# Patient Record
Sex: Male | Born: 1945 | Race: White | Hispanic: No | Marital: Married | Smoking: Never smoker
Health system: Southern US, Community
[De-identification: ages and names within clinical notes are randomized; demographics above are authoritative.]

## PROBLEM LIST (undated history)

## (undated) DIAGNOSIS — F419 Anxiety disorder, unspecified: Secondary | ICD-10-CM

## (undated) DIAGNOSIS — R200 Anesthesia of skin: Secondary | ICD-10-CM

## (undated) DIAGNOSIS — N529 Male erectile dysfunction, unspecified: Secondary | ICD-10-CM

## (undated) DIAGNOSIS — I10 Essential (primary) hypertension: Secondary | ICD-10-CM

## (undated) DIAGNOSIS — K449 Diaphragmatic hernia without obstruction or gangrene: Secondary | ICD-10-CM

## (undated) DIAGNOSIS — I2510 Atherosclerotic heart disease of native coronary artery without angina pectoris: Secondary | ICD-10-CM

## (undated) DIAGNOSIS — E785 Hyperlipidemia, unspecified: Secondary | ICD-10-CM

## (undated) DIAGNOSIS — M1990 Unspecified osteoarthritis, unspecified site: Secondary | ICD-10-CM

## (undated) HISTORY — DX: Hyperlipidemia, unspecified: E78.5

## (undated) HISTORY — DX: Unspecified osteoarthritis, unspecified site: M19.90

## (undated) HISTORY — DX: Atherosclerotic heart disease of native coronary artery without angina pectoris: I25.10

## (undated) HISTORY — DX: Anesthesia of skin: R20.0

## (undated) HISTORY — DX: Essential (primary) hypertension: I10

## (undated) HISTORY — PX: CARDIAC CATHETERIZATION: SHX172

## (undated) HISTORY — PX: NOSE SURGERY: SHX723

## (undated) HISTORY — DX: Diaphragmatic hernia without obstruction or gangrene: K44.9

## (undated) HISTORY — DX: Male erectile dysfunction, unspecified: N52.9

## (undated) HISTORY — DX: Anxiety disorder, unspecified: F41.9

---

## 1999-01-19 ENCOUNTER — Ambulatory Visit (HOSPITAL_COMMUNITY): Admission: RE | Admit: 1999-01-19 | Discharge: 1999-01-20 | Payer: Self-pay | Admitting: Cardiovascular Disease

## 1999-02-11 ENCOUNTER — Inpatient Hospital Stay (HOSPITAL_COMMUNITY): Admission: EM | Admit: 1999-02-11 | Discharge: 1999-02-12 | Payer: Self-pay | Admitting: Cardiology

## 2003-12-19 ENCOUNTER — Ambulatory Visit (HOSPITAL_COMMUNITY): Admission: RE | Admit: 2003-12-19 | Discharge: 2003-12-19 | Payer: Self-pay | Admitting: Cardiology

## 2004-01-19 ENCOUNTER — Ambulatory Visit (HOSPITAL_COMMUNITY): Admission: RE | Admit: 2004-01-19 | Discharge: 2004-01-19 | Payer: Self-pay | Admitting: Cardiology

## 2004-06-07 ENCOUNTER — Ambulatory Visit: Payer: Self-pay | Admitting: Internal Medicine

## 2005-01-12 ENCOUNTER — Ambulatory Visit: Payer: Self-pay | Admitting: Cardiology

## 2006-01-13 ENCOUNTER — Ambulatory Visit: Payer: Self-pay | Admitting: Cardiology

## 2006-11-30 ENCOUNTER — Ambulatory Visit: Payer: Self-pay | Admitting: Cardiology

## 2006-11-30 LAB — CONVERTED CEMR LAB
ALT: 21 units/L (ref 0–53)
AST: 23 units/L (ref 0–37)
Albumin: 3.9 g/dL (ref 3.5–5.2)
Alkaline Phosphatase: 70 units/L (ref 39–117)
Bilirubin, Direct: 0.2 mg/dL (ref 0.0–0.3)
Cholesterol: 146 mg/dL (ref 0–200)
HDL: 42.8 mg/dL (ref >39.0)
LDL Cholesterol: 75 mg/dL (ref 0–99)
Total Bilirubin: 1.5 mg/dL — ABNORMAL HIGH (ref 0.3–1.2)
Total CHOL/HDL Ratio: 3.4
Total Protein: 6.5 g/dL (ref 6.0–8.3)
Triglycerides: 142 mg/dL (ref 0–149)
VLDL: 28 mg/dL (ref 0–40)

## 2007-01-15 ENCOUNTER — Encounter: Admission: RE | Admit: 2007-01-15 | Discharge: 2007-01-15 | Payer: Self-pay | Admitting: Internal Medicine

## 2007-01-15 ENCOUNTER — Ambulatory Visit: Payer: Self-pay | Admitting: Cardiology

## 2008-01-25 ENCOUNTER — Ambulatory Visit: Payer: Self-pay | Admitting: Cardiology

## 2008-02-01 ENCOUNTER — Ambulatory Visit: Payer: Self-pay

## 2009-01-28 DIAGNOSIS — E785 Hyperlipidemia, unspecified: Secondary | ICD-10-CM | POA: Insufficient documentation

## 2009-01-28 DIAGNOSIS — I10 Essential (primary) hypertension: Secondary | ICD-10-CM | POA: Insufficient documentation

## 2009-01-28 DIAGNOSIS — I2510 Atherosclerotic heart disease of native coronary artery without angina pectoris: Secondary | ICD-10-CM | POA: Insufficient documentation

## 2009-01-28 DIAGNOSIS — K449 Diaphragmatic hernia without obstruction or gangrene: Secondary | ICD-10-CM | POA: Insufficient documentation

## 2009-01-30 ENCOUNTER — Ambulatory Visit: Payer: Self-pay | Admitting: Cardiology

## 2009-01-30 DIAGNOSIS — R079 Chest pain, unspecified: Secondary | ICD-10-CM | POA: Insufficient documentation

## 2009-02-20 ENCOUNTER — Ambulatory Visit: Payer: Self-pay

## 2009-02-20 ENCOUNTER — Encounter: Payer: Self-pay | Admitting: Cardiology

## 2009-02-20 ENCOUNTER — Ambulatory Visit (HOSPITAL_COMMUNITY): Admission: RE | Admit: 2009-02-20 | Discharge: 2009-02-20 | Payer: Self-pay | Admitting: Cardiology

## 2009-02-20 ENCOUNTER — Ambulatory Visit: Payer: Self-pay | Admitting: Cardiology

## 2009-03-13 ENCOUNTER — Encounter (INDEPENDENT_AMBULATORY_CARE_PROVIDER_SITE_OTHER): Payer: Self-pay | Admitting: *Deleted

## 2009-03-13 ENCOUNTER — Ambulatory Visit: Payer: Self-pay | Admitting: Cardiology

## 2009-03-13 LAB — CONVERTED CEMR LAB
ALT: 22 units/L (ref 0–53)
AST: 20 units/L (ref 0–37)
Albumin: 3.9 g/dL (ref 3.5–5.2)
Alkaline Phosphatase: 78 units/L (ref 39–117)
Bilirubin, Direct: 0.3 mg/dL (ref 0.0–0.3)
Cholesterol: 120 mg/dL (ref 0–200)
HDL: 42.5 mg/dL (ref >39.00)
LDL Cholesterol: 50 mg/dL (ref 0–99)
Total Bilirubin: 1.7 mg/dL — ABNORMAL HIGH (ref 0.3–1.2)
Total CHOL/HDL Ratio: 3
Total Protein: 6.6 g/dL (ref 6.0–8.3)
Triglycerides: 140 mg/dL (ref 0.0–149.0)
VLDL: 28 mg/dL (ref 0.0–40.0)

## 2009-04-29 ENCOUNTER — Ambulatory Visit: Payer: Self-pay | Admitting: Cardiology

## 2009-07-09 ENCOUNTER — Encounter: Payer: Self-pay | Admitting: Cardiology

## 2010-04-27 ENCOUNTER — Encounter: Payer: Self-pay | Admitting: Cardiology

## 2010-04-27 ENCOUNTER — Ambulatory Visit: Payer: Self-pay | Admitting: Cardiology

## 2010-06-10 NOTE — Assessment & Plan Note (Signed)
Summary: F1Y/DM   Visit Type:  Follow-up   History of Present Illness: Jeremy Simon is a very pleasant gentleman who has a history of coronary artery disease.  He has had a previous PCI of his LAD in 2000.  His most recent catheterization performed in August 2005 showed nonobstructive disease. Last Myoview was performed on February 01, 2008. This revealed a positive electrocardiographic response and mild ischemia in the inferolateral wall. I did review this and felt it was low risk. Stress echocardiogram was performed on February 20, 2009. There was no chest pain. There were electrocardiographic changes but the echo images revealed no ischemia. I last saw him in December of 2010. Since then, the patient denies any dyspnea on exertion, orthopnea, PND, pedal edema, palpitations, syncope or chest pain.   Current Medications (verified): 1)  Aspirin Ec 325 Mg Tbec (Aspirin) .... Take One Tablet By Mouth Daily 2)  Ramipril 10 Mg Caps (Ramipril) .... Take One Capsule By Mouth Daily 3)  Lipitor 80 Mg Tabs (Atorvastatin Calcium) .... Take One Tablet By Mouth Daily. 4)  Metoprolol Succinate 50 Mg Xr24h-Tab (Metoprolol Succinate) .... 1/2 Tab By Mouth Once Daily 5)  Niaspan 1000 Mg Cr-Tabs (Niacin (Antihyperlipidemic)) .... Tab By Mouth Once Daily 6)  Avodart 0.5 Mg Caps (Dutasteride) .Marland Kitchen.. 1 Tab By Mouth Once Daily  Allergies (verified): No Known Drug Allergies  Past History:  Past Medical History: HIATAL HERNIA (ICD-553.3) HYPERLIPIDEMIA (ICD-272.4) HYPERTENSION (ICD-401.9) CAD (ICD-414.00)  Social History: Reviewed history from 01/30/2009 and no changes required. Tobacco Use - No.   Review of Systems       no fevers or chills, productive cough, hemoptysis, dysphasia, odynophagia, melena, hematochezia, dysuria, hematuria, rash, seizure activity, orthopnea, PND, pedal edema, claudication. Remaining systems are negative.   Vital Signs:  Patient profile:   65 year old male Height:      73  inches Weight:      187 pounds BMI:     24.76 Pulse rate:   64 / minute Pulse rhythm:   regular BP sitting:   110 / 68  (right arm)  Vitals Entered By: Jacquelin Hawking, CMA (April 27, 2010 12:22 PM)  Physical Exam  General:  Well-developed well-nourished in no acute distress.  Skin is warm and dry.  HEENT is normal.  Neck is supple. No thyromegaly.  Chest is clear to auscultation with normal expansion.  Cardiovascular exam is regular rate and rhythm.  Abdominal exam nontender or distended. No masses palpated. Extremities show no edema. neuro grossly intact    EKG  Procedure date:  04/27/2010  Findings:      Sinus rhythm with no ST changes.  Impression & Recommendations:  Problem # 1:  HYPERLIPIDEMIA (ICD-272.4)  Continue present medications. Lipids and liver monitored by primary care. His updated medication list for this problem includes:    Lipitor 80 Mg Tabs (Atorvastatin calcium) .Marland Kitchen... Take one tablet by mouth daily.    Niaspan 1000 Mg Cr-tabs (Niacin (antihyperlipidemic)) .Marland Kitchen... Tab by mouth once daily  His updated medication list for this problem includes:    Lipitor 80 Mg Tabs (Atorvastatin calcium) .Marland Kitchen... Take one tablet by mouth daily.    Niaspan 1000 Mg Cr-tabs (Niacin (antihyperlipidemic)) .Marland Kitchen... Tab by mouth once daily  Problem # 2:  HYPERTENSION (ICD-401.9) Blood pressure controlled on present medications. Will continue. Potassium and renal function monitored by primary care. His updated medication list for this problem includes:    Aspirin Ec 325 Mg Tbec (Aspirin) .Marland Kitchen... Take one tablet by  mouth daily    Ramipril 10 Mg Caps (Ramipril) .Marland Kitchen... Take one capsule by mouth daily    Metoprolol Succinate 50 Mg Xr24h-tab (Metoprolol succinate) .Marland Kitchen... 1/2 tab by mouth once daily  His updated medication list for this problem includes:    Aspirin Ec 325 Mg Tbec (Aspirin) .Marland Kitchen... Take one tablet by mouth daily    Ramipril 10 Mg Caps (Ramipril) .Marland Kitchen... Take one capsule by  mouth daily    Metoprolol Succinate 50 Mg Xr24h-tab (Metoprolol succinate) .Marland Kitchen... 1/2 tab by mouth once daily  Problem # 3:  CAD (ICD-414.00) Continue aspirin, ACE inhibitor, beta blocker and statin. Last functional study showed no ischemia. Continue risk factor modification. His updated medication list for this problem includes:    Aspirin Ec 325 Mg Tbec (Aspirin) .Marland Kitchen... Take one tablet by mouth daily    Ramipril 10 Mg Caps (Ramipril) .Marland Kitchen... Take one capsule by mouth daily    Metoprolol Succinate 50 Mg Xr24h-tab (Metoprolol succinate) .Marland Kitchen... 1/2 tab by mouth once daily  Orders: EKG w/ Interpretation (93000)  Patient Instructions: 1)  Your physician recommends that you schedule a follow-up appointment in: YEAR WITH DR CRENSHAW 2)  Your physician recommends that you continue on your current medications as directed. Please refer to the Current Medication list given to you today. Prescriptions: NIASPAN 1000 MG CR-TABS (NIACIN (ANTIHYPERLIPIDEMIC)) tab by mouth once daily  #90 x 4   Entered by:   Scherrie Bateman, LPN   Authorized by:   Ferman Hamming, MD, [facility]   Signed by:   Scherrie Bateman, LPN on 16/02/9603   Method used:   Print then Give to Patient   RxID:   [id number] METOPROLOL SUCCINATE 50 MG XR24H-TAB (METOPROLOL SUCCINATE) 1/2 tab by mouth once daily  #90 x 4   Entered by:   Scherrie Bateman, LPN   Authorized by:   Ferman Hamming, MD, [facility]   Signed by:   Scherrie Bateman, LPN on 21/30/8657   Method used:   Print then Give to Patient   RxID:   [id number] LIPITOR 80 MG TABS (ATORVASTATIN CALCIUM) Take one tablet by mouth daily.  #90 x 12   Entered by:   Scherrie Bateman, LPN   Authorized by:   Ferman Hamming, MD, [facility]   Signed by:   Scherrie Bateman, LPN on 05/11/7251   Method used:   Print then Give to Patient   RxID:   [id number] RAMIPRIL 10 MG CAPS (RAMIPRIL) Take one capsule by mouth daily  #90 x 4   Entered by:   Scherrie Bateman, LPN    Authorized by:   Ferman Hamming, MD, [facility]   Signed by:   Scherrie Bateman, LPN on 75/64/3329   Method used:   Print then Give to Patient   RxID:   [id number]

## 2010-08-12 ENCOUNTER — Encounter: Payer: Self-pay | Admitting: Cardiology

## 2010-09-06 ENCOUNTER — Encounter: Payer: Self-pay | Admitting: Cardiology

## 2010-09-21 NOTE — Assessment & Plan Note (Signed)
[facility]                            CARDIOLOGY OFFICE NOTE   KOLDEN, DUPEE                     MRN:          [id number]  DATE:01/25/2008                            DOB:          11-11-1945    HISTORY OF PRESENT ILLNESS:  Mr. Klemz is a very pleasant gentleman  who has a history of coronary artery disease.  He has had a previous PCI  of his LAD in 2000.  His most recent catheterization performed in August  2005 showed nonobstructive disease.  Since I last saw him, he is doing  well with no dyspnea, chest pain, palpitations, or syncope.  There is no  pedal edema.   MEDICATIONS:  1. Toprol 25 mg p.o. daily.  2. Altace 10 mg p.o. daily.  3. Aspirin 325 mg p.o. daily.  4. Lipitor 40 mg p.o. daily.  5. Niaspan 1 g p.o. daily.  6. Flomax.  7. Omega-3 fish oil.   PHYSICAL EXAMINATION:  VITAL SIGNS:  Blood pressure 138/71 and his pulse  is 59.  He weighs 190 pounds.  HEENT:  Normal.  NECK:  Supple with no bruits.  CHEST:  Clear.  CARDIOVASCULAR:  Regular rhythm.  ABDOMEN:  No tenderness.  EXTREMITIES:  No edema.   His electrocardiogram shows a sinus rhythm at a rate of 56.  There are  no ST changes noted.   DIAGNOSES:  1. Coronary artery disease - Mr. Squier is doing well from a      symptomatic standpoint.  However, it has been 5 years since his      most recent stress test and we will plan to repeat his Myoview.  If      it shows no ischemia, we will not pursue further evaluation.  He      will continue on his aspirin, ACE inhibitor, beta blocker, and      statin.  He also continues with risk factor modification including      diet and exercise.  He does not smoke.  2. Hypertension - His blood pressure is adequately controlled on his      present medications.  3. Hyperlipidemia - He will continue on the statin.  His lipids and      liver are being followed by his primary care physician as well as      his renal function.  4.  History of hiatal hernia.   He will see [place] back in 1 year.     Madolyn Frieze Jens Som, MD, [facility]  Electronically Signed    BSC/MedQ  DD: 01/25/2008  DT: 01/26/2008  Job #: [id number]

## 2010-09-21 NOTE — Assessment & Plan Note (Signed)
Jeremy Simon                            CARDIOLOGY OFFICE NOTE   Jeremy Simon, Jeremy Simon Jeremy Simon                     MRN:          [id number]  DATE:01/15/2007                            DOB:          04-17-1946    Jeremy Simon is a very pleasant gentleman who has a history of coronary  disease status post PCI.  His most recent cardiac catheterization was  performed on December 19, 2003.  At that time he was found to have  nonobstructive coronary disease.  Since I last saw him he is doing  extremely well.  There is no dyspnea on exertion, orthopnea, PND, pedal  edema, palpitations, presyncope, syncope or chest pain.  He is  exercising five times a week and following the diet.  Note he does not  smoke.   His medications include:  1. Toprol-XL 50-mg tablets, one-half p.o. daily.  2. Altace 10 mg p.o. daily.  3. Aspirin 325 mg p.o. daily.  4. Folate 800 mg p.o. daily.  5. Saw palmetto.  6. Lipitor 40 mg p.o. daily.  7. Niaspan 2.5 g p.o. daily.   PHYSICAL EXAMINATION TODAY:  Shows a blood pressure of 130/76 and his  pulse is 51.  He weighs 182 pounds.  HEENT:  Normal.  NECK:  Supple with no bruits.  CHEST:  Clear.  CARDIOVASCULAR:  Reveals a bradycardic rate but a regular rhythm.  ABDOMEN:  Benign.  EXTREMITIES:  Show no edema.   I do have an electrocardiogram dated January 15, 2007, that shows a  sinus bradycardia at a rate of 51.  The axis is normal.  There are no ST  changes noted.   DIAGNOSES:  1. Coronary artery disease - Jeremy Simon is doing extremely well from      a symptomatic standpoint.  He has not had chest pain or shortness      of breath.  We will continue with his beta blocker, ACE inhibitor,      aspirin and statin.  2. Hypertension - his blood pressure is well controlled on his present      medications.  3. Hyperlipidemia - his most recent LDL was 75.  We discussed      increasing his Lipitor to 80 but he would prefer not to do that  at      this point.  We will continue with his statin and Niaspan.  Note he      had lipids and liver drawn on November 30, 2006, and his liver      functions were normal.  4. History of hiatal hernia.   He will continue with his risk factor modification including diet and  exercise and I will see him back in 1 year.     Madolyn Frieze Jens Som, MD, [facility]  Electronically Signed    BSC/MedQ  DD: 01/15/2007  DT: 01/16/2007  Job #: [id number]

## 2010-09-24 NOTE — Cardiovascular Report (Signed)
NAMEVALERIA, Jeremy Simon                          ACCOUNT NO.:  [id number]   MEDICAL RECORD NO.:  [id number]                   PATIENT TYPE:  OIB   LOCATION:  2870                                 FACILITY:  [facility]   PHYSICIAN:  Rollene Rotunda, M.D.                DATE OF BIRTH:  20-Jul-1945   DATE OF PROCEDURE:  12/19/2003  DATE OF DISCHARGE:                              CARDIAC CATHETERIZATION   PRIMARY CARE PHYSICIAN:  Titus Dubin. Alwyn Ren, M.D.   PROCEDURE:  Left heart catheterization/coronary arteriography.   INDICATIONS:  Patient with previous stenting of his LAD, chest pain, and a  Cardiolite suggesting inferior ischemia.   PROCEDURE NOTE:  Left heart catheterization was performed via the right  femoral artery.  The artery was cannulated, using anterior wall puncture.  A  #6 French arterial sheath was inserted via the modified Seldinger technique.  Preformed Judkins and a pigtail catheter were utilized.  The patient  tolerated the procedure well and left the lab in stable condition.   RESULTS:  Hemodynamics:  LV 133/14, AO 125/92.   Coronaries:  The left main was normal.  The LAD had proximal calcification.  There was proximal long 25% stenosis.  There was a mid stent which had some  end-stent luminal irregularities.  The LAD was a very large vessel wrapping  the apex.  There was a small first diagonal, which was normal, and a  moderate-sized second diagonal and third diagonal, which were both normal.  The circumflex and the AV groove was normal.  There was a large first obtuse  marginal, which was normal.  There was a large second obtuse marginal which  had proximal 25% stenosis.  The right coronary artery was a dominant vessel.  There was a mid 30% stenosis.  The vessel terminated was a moderate-sized  PDA, which was free of significant disease in two posterolaterals.   The left ventriculogram was obtained in the RAO projection.  The EF was 65%  with normal wall motion.   CONCLUSION:  Nonobstructive coronary disease.  Well-preserved ejection  fraction.   PLAN:  The patient will have continued secondary to risk reduction.                                               Rollene Rotunda, M.D.    JH/MEDQ  D:  12/19/2003  T:  12/20/2003  Job:  [id number]   cc:   Titus Dubin. Alwyn Ren, M.D. [facility]

## 2010-09-24 NOTE — Discharge Summary (Signed)
[place]  Patient:    Jeremy Simon                        MRN: [id number] Adm. Date:  [id number] Disc. Date: 02/12/99 Attending:  Nelta Numbers Dictator:   Delton See, P.A.                           Discharge Summary  HISTORY OF PRESENT ILLNESS:  This is a 65 year old male, with a history of a stent to the mid-LAD approximately three weeks prior to this admission, who has had atypical chest pain since the procedure.  He was admitted to [facility] on February 11, 1999, by Dr. Noralyn Pick. Nishan, for further evaluation of is pain.  He had been on Plavix since the procedure.  PAST MEDICAL HISTORY:  Significant for the above-noted angioplasty.  ALLERGIES:  No known drug allergies.  HOSPITAL COURSE:  As noted, this patient was admitted to [facility] by Dr. Eden Emms on February 11, 1999.  He underwent a cardiac catheterization on the day of admission, performed by Dr. Arturo Morton. Stuckey.  The patient was found to have patent stents.  There was proximal LAD diffuse disease of 50%-70%. There was a 70% distal circumflex, and a 50% RCA lesion, as well as some other smaller lesions.  The situation was discussed with Dr. Madolyn Frieze. Crenshaw, and medical treatment was felt to be indicated.  The patient had recently had a negative Cardiolite on February 10, 1999.  DISPOSITION:  Arrangements were made to discharge the patient on the following ay, in an improved condition.  Imdur was added to his medications prior to discharge.  LABORATORY DATA:  From [facility] performed on the day of admission revealed a hemoglobin of 14.2, hematocrit 40.3, wbcs 5900, platelets 186,000.  A PT and  PTT were within normal limits.  A chemistry profile was within normal limits except  for a glucose of 114.  CPK-MB and troponin enzymes were negative.  DISCHARGE MEDICATIONS: 1. Imdur 30 mg one q.d. 2. Altace 5 mg  q.d. 3. Plavix 75 mg q.d. 4. _______ 20 mEq q.d. 5. Coated aspirin 325 mg q.d. 6. Zocor 40 mg q.d. 7. Toprol XL 25 mg q.d. 8. Nitroglycerin p.r.n. chest pain.  These were the same medications that the patient was on at the time of admission except for the Imdur.  INSTRUCTIONS:  The patient was told to avoid any strenuous activity, and no driving for at least two days.  He is to call the increased pain, swelling, or bleeding  from his groin.  DIET:  He is to be on a low-salt, low-fat diet.  FOLLOWUP:  He is to follow up with Dr. Madolyn Frieze. Crenshaw on February 26, 1999, at 11 a.m., and with Dr. Titus Dubin. Hopper, as needed or as scheduled.  DISCHARGE DIAGNOSES: 1. Chest pain with negative cardiac enzymes. 2. Cardiac catheterization on February 11, 1999, revealing patent stents,    but diffuse coronary artery disease, to be treated medically. 3. History of percutaneous transluminal coronary angioplasty and stent    to the left anterior descending coronary artery in September 2000. 4. Cardiolite on February 10, 1999, revealing no ischemia, with a normal    ejection fraction. DD:  02/12/99 TD:  02/12/99 Job: [id number]

## 2010-09-24 NOTE — Assessment & Plan Note (Signed)
[facility]                              CARDIOLOGY OFFICE NOTE   Carrel, Leather JYMIR DUNAJ                     MRN:          [id number]  DATE:01/13/2006                            DOB:          1946-03-20    Mr. Nagengast is a gentleman who has a history of coronary disease status post  PCI.  Since I last saw him, he denies any dyspnea, chest pain, palpitations,  or syncope.   His medications at present include:  1. Toprol 25 mg p.o. daily.  2. Altace 10 mg p.o. daily.  3. Aspirin 325 mg p.o. daily.  4. Folate.  5. Saw palmetto.  6. Vitamin E.  7. Lipitor 40 mg p.o. daily.   PHYSICAL EXAM TODAY:  VITAL SIGNS:  Blood pressure 118/82.  Pulse 56.  NECK:  Supple and there are no bruits.  CHEST:  Clear.  CARDIOVASCULAR:  Reveals regular rate and rhythm.  ABDOMINAL EXAM:  Shows no pulsatile masses and no bruits.  EXTREMITIES:  Show no edema.   Electrocardiogram shows a sinus rhythm at a rate of 56.  There are no ST  changes noted.   DIAGNOSES:  1. Coronary artery disease.  2. Hypertension.  3. Hyperlipidemia.  4. History of hiatal hernia.   PLAN:  Mr. Gavitt continues to do well from a symptomatic stand point.  His  blood pressure is well controlled.  He had recent lipids checked back in  February that showed an LDL of 90.  We discussed increasing his Lipitor to  80 mg p.o. q.day but he declined this.  He would prefer diet.  He is to have  lipids repeated again in February and if he remains greater than 70, he  would be agreeable at that time to increase to 80 mg.  He will, otherwise,  continue with diet and exercise.  He does not smoke.  He will see [place] back in  12 months.                             Madolyn Frieze Jens Som, MD, [facility]    BSC/MedQ  DD:  01/13/2006  DT:  01/14/2006  Job #:  [id number]

## 2010-09-29 ENCOUNTER — Encounter: Payer: Self-pay | Admitting: Cardiology

## 2011-02-02 ENCOUNTER — Encounter: Payer: Self-pay | Admitting: Cardiology

## 2011-04-15 ENCOUNTER — Encounter: Payer: Self-pay | Admitting: Cardiology

## 2011-04-15 ENCOUNTER — Encounter: Payer: Self-pay | Admitting: *Deleted

## 2011-04-18 ENCOUNTER — Encounter: Payer: Self-pay | Admitting: Cardiology

## 2011-04-18 ENCOUNTER — Ambulatory Visit (INDEPENDENT_AMBULATORY_CARE_PROVIDER_SITE_OTHER): Payer: Self-pay | Admitting: Cardiology

## 2011-04-18 DIAGNOSIS — E785 Hyperlipidemia, unspecified: Secondary | ICD-10-CM

## 2011-04-18 DIAGNOSIS — I10 Essential (primary) hypertension: Secondary | ICD-10-CM

## 2011-04-18 DIAGNOSIS — I2510 Atherosclerotic heart disease of native coronary artery without angina pectoris: Secondary | ICD-10-CM

## 2011-04-18 MED ORDER — NIACIN ER (ANTIHYPERLIPIDEMIC) 1000 MG PO TBCR: 1000.0000 mg | EXTENDED_RELEASE_TABLET | Freq: Every day | ORAL | Status: DC

## 2011-04-18 MED ORDER — ATORVASTATIN CALCIUM 40 MG PO TABS: 80.0000 mg | ORAL_TABLET | Freq: Every day | ORAL | Status: DC

## 2011-04-18 MED ORDER — METOPROLOL SUCCINATE ER 25 MG PO TB24: 25.0000 mg | ORAL_TABLET | Freq: Every day | ORAL | Status: DC

## 2011-04-18 MED ORDER — RAMIPRIL 10 MG PO CAPS: 10.0000 mg | ORAL_CAPSULE | Freq: Every day | ORAL | Status: DC

## 2011-04-18 NOTE — Progress Notes (Signed)
HPI:Jeremy Simon is a very pleasant gentleman who has a history of coronary artery disease.  He has had a previous PCI of his LAD in 2000.  His most recent catheterization performed in August 2005 showed nonobstructive disease. Last Myoview was performed on February 01, 2008. This revealed a positive electrocardiographic response and mild ischemia in the inferolateral wall. I did review this and felt it was low risk. Stress echocardiogram was performed on February 20, 2009. There was no chest pain. There were electrocardiographic changes but the echo images revealed no ischemia. I last saw him in December of 2011. Since then, the patient denies any dyspnea on exertion, orthopnea, PND, pedal edema, palpitations, syncope or chest pain.  Current Outpatient Prescriptions  Medication Sig Dispense Refill  . aspirin 325 MG tablet Take 325 mg by mouth daily.        . ASTEPRO 0.15 % SOLN As directred.      Marland Kitchen atorvastatin (LIPITOR) 40 MG tablet Take 80 mg by mouth daily.       . Cholecalciferol ([employer] VIT D 5000 HIGH-POTENCY) 5000 UNITS capsule Take 5,000 Units by mouth daily.        . finasteride (PROSCAR) 5 MG tablet Take 5 mg by mouth daily.       . metoprolol succinate (TOPROL-XL) 25 MG 24 hr tablet Take 25 mg by mouth daily.        . ramipril (ALTACE) 10 MG capsule Take 10 mg by mouth daily.        Marland Kitchen ROZEREM 8 MG tablet Take 8 mg by mouth at bedtime.       Marland Kitchen VIAGRA 100 MG tablet          Past Medical History  Diagnosis Date  . HYPERTENSION   . HYPERLIPIDEMIA   . CAD   . HIATAL HERNIA     Past Surgical History  Procedure Date  . Cardiac catheterization     History   Social History  . Marital Status: Married    Spouse Name: N/A    Number of Children: N/A  . Years of Education: N/A   Occupational History  . Not on file.   Social History Main Topics  . Smoking status: Never Smoker   . Smokeless tobacco: Not on file  . Alcohol Use: Not on file  . Drug Use: Not on file  . Sexually  Active: Not on file   Other Topics Concern  . Not on file   Social History Narrative  . No narrative on file    ROS: no fevers or chills, productive cough, hemoptysis, dysphasia, odynophagia, melena, hematochezia, dysuria, hematuria, rash, seizure activity, orthopnea, PND, pedal edema, claudication. Remaining systems are negative.  Physical Exam: Well-developed well-nourished in no acute distress.  Skin is warm and dry.  HEENT is normal.  Neck is supple. No thyromegaly.  Chest is clear to auscultation with normal expansion.  Cardiovascular exam is regular rate and rhythm.  Abdominal exam nontender or distended. No masses palpated. Extremities show no edema. neuro grossly intact  ECG NSR with no ST changes

## 2011-04-18 NOTE — Assessment & Plan Note (Signed)
Continue statin. Lipids and liver monitored by primary care. 

## 2011-04-18 NOTE — Assessment & Plan Note (Signed)
Blood pressure controlled. Continue present medications. Potassium and renal function monitored by primary care. 

## 2011-04-18 NOTE — Patient Instructions (Addendum)
Your physician has requested that you have an echocardiogram. Echocardiography is a painless test that uses sound waves to create images of your heart. It provides your doctor with information about the size and shape of your heart and how well your heart's chambers and valves are working. This procedure takes approximately one hour. There are no restrictions for this procedure.  Your physician wants you to follow-up in: 12 months You will receive a reminder letter in the mail two months in advance. If you don't receive a letter, please call our office to schedule the follow-up appointment.  

## 2011-04-18 NOTE — Assessment & Plan Note (Signed)
Continue aspirin and statin. Schedule stress echocardiogram for stratification.

## 2011-04-21 ENCOUNTER — Other Ambulatory Visit (HOSPITAL_COMMUNITY): Payer: BC Managed Care – PPO | Admitting: Radiology

## 2011-04-28 ENCOUNTER — Ambulatory Visit (HOSPITAL_BASED_OUTPATIENT_CLINIC_OR_DEPARTMENT_OTHER): Payer: BC Managed Care – PPO | Admitting: Radiology

## 2011-04-28 ENCOUNTER — Other Ambulatory Visit: Payer: Self-pay | Admitting: Cardiology

## 2011-04-28 ENCOUNTER — Telehealth: Payer: Self-pay | Admitting: Cardiology

## 2011-04-28 ENCOUNTER — Ambulatory Visit (HOSPITAL_COMMUNITY): Payer: BC Managed Care – PPO | Attending: Cardiology | Admitting: Radiology

## 2011-04-28 DIAGNOSIS — R0989 Other specified symptoms and signs involving the circulatory and respiratory systems: Secondary | ICD-10-CM

## 2011-04-28 DIAGNOSIS — E785 Hyperlipidemia, unspecified: Secondary | ICD-10-CM

## 2011-04-28 DIAGNOSIS — R0789 Other chest pain: Secondary | ICD-10-CM | POA: Insufficient documentation

## 2011-04-28 DIAGNOSIS — I10 Essential (primary) hypertension: Secondary | ICD-10-CM

## 2011-04-28 DIAGNOSIS — I2510 Atherosclerotic heart disease of native coronary artery without angina pectoris: Secondary | ICD-10-CM | POA: Insufficient documentation

## 2011-04-28 MED ORDER — ATORVASTATIN CALCIUM 80 MG PO TABS: 80.0000 mg | ORAL_TABLET | Freq: Every day | ORAL | Status: DC

## 2011-04-28 NOTE — Telephone Encounter (Signed)
New RX sent to [facility] per pt request.

## 2011-04-28 NOTE — Telephone Encounter (Signed)
Pt calling re getting an rx corrected, brought by this am, requesting call

## 2011-08-02 ENCOUNTER — Encounter: Payer: Self-pay | Admitting: Cardiology

## 2012-03-04 ENCOUNTER — Other Ambulatory Visit: Payer: Self-pay | Admitting: Cardiology

## 2012-03-07 ENCOUNTER — Other Ambulatory Visit: Payer: Self-pay | Admitting: Cardiology

## 2012-04-23 ENCOUNTER — Encounter: Payer: Self-pay | Admitting: Cardiology

## 2012-04-23 ENCOUNTER — Ambulatory Visit (INDEPENDENT_AMBULATORY_CARE_PROVIDER_SITE_OTHER): Payer: BC Managed Care – PPO | Admitting: Cardiology

## 2012-04-23 VITALS — BP 130/76 | HR 60 | Ht 72.0 in | Wt 184.0 lb

## 2012-04-23 DIAGNOSIS — E785 Hyperlipidemia, unspecified: Secondary | ICD-10-CM

## 2012-04-23 DIAGNOSIS — I10 Essential (primary) hypertension: Secondary | ICD-10-CM

## 2012-04-23 DIAGNOSIS — I2510 Atherosclerotic heart disease of native coronary artery without angina pectoris: Secondary | ICD-10-CM

## 2012-04-23 NOTE — Assessment & Plan Note (Signed)
Continue aspirin and statin. Last functional study showed no ischemia on echocardiographic images. Continue risk factor modification.

## 2012-04-23 NOTE — Patient Instructions (Addendum)
Your physician wants you to follow-up in:  12 months.  You will receive a reminder letter in the mail two months in advance. If you don't receive a letter, please call our office to schedule the follow-up appointment.   

## 2012-04-23 NOTE — Progress Notes (Signed)
   HPI: Jeremy Simon is a very pleasant gentleman who has a history of coronary artery disease. He has had a previous PCI of his LAD in 2000. His most recent catheterization performed in August 2005 showed nonobstructive disease. Last functional study was performed in December of 2012. A stress echocardiogram revealed chest tightness and electrocardiographic changes which had been seen on previous functional studies. There were no stress-induced wall motion abnormalities. I last saw him in December of 2012. Since then, the patient denies any dyspnea on exertion, orthopnea, PND, pedal edema, palpitations, syncope or chest pain.   Current Outpatient Prescriptions  Medication Sig Dispense Refill  . aspirin 325 MG tablet Take 325 mg by mouth daily.        . ASTEPRO 0.15 % SOLN As directred.      Marland Kitchen atorvastatin (LIPITOR) 80 MG tablet TAKE 1 TABLET DAILY  90 tablet  2  . Cholecalciferol ([employer] VIT D 5000 HIGH-POTENCY) 5000 UNITS capsule Take 5,000 Units by mouth daily.        . finasteride (PROSCAR) 5 MG tablet Take 5 mg by mouth daily.       . fluticasone (FLONASE) 50 MCG/ACT nasal spray       . metoprolol succinate (TOPROL-XL) 25 MG 24 hr tablet TAKE 1 TABLET DAILY  90 tablet  2  . niacin (NIASPAN) 1000 MG CR tablet TAKE 1 TABLET AT BEDTIME  90 tablet  2  . ramipril (ALTACE) 10 MG capsule TAKE 1 CAPSULE DAILY  90 capsule  2  . Tamsulosin HCl (FLOMAX) 0.4 MG CAPS PRN      . VIAGRA 100 MG tablet          Past Medical History  Diagnosis Date  . HYPERTENSION   . HYPERLIPIDEMIA   . CAD   . HIATAL HERNIA     Past Surgical History  Procedure Date  . Cardiac catheterization     History   Social History  . Marital Status: Married    Spouse Name: N/A    Number of Children: N/A  . Years of Education: N/A   Occupational History  . Not on file.   Social History Main Topics  . Smoking status: Never Smoker   . Smokeless tobacco: Not on file  . Alcohol Use: Not on file  . Drug Use: Not on  file  . Sexually Active: Not on file   Other Topics Concern  . Not on file   Social History Narrative  . No narrative on file    ROS: no fevers or chills, productive cough, hemoptysis, dysphasia, odynophagia, melena, hematochezia, dysuria, hematuria, rash, seizure activity, orthopnea, PND, pedal edema, claudication. Remaining systems are negative.  Physical Exam: Well-developed well-nourished in no acute distress.  Skin is warm and dry.  HEENT is normal.  Neck is supple.  Chest is clear to auscultation with normal expansion.  Cardiovascular exam is regular rate and rhythm.  Abdominal exam nontender or distended. No masses palpated. Extremities show no edema. neuro grossly intact  ECG sinus rhythm at a rate of 60. No ST changes.

## 2012-04-23 NOTE — Assessment & Plan Note (Signed)
Continue statin. Lipids and liver monitored by primary care. 

## 2012-04-23 NOTE — Assessment & Plan Note (Signed)
Blood pressure controlled. Continue present medications. Potassium and renal function monitored by primary care. 

## 2012-11-17 ENCOUNTER — Other Ambulatory Visit: Payer: Self-pay | Admitting: Cardiology

## 2012-12-17 ENCOUNTER — Telehealth: Payer: Self-pay | Admitting: Cardiology

## 2012-12-17 NOTE — Telephone Encounter (Deleted)
Error

## 2012-12-18 ENCOUNTER — Other Ambulatory Visit: Payer: Self-pay

## 2012-12-18 MED ORDER — ATORVASTATIN CALCIUM 80 MG PO TABS: ORAL_TABLET | ORAL | Status: DC

## 2012-12-18 MED ORDER — METOPROLOL SUCCINATE ER 25 MG PO TB24: ORAL_TABLET | ORAL | Status: DC

## 2013-04-30 ENCOUNTER — Encounter: Payer: Self-pay | Admitting: Cardiology

## 2013-04-30 ENCOUNTER — Ambulatory Visit (INDEPENDENT_AMBULATORY_CARE_PROVIDER_SITE_OTHER): Payer: Medicare Other | Admitting: Cardiology

## 2013-04-30 VITALS — BP 130/76 | HR 55 | Ht 72.0 in | Wt 165.8 lb

## 2013-04-30 DIAGNOSIS — I2510 Atherosclerotic heart disease of native coronary artery without angina pectoris: Secondary | ICD-10-CM

## 2013-04-30 MED ORDER — ATORVASTATIN CALCIUM 80 MG PO TABS: ORAL_TABLET | ORAL | Status: DC

## 2013-04-30 MED ORDER — METOPROLOL SUCCINATE ER 25 MG PO TB24: ORAL_TABLET | ORAL | Status: DC

## 2013-04-30 MED ORDER — RAMIPRIL 10 MG PO CAPS: 10.0000 mg | ORAL_CAPSULE | Freq: Every day | ORAL | Status: DC

## 2013-04-30 NOTE — Patient Instructions (Signed)
Your physician wants you to follow-up in: ONE YEAR WITH DR Shelda Pal will receive a reminder letter in the mail two months in advance. If you don't receive a letter, please call our office to schedule the follow-up appointment.   STOP NIACIN

## 2013-04-30 NOTE — Progress Notes (Signed)
      HPI: Mr. Jeremy Simon is a very pleasant gentleman who has a history of coronary artery disease. He has had a previous PCI of his LAD in 2000. His most recent catheterization performed in August 2005 showed nonobstructive disease. Last functional study was performed in December of 2012. A stress echocardiogram revealed chest tightness and electrocardiographic changes which had been seen on previous functional studies. There were no stress-induced wall motion abnormalities. I last saw him in December of 2013. Since then, the patient denies any dyspnea on exertion, orthopnea, PND, pedal edema, palpitations, syncope or chest pain.   Current Outpatient Prescriptions  Medication Sig Dispense Refill  . aspirin 325 MG tablet Take 325 mg by mouth daily.        Marland Kitchen atorvastatin (LIPITOR) 80 MG tablet TAKE 1 TABLET DAILY  90 tablet  3  . Cholecalciferol (CVS VIT D 5000 HIGH-POTENCY) 5000 UNITS capsule Take 5,000 Units by mouth daily.        . finasteride (PROSCAR) 5 MG tablet Take 5 mg by mouth daily.       . fluticasone (FLONASE) 50 MCG/ACT nasal spray       . metoprolol succinate (TOPROL-XL) 25 MG 24 hr tablet TAKE 1 TABLET DAILY  90 tablet  3  . niacin (NIASPAN) 1000 MG CR tablet TAKE 1 TABLET AT BEDTIME  90 tablet  1  . ramipril (ALTACE) 10 MG capsule TAKE 1 CAPSULE DAILY  90 capsule  1  . Tamsulosin HCl (FLOMAX) 0.4 MG CAPS Take 0.4 mg by mouth daily. PRN      . VIAGRA 100 MG tablet        No current facility-administered medications for this visit.     Past Medical History  Diagnosis Date  . HYPERTENSION   . HYPERLIPIDEMIA   . CAD   . HIATAL HERNIA     Past Surgical History  Procedure Laterality Date  . Cardiac catheterization      History   Social History  . Marital Status: Married    Spouse Name: N/A    Number of Children: N/A  . Years of Education: N/A   Occupational History  . Not on file.   Social History Main Topics  . Smoking status: Never Smoker   . Smokeless  tobacco: Not on file  . Alcohol Use: Not on file  . Drug Use: Not on file  . Sexual Activity: Not on file   Other Topics Concern  . Not on file   Social History Narrative  . No narrative on file    ROS: Arthralgias but no fevers or chills, productive cough, hemoptysis, dysphasia, odynophagia, melena, hematochezia, dysuria, hematuria, rash, seizure activity, orthopnea, PND, pedal edema, claudication. Remaining systems are negative.  Physical Exam: Well-developed well-nourished in no acute distress.  Skin is warm and dry.  HEENT is normal.  Neck is supple.  Chest is clear to auscultation with normal expansion.  Cardiovascular exam is regular rate and rhythm.  Abdominal exam nontender or distended. No masses palpated. Extremities show no edema. neuro grossly intact  ECG sinus bradycardia with no ST changes.

## 2013-04-30 NOTE — Assessment & Plan Note (Signed)
Continue statin. Discontinue niacin. Lipids and liver monitored by primary care.

## 2013-04-30 NOTE — Assessment & Plan Note (Signed)
Blood pressure controlled. Continue present medications. Potassium and renal function monitored by primary care. 

## 2013-04-30 NOTE — Assessment & Plan Note (Signed)
Continue aspirin and statin. 

## 2014-04-21 NOTE — Progress Notes (Signed)
      HPI: FU coronary artery disease. He has had a previous PCI of his LAD in 2000. His most recent catheterization performed in August 2005 showed nonobstructive disease. Last functional study was performed in December of 2012. A stress echocardiogram revealed chest tightness and electrocardiographic changes which had been seen on previous functional studies. There were no stress-induced wall motion abnormalities. Since I last saw him, the patient denies any dyspnea on exertion, orthopnea, PND, pedal edema, palpitations, syncope or chest pain.   Current Outpatient Prescriptions  Medication Sig Dispense Refill  . aspirin 325 MG tablet Take 325 mg by mouth daily.      Marland Kitchen. atorvastatin (LIPITOR) 80 MG tablet TAKE 1 TABLET DAILY 90 tablet 3  . betamethasone dipropionate (DIPROLENE) 0.05 % cream Apply 1 application topically as needed.  3  . Cholecalciferol (CVS VIT D 5000 HIGH-POTENCY) 5000 UNITS capsule Take 5,000 Units by mouth daily.      Marland Kitchen. CIALIS 5 MG tablet Take 5 mg by mouth daily.  3  . fluticasone (FLONASE) 50 MCG/ACT nasal spray     . metoprolol succinate (TOPROL-XL) 25 MG 24 hr tablet TAKE 1 TABLET DAILY 90 tablet 3  . ramipril (ALTACE) 10 MG capsule Take 1 capsule (10 mg total) by mouth daily. 90 capsule 3  . Tamsulosin HCl (FLOMAX) 0.4 MG CAPS Take 0.8 mg by mouth daily. PRN     No current facility-administered medications for this visit.     Past Medical History  Diagnosis Date  . HYPERTENSION   . HYPERLIPIDEMIA   . CAD   . HIATAL HERNIA     Past Surgical History  Procedure Laterality Date  . Cardiac catheterization      History   Social History  . Marital Status: Married    Spouse Name: N/A    Number of Children: N/A  . Years of Education: N/A   Occupational History  . Not on file.   Social History Main Topics  . Smoking status: Never Smoker   . Smokeless tobacco: Not on file  . Alcohol Use: Not on file  . Drug Use: Not on file  . Sexual Activity: Not on  file   Other Topics Concern  . Not on file   Social History Narrative    ROS: Arthralgias but no fevers or chills, productive cough, hemoptysis, dysphasia, odynophagia, melena, hematochezia, dysuria, hematuria, rash, seizure activity, orthopnea, PND, pedal edema, claudication. Remaining systems are negative.  Physical Exam: Well-developed well-nourished in no acute distress.  Skin is warm and dry.  HEENT is normal.  Neck is supple.  Chest is clear to auscultation with normal expansion.  Cardiovascular exam is regular rate and rhythm.  Abdominal exam nontender or distended. No masses palpated. Extremities show no edema. neuro grossly intact  ECG Sinus rhythm, no ST changes

## 2014-04-25 ENCOUNTER — Encounter: Payer: Self-pay | Admitting: *Deleted

## 2014-04-25 ENCOUNTER — Ambulatory Visit (INDEPENDENT_AMBULATORY_CARE_PROVIDER_SITE_OTHER): Payer: Medicare HMO | Admitting: Cardiology

## 2014-04-25 ENCOUNTER — Encounter: Payer: Self-pay | Admitting: Cardiology

## 2014-04-25 VITALS — BP 134/70 | HR 53 | Ht 72.0 in | Wt 176.2 lb

## 2014-04-25 DIAGNOSIS — I2510 Atherosclerotic heart disease of native coronary artery without angina pectoris: Secondary | ICD-10-CM

## 2014-04-25 DIAGNOSIS — I10 Essential (primary) hypertension: Secondary | ICD-10-CM

## 2014-04-25 NOTE — Telephone Encounter (Signed)
This encounter was created in error - please disregard.

## 2014-04-25 NOTE — Assessment & Plan Note (Signed)
Continue aspirin and statin. Schedule stress echocardiogram for risk stratification. Note he had ST changes on exercise treadmill previously.

## 2014-04-25 NOTE — Patient Instructions (Addendum)
Your physician wants you to follow-up in: ONE YEAR WITH DR CRENSHAW You will receive a reminder letter in the mail two months in advance. If you don't receive a letter, please call our office to schedule the follow-up appointment.   Your physician has requested that you have a stress echocardiogram. For further information please visit [email or web address]. Please follow instruction sheet as given.    Exercise Stress Echocardiogram An exercise stress echocardiogram is a heart (cardiac) test used to check the function of your heart. This test may also be called an exercise stress echocardiography or stress echo. This stress test will check how well your heart muscle and valves are working and determine if your heart muscle is getting enough blood. You will exercise on a treadmill to naturally increase or stress the functioning of your heart.  An echocardiogram uses sound waves (ultrasound) to produce an image of your heart. If your heart does not work normally, it may indicate coronary artery disease with poor coronary blood supply. The coronary arteries are the arteries that bring blood and oxygen to your heart. LET YOUR HEALTH CARE PROVIDER KNOW ABOUT:  Any allergies you have.  All medicines you are taking, including vitamins, herbs, eye drops, creams, and over-the-counter medicines.  Previous problems you or members of your family have had with the use of anesthetics.  Any blood disorders you have.  Previous surgeries you have had.  Medical conditions you have.  Possibility of pregnancy, if this applies. RISKS AND COMPLICATIONS Generally, this is a safe procedure. However, as with any procedure, complications can occur. Possible complications can include:  You develop pain or pressure in the following areas:  Chest.  Jaw or neck.  Between your shoulder blades.  Radiating down your left arm.  Dizziness or lightheadedness.  Shortness of breath.  Increased or irregular  heartbeat.  Nausea or vomiting.  Heart attack (rare). BEFORE THE PROCEDURE  Avoid all forms of caffeine for 24 hours before your test or as directed by your health care provider. This includes coffee, tea (even decaffeinated tea), caffeinated sodas, chocolate, cocoa, and certain pain medicines.  Follow your health care provider's instructions regarding eating and drinking before the test.  Take your medicines as directed at regular times with water unless instructed otherwise. Exceptions may include:  If you have diabetes, ask how you are to take your insulin or pills. It is common to adjust insulin dosing the morning of the test.  If you are taking beta-blocker medicines, it is important to talk to your health care provider about these medicines well before the date of your test. Taking beta-blocker medicines may interfere with the test. In some cases, these medicines need to be changed or stopped 24 hours or more before the test.  If you wear a nitroglycerin patch, it may need to be removed prior to the test. Ask your health care provider if the patch should be removed before the test.  If you use an inhaler for any breathing condition, bring it with you to the test.  If you are an outpatient, bring a snack so you can eat right after the stress phase of the test.  Do not smoke for 4 hours prior to the test or as directed by your health care provider.  Wear loose-fitting clothes and comfortable shoes for the test. This test involves walking on a treadmill. PROCEDURE   Multiple electrodes will be put on your chest. If needed, small areas of your chest may be shaved to get   better contact with the electrodes. Once the electrodes are attached to your body, multiple wires will be attached to the electrodes, and your heart rate will be monitored.  You will have an echocardiogram done at rest.  To produce this image of your heart, gel is applied to your chest, and a wand-like tool  (transducer) is moved over the chest. The transducer sends the sound waves through the chest to create the moving images of your heart.  You may need an IV to receive a medication that improves the quality of the pictures.  You will then walk on a treadmill. The treadmill will be started at a slow pace. The treadmill speed and incline will gradually be increased to raise your heart rate.  At the peak of exercise, the treadmill will be stopped. You will lie down immediately on a bed so that a second echocardiogram can be done to visualize your heart's motion with exercise.  The test usually takes 30-60 minutes to complete. AFTER THE PROCEDURE  Your heart rate and blood pressure will be monitored after the test.  You may return to your normal schedule, including diet, activities, and medicines, unless your health care provider tells you otherwise. Document Released: 04/29/2004 Document Revised: 04/30/2013 Document Reviewed: 12/31/2012 Exit[employer] Patient Information 2015 [employer]. This information is not intended to replace advice given to you by your health care provider. Make sure you discuss any questions you have with your health care provider.  

## 2014-04-25 NOTE — Assessment & Plan Note (Signed)
Continue statin. Lipids and liver monitored by primary care. 

## 2014-04-25 NOTE — Assessment & Plan Note (Signed)
BP controlled; continue present meds; K and renal function monitored by primary care.

## 2014-04-28 ENCOUNTER — Encounter: Payer: Self-pay | Admitting: *Deleted

## 2014-04-28 DIAGNOSIS — I2510 Atherosclerotic heart disease of native coronary artery without angina pectoris: Secondary | ICD-10-CM

## 2014-04-28 NOTE — Telephone Encounter (Signed)
This encounter was created in error - please disregard.

## 2014-05-01 ENCOUNTER — Other Ambulatory Visit: Payer: Self-pay | Admitting: Cardiology

## 2014-05-01 NOTE — Telephone Encounter (Signed)
E sent prescription. Pt seen on 04/25/14

## 2014-05-05 ENCOUNTER — Other Ambulatory Visit (HOSPITAL_COMMUNITY): Payer: Medicare HMO

## 2014-05-12 ENCOUNTER — Telehealth (HOSPITAL_COMMUNITY): Payer: Self-pay | Admitting: *Deleted

## 2014-05-13 ENCOUNTER — Telehealth: Payer: Self-pay | Admitting: Cardiology

## 2014-05-13 NOTE — Telephone Encounter (Signed)
05-13-14  Telephone conference call w/pt and Douds BlasKaushma G, [place], [phone].  Per Verl BangsKaushma, no precert required for CPT [phone]

## 2014-05-14 ENCOUNTER — Other Ambulatory Visit: Payer: Self-pay | Admitting: Cardiology

## 2014-05-14 ENCOUNTER — Ambulatory Visit (HOSPITAL_COMMUNITY): Payer: Medicare Other | Attending: Cardiovascular Disease | Admitting: Radiology

## 2014-05-14 DIAGNOSIS — I2510 Atherosclerotic heart disease of native coronary artery without angina pectoris: Secondary | ICD-10-CM | POA: Insufficient documentation

## 2014-05-14 DIAGNOSIS — I10 Essential (primary) hypertension: Secondary | ICD-10-CM | POA: Diagnosis not present

## 2014-05-14 NOTE — Progress Notes (Signed)
Stress Echocardiogram performed.  

## 2014-05-21 ENCOUNTER — Encounter: Payer: Self-pay | Admitting: Cardiology

## 2014-05-21 ENCOUNTER — Ambulatory Visit (INDEPENDENT_AMBULATORY_CARE_PROVIDER_SITE_OTHER): Payer: No Typology Code available for payment source | Admitting: Cardiology

## 2014-05-21 VITALS — BP 143/64 | HR 71 | Ht 72.0 in | Wt 177.0 lb

## 2014-05-21 DIAGNOSIS — I10 Essential (primary) hypertension: Secondary | ICD-10-CM

## 2014-05-21 DIAGNOSIS — I2510 Atherosclerotic heart disease of native coronary artery without angina pectoris: Secondary | ICD-10-CM

## 2014-05-21 DIAGNOSIS — R9439 Abnormal result of other cardiovascular function study: Secondary | ICD-10-CM

## 2014-05-21 NOTE — Patient Instructions (Signed)
Your physician wants you to follow-up in: 6 MONTHS WITH DR CRENSHAW You will receive a reminder letter in the mail two months in advance. If you don't receive a letter, please call our office to schedule the follow-up appointment.  

## 2014-05-21 NOTE — Assessment & Plan Note (Signed)
I reviewed the patient's stress echocardiogram with him today. It appears he may have an LAD lesion. However he is essentially asymptomatic. I discussed the risks and benefits of cardiac catheterization as well as the risks of undiagnosed coronary disease. He would prefer to avoid catheterization and treat with medicines unless he develops symptoms. We will therefore continue with medical therapy. I have asked him to call us with any increased dyspnea on exertion or chest pain. I will see him back in 6 months to make sure that his symptoms are stable.

## 2014-05-21 NOTE — Assessment & Plan Note (Signed)
Continue statin. 

## 2014-05-21 NOTE — Assessment & Plan Note (Signed)
Continue present blood pressure medications. 

## 2014-05-21 NOTE — Progress Notes (Signed)
      HPI: FU coronary artery disease. He has had a previous PCI of his LAD in 2000. His most recent catheterization performed in August 2005 showed nonobstructive disease. Stress echocardiogram January 2016 showed electrical cart a graphic changes, chest pain and ischemia in the LAD distribution. Since I last saw him, the patient denies any dyspnea on exertion, orthopnea, PND, pedal edema, palpitations, syncope or chest pain.  Current Outpatient Prescriptions  Medication Sig Dispense Refill  . aspirin 325 MG tablet Take 325 mg by mouth daily.      Marland Kitchen. atorvastatin (LIPITOR) 80 MG tablet TAKE ONE TABLET BY MOUTH ONCE DAILY 90 tablet 1  . betamethasone dipropionate (DIPROLENE) 0.05 % cream Apply 1 application topically as needed.  3  . Cholecalciferol (CVS VIT D 5000 HIGH-POTENCY) 5000 UNITS capsule Take 5,000 Units by mouth daily.      Marland Kitchen. CIALIS 5 MG tablet Take 5 mg by mouth daily.  3  . fluticasone (FLONASE) 50 MCG/ACT nasal spray     . metoprolol succinate (TOPROL-XL) 25 MG 24 hr tablet TAKE ONE TABLET BY MOUTH ONCE DAILY 90 tablet 3  . ramipril (ALTACE) 10 MG capsule TAKE ONE CAPSULE BY MOUTH ONCE DAILY 90 capsule 1  . Tamsulosin HCl (FLOMAX) 0.4 MG CAPS Take 0.8 mg by mouth daily. PRN     No current facility-administered medications for this visit.     Past Medical History  Diagnosis Date  . HYPERTENSION   . HYPERLIPIDEMIA   . CAD   . HIATAL HERNIA     Past Surgical History  Procedure Laterality Date  . Cardiac catheterization      History   Social History  . Marital Status: Married    Spouse Name: N/A    Number of Children: N/A  . Years of Education: N/A   Occupational History  . Not on file.   Social History Main Topics  . Smoking status: Never Smoker   . Smokeless tobacco: Not on file  . Alcohol Use: Not on file  . Drug Use: Not on file  . Sexual Activity: Not on file   Other Topics Concern  . Not on file   Social History Narrative    ROS: no fevers or  chills, productive cough, hemoptysis, dysphasia, odynophagia, melena, hematochezia, dysuria, hematuria, rash, seizure activity, orthopnea, PND, pedal edema, claudication. Remaining systems are negative.  Physical Exam: Well-developed well-nourished in no acute distress.  Skin is warm and dry.  HEENT is normal.  Neck is supple.  Chest is clear to auscultation with normal expansion.  Cardiovascular exam is regular rate and rhythm.  Abdominal exam nontender or distended. No masses palpated. Extremities show no edema. neuro grossly intact

## 2014-05-21 NOTE — Assessment & Plan Note (Signed)
Continue aspirin and statin. 

## 2014-09-15 ENCOUNTER — Other Ambulatory Visit: Payer: Self-pay | Admitting: Cardiology

## 2014-10-21 ENCOUNTER — Other Ambulatory Visit: Payer: Self-pay | Admitting: Cardiology

## 2014-12-02 NOTE — Progress Notes (Signed)
      HPI: FU coronary artery disease. He has had a previous PCI of his LAD in 2000. His most recent catheterization performed in August 2005 showed nonobstructive disease. Stress echocardiogram January 2016 showed electrographic changes, chest pain and ischemia in the LAD distribution. I discussed options with him at that time and he was felt to be asymptomatic. He elected medical therapy. Since I last saw him, the patient denies any dyspnea on exertion, orthopnea, PND, pedal edema, palpitations, syncope or chest pain.  Current Outpatient Prescriptions  Medication Sig Dispense Refill  . aspirin 325 MG tablet Take 325 mg by mouth daily.      Marland Kitchen atorvastatin (LIPITOR) 80 MG tablet TAKE ONE TABLET BY MOUTH ONCE DAILY 90 tablet 1  . betamethasone dipropionate (DIPROLENE) 0.05 % cream Apply 1 application topically as needed.  3  . calcium carbonate (OS-CAL) 600 MG TABS tablet Take 600 mg by mouth 2 (two) times daily with a meal.    . Cholecalciferol (CVS VIT D 5000 HIGH-POTENCY) 5000 UNITS capsule Take 2,000 Units by mouth daily.     Marland Kitchen CIALIS 5 MG tablet Take 5 mg by mouth daily.  3  . fluticasone (FLONASE) 50 MCG/ACT nasal spray     . metoprolol succinate (TOPROL-XL) 25 MG 24 hr tablet TAKE ONE TABLET BY MOUTH ONCE DAILY 90 tablet 3  . ramipril (ALTACE) 10 MG capsule TAKE ONE CAPSULE BY MOUTH ONCE DAILY 90 capsule 0  . Tamsulosin HCl (FLOMAX) 0.4 MG CAPS Take 0.8 mg by mouth daily. PRN     No current facility-administered medications for this visit.     Past Medical History  Diagnosis Date  . HYPERTENSION   . HYPERLIPIDEMIA   . CAD   . HIATAL HERNIA     Past Surgical History  Procedure Laterality Date  . Cardiac catheterization    . Nose surgery      History   Social History  . Marital Status: Married    Spouse Name: N/A  . Number of Children: N/A  . Years of Education: N/A   Occupational History  . Not on file.   Social History Main Topics  . Smoking status: Never Smoker    . Smokeless tobacco: Not on file  . Alcohol Use: Not on file  . Drug Use: Not on file  . Sexual Activity: Not on file   Other Topics Concern  . Not on file   Social History Narrative    ROS: no fevers or chills, productive cough, hemoptysis, dysphasia, odynophagia, melena, hematochezia, dysuria, hematuria, rash, seizure activity, orthopnea, PND, pedal edema, claudication. Remaining systems are negative.  Physical Exam: Well-developed well-nourished in no acute distress.  Skin is warm and dry.  HEENT is normal.  Neck is supple.  Chest is clear to auscultation with normal expansion.  Cardiovascular exam is regular rate and rhythm.  Abdominal exam nontender or distended. No masses palpated. Extremities show no edema. neuro grossly intact  ECG Marked sinus bradycardia; no ST changes.

## 2014-12-03 ENCOUNTER — Encounter: Payer: Self-pay | Admitting: Cardiology

## 2014-12-03 ENCOUNTER — Ambulatory Visit (INDEPENDENT_AMBULATORY_CARE_PROVIDER_SITE_OTHER): Payer: No Typology Code available for payment source | Admitting: Cardiology

## 2014-12-03 VITALS — BP 143/67 | HR 48 | Ht 72.0 in | Wt 170.0 lb

## 2014-12-03 DIAGNOSIS — I2510 Atherosclerotic heart disease of native coronary artery without angina pectoris: Secondary | ICD-10-CM

## 2014-12-03 DIAGNOSIS — I10 Essential (primary) hypertension: Secondary | ICD-10-CM | POA: Diagnosis not present

## 2014-12-03 DIAGNOSIS — R9439 Abnormal result of other cardiovascular function study: Secondary | ICD-10-CM

## 2014-12-03 NOTE — Assessment & Plan Note (Signed)
Continue aspirin and statin. 

## 2014-12-03 NOTE — Assessment & Plan Note (Signed)
Blood pressure borderline. I have asked him to follow this at home and we will add additional medications as needed.

## 2014-12-03 NOTE — Assessment & Plan Note (Signed)
Continue statin. 

## 2014-12-03 NOTE — Assessment & Plan Note (Signed)
I reviewed the patient's stress echocardiogram with him again today. It appears he may have an LAD lesion. However he is essentially asymptomatic. I discussed the risks and benefits of cardiac catheterization as well as the risks of undiagnosed coronary disease. He would prefer to avoid catheterization and treat with medicines unless he develops symptoms. We will therefore continue with medical therapy. I have asked him to call us with any increased dyspnea on exertion or chest pain.

## 2014-12-03 NOTE — Patient Instructions (Signed)
Your physician wants you to follow-up in: ONE YEAR WITH DR CRENSHAW You will receive a reminder letter in the mail two months in advance. If you don't receive a letter, please call our office to schedule the follow-up appointment.  

## 2014-12-31 HISTORY — PX: COLONOSCOPY: SHX174

## 2015-01-22 ENCOUNTER — Other Ambulatory Visit: Payer: Self-pay | Admitting: Cardiology

## 2015-01-22 ENCOUNTER — Other Ambulatory Visit: Payer: Self-pay | Admitting: *Deleted

## 2015-01-22 MED ORDER — METOPROLOL SUCCINATE ER 25 MG PO TB24: 25.0000 mg | ORAL_TABLET | Freq: Every day | ORAL | Status: DC

## 2015-01-22 NOTE — Telephone Encounter (Signed)
Metoprolol refilled electroncally.

## 2015-02-13 ENCOUNTER — Other Ambulatory Visit: Payer: Self-pay | Admitting: Cardiology

## 2015-02-13 NOTE — Telephone Encounter (Signed)
Ok to refill. Thanks for all you do.

## 2015-02-13 NOTE — Telephone Encounter (Signed)
Most recent lipid panel in epic is from 2013. Ok to refill? Please advise. Thanks, MI

## 2015-05-12 ENCOUNTER — Other Ambulatory Visit: Payer: Self-pay | Admitting: *Deleted

## 2015-05-12 ENCOUNTER — Telehealth: Payer: Self-pay | Admitting: Cardiology

## 2015-05-12 DIAGNOSIS — H2512 Age-related nuclear cataract, left eye: Secondary | ICD-10-CM | POA: Diagnosis not present

## 2015-05-12 MED ORDER — ATORVASTATIN CALCIUM 80 MG PO TABS: 80.0000 mg | ORAL_TABLET | Freq: Every day | ORAL | Status: DC

## 2015-05-12 NOTE — Telephone Encounter (Signed)
Refill sent to the pharmacy electronically.  

## 2015-05-12 NOTE — Telephone Encounter (Signed)
°*  STAT* If patient is at the pharmacy, call can be transferred to refill team.   1. Which medications need to be refilled? (please list name of each medication and dose if known) Atorvastatin  2. Which pharmacy/location (including street and city if local pharmacy) is medication to be sent to?[facility]-[phone]  3. Do they need a 30 day or 90 day supply? 90 and refills

## 2015-05-25 DIAGNOSIS — L84 Corns and callosities: Secondary | ICD-10-CM | POA: Diagnosis not present

## 2015-05-25 DIAGNOSIS — M2042 Other hammer toe(s) (acquired), left foot: Secondary | ICD-10-CM | POA: Diagnosis not present

## 2015-05-25 DIAGNOSIS — M2041 Other hammer toe(s) (acquired), right foot: Secondary | ICD-10-CM | POA: Diagnosis not present

## 2015-05-25 DIAGNOSIS — M2011 Hallux valgus (acquired), right foot: Secondary | ICD-10-CM | POA: Diagnosis not present

## 2015-05-25 DIAGNOSIS — M2012 Hallux valgus (acquired), left foot: Secondary | ICD-10-CM | POA: Diagnosis not present

## 2015-06-09 DIAGNOSIS — M1990 Unspecified osteoarthritis, unspecified site: Secondary | ICD-10-CM | POA: Diagnosis not present

## 2015-06-09 DIAGNOSIS — Z79899 Other long term (current) drug therapy: Secondary | ICD-10-CM | POA: Diagnosis not present

## 2015-06-09 DIAGNOSIS — H2512 Age-related nuclear cataract, left eye: Secondary | ICD-10-CM | POA: Diagnosis not present

## 2015-06-09 DIAGNOSIS — E785 Hyperlipidemia, unspecified: Secondary | ICD-10-CM | POA: Diagnosis not present

## 2015-06-09 DIAGNOSIS — Z955 Presence of coronary angioplasty implant and graft: Secondary | ICD-10-CM | POA: Diagnosis not present

## 2015-06-09 DIAGNOSIS — I10 Essential (primary) hypertension: Secondary | ICD-10-CM | POA: Diagnosis not present

## 2015-06-09 DIAGNOSIS — N400 Enlarged prostate without lower urinary tract symptoms: Secondary | ICD-10-CM | POA: Diagnosis not present

## 2015-06-09 DIAGNOSIS — I2510 Atherosclerotic heart disease of native coronary artery without angina pectoris: Secondary | ICD-10-CM | POA: Diagnosis not present

## 2015-07-06 DIAGNOSIS — H524 Presbyopia: Secondary | ICD-10-CM | POA: Diagnosis not present

## 2015-08-04 DIAGNOSIS — R69 Illness, unspecified: Secondary | ICD-10-CM | POA: Diagnosis not present

## 2015-08-11 DIAGNOSIS — H04123 Dry eye syndrome of bilateral lacrimal glands: Secondary | ICD-10-CM | POA: Diagnosis not present

## 2015-08-18 DIAGNOSIS — Z955 Presence of coronary angioplasty implant and graft: Secondary | ICD-10-CM | POA: Diagnosis not present

## 2015-08-18 DIAGNOSIS — I10 Essential (primary) hypertension: Secondary | ICD-10-CM | POA: Diagnosis not present

## 2015-08-18 DIAGNOSIS — H2511 Age-related nuclear cataract, right eye: Secondary | ICD-10-CM | POA: Diagnosis not present

## 2015-08-18 DIAGNOSIS — E785 Hyperlipidemia, unspecified: Secondary | ICD-10-CM | POA: Diagnosis not present

## 2015-08-18 DIAGNOSIS — Z79899 Other long term (current) drug therapy: Secondary | ICD-10-CM | POA: Diagnosis not present

## 2015-08-18 DIAGNOSIS — I2510 Atherosclerotic heart disease of native coronary artery without angina pectoris: Secondary | ICD-10-CM | POA: Diagnosis not present

## 2015-08-18 DIAGNOSIS — N400 Enlarged prostate without lower urinary tract symptoms: Secondary | ICD-10-CM | POA: Diagnosis not present

## 2015-08-19 DIAGNOSIS — R2689 Other abnormalities of gait and mobility: Secondary | ICD-10-CM | POA: Diagnosis not present

## 2015-08-19 DIAGNOSIS — M25571 Pain in right ankle and joints of right foot: Secondary | ICD-10-CM | POA: Diagnosis not present

## 2015-08-19 DIAGNOSIS — M25671 Stiffness of right ankle, not elsewhere classified: Secondary | ICD-10-CM | POA: Diagnosis not present

## 2015-08-25 DIAGNOSIS — N400 Enlarged prostate without lower urinary tract symptoms: Secondary | ICD-10-CM | POA: Diagnosis not present

## 2015-08-25 DIAGNOSIS — R69 Illness, unspecified: Secondary | ICD-10-CM | POA: Diagnosis not present

## 2015-08-25 DIAGNOSIS — E785 Hyperlipidemia, unspecified: Secondary | ICD-10-CM | POA: Diagnosis not present

## 2015-08-25 DIAGNOSIS — I2510 Atherosclerotic heart disease of native coronary artery without angina pectoris: Secondary | ICD-10-CM | POA: Diagnosis not present

## 2015-08-26 DIAGNOSIS — R69 Illness, unspecified: Secondary | ICD-10-CM | POA: Diagnosis not present

## 2015-08-28 DIAGNOSIS — M25671 Stiffness of right ankle, not elsewhere classified: Secondary | ICD-10-CM | POA: Diagnosis not present

## 2015-08-28 DIAGNOSIS — R2689 Other abnormalities of gait and mobility: Secondary | ICD-10-CM | POA: Diagnosis not present

## 2015-08-28 DIAGNOSIS — M25571 Pain in right ankle and joints of right foot: Secondary | ICD-10-CM | POA: Diagnosis not present

## 2015-09-04 DIAGNOSIS — M25671 Stiffness of right ankle, not elsewhere classified: Secondary | ICD-10-CM | POA: Diagnosis not present

## 2015-09-04 DIAGNOSIS — M25571 Pain in right ankle and joints of right foot: Secondary | ICD-10-CM | POA: Diagnosis not present

## 2015-09-04 DIAGNOSIS — R2689 Other abnormalities of gait and mobility: Secondary | ICD-10-CM | POA: Diagnosis not present

## 2015-09-18 DIAGNOSIS — R2689 Other abnormalities of gait and mobility: Secondary | ICD-10-CM | POA: Diagnosis not present

## 2015-09-18 DIAGNOSIS — H524 Presbyopia: Secondary | ICD-10-CM | POA: Diagnosis not present

## 2015-09-18 DIAGNOSIS — M25571 Pain in right ankle and joints of right foot: Secondary | ICD-10-CM | POA: Diagnosis not present

## 2015-09-18 DIAGNOSIS — M25671 Stiffness of right ankle, not elsewhere classified: Secondary | ICD-10-CM | POA: Diagnosis not present

## 2015-09-23 DIAGNOSIS — R69 Illness, unspecified: Secondary | ICD-10-CM | POA: Diagnosis not present

## 2015-09-25 DIAGNOSIS — M25671 Stiffness of right ankle, not elsewhere classified: Secondary | ICD-10-CM | POA: Diagnosis not present

## 2015-09-25 DIAGNOSIS — R2689 Other abnormalities of gait and mobility: Secondary | ICD-10-CM | POA: Diagnosis not present

## 2015-09-25 DIAGNOSIS — N5202 Corporo-venous occlusive erectile dysfunction: Secondary | ICD-10-CM | POA: Diagnosis not present

## 2015-09-25 DIAGNOSIS — N3020 Other chronic cystitis without hematuria: Secondary | ICD-10-CM | POA: Diagnosis not present

## 2015-09-25 DIAGNOSIS — N318 Other neuromuscular dysfunction of bladder: Secondary | ICD-10-CM | POA: Diagnosis not present

## 2015-09-25 DIAGNOSIS — M25571 Pain in right ankle and joints of right foot: Secondary | ICD-10-CM | POA: Diagnosis not present

## 2015-09-25 DIAGNOSIS — N401 Enlarged prostate with lower urinary tract symptoms: Secondary | ICD-10-CM | POA: Diagnosis not present

## 2015-10-01 DIAGNOSIS — R2689 Other abnormalities of gait and mobility: Secondary | ICD-10-CM | POA: Diagnosis not present

## 2015-10-01 DIAGNOSIS — M25671 Stiffness of right ankle, not elsewhere classified: Secondary | ICD-10-CM | POA: Diagnosis not present

## 2015-10-01 DIAGNOSIS — M25571 Pain in right ankle and joints of right foot: Secondary | ICD-10-CM | POA: Diagnosis not present

## 2015-10-09 ENCOUNTER — Other Ambulatory Visit: Payer: Self-pay | Admitting: Cardiology

## 2015-10-09 NOTE — Telephone Encounter (Signed)
Rx(s) sent to pharmacy electronically.  

## 2015-10-14 DIAGNOSIS — M25571 Pain in right ankle and joints of right foot: Secondary | ICD-10-CM | POA: Diagnosis not present

## 2015-10-14 DIAGNOSIS — R2689 Other abnormalities of gait and mobility: Secondary | ICD-10-CM | POA: Diagnosis not present

## 2015-10-14 DIAGNOSIS — M25671 Stiffness of right ankle, not elsewhere classified: Secondary | ICD-10-CM | POA: Diagnosis not present

## 2015-11-04 NOTE — Progress Notes (Signed)
HPI: FU coronary artery disease. He has had a previous PCI of his LAD in 2000. His most recent catheterization performed in August 2005 showed nonobstructive disease. Stress echocardiogram January 2016 showed electrographic changes, chest pain and ischemia in the LAD distribution. I discussed options with him at that time and he was felt to be asymptomatic. He elected medical therapy. Since I last saw him, the patient denies any dyspnea on exertion, orthopnea, PND, pedal edema, palpitations, syncope or chest pain.   Current Outpatient Prescriptions  Medication Sig Dispense Refill  . aspirin 325 MG tablet Take 325 mg by mouth daily.      Marland Kitchen. atorvastatin (LIPITOR) 80 MG tablet Take 1 tablet (80 mg total) by mouth daily. 90 tablet 3  . betamethasone dipropionate (DIPROLENE) 0.05 % cream Apply 1 application topically as needed.  3  . calcium carbonate (OS-CAL) 600 MG TABS tablet Take 600 mg by mouth 2 (two) times daily with a meal.    . Cholecalciferol ([facility] VIT D 5000 HIGH-POTENCY) 5000 UNITS capsule Take 2,000 Units by mouth daily.     . fluticasone (FLONASE) 50 MCG/ACT nasal spray     . metoprolol succinate (TOPROL-XL) 25 MG 24 hr tablet TAKE 1 TABLET BY MOUTH EVERY DAY 90 tablet 3  . ramipril (ALTACE) 10 MG capsule TAKE ONE CAPSULE BY MOUTH ONCE DAILY 90 capsule 2  . Tamsulosin HCl (FLOMAX) 0.4 MG CAPS Take 0.8 mg by mouth daily. PRN     No current facility-administered medications for this visit.     Past Medical History  Diagnosis Date  . HYPERTENSION   . HYPERLIPIDEMIA   . CAD   . HIATAL HERNIA     Past Surgical History  Procedure Laterality Date  . Cardiac catheterization    . Nose surgery      Social History   Social History  . Marital Status: Married    Spouse Name: N/A  . Number of Children: N/A  . Years of Education: N/A   Occupational History  . Not on file.   Social History Main Topics  . Smoking status: Never Smoker   . Smokeless tobacco: Not on file    . Alcohol Use: Not on file  . Drug Use: Not on file  . Sexual Activity: Not on file   Other Topics Concern  . Not on file   Social History Narrative    Family History  Problem Relation Age of Onset  . Heart attack Father   . Hypertension Father   . Hypertension Sister   . Diabetes Sister   . Alzheimer's disease Mother     SOME TYPE OF VALUE DISORDER  . Obesity Sister   . Obesity Father   . Other Son     NO HEALTH PROBLEMS  . Other Daughter     NO HEALTH PROBLEMS    ROS: no fevers or chills, productive cough, hemoptysis, dysphasia, odynophagia, melena, hematochezia, dysuria, hematuria, rash, seizure activity, orthopnea, PND, pedal edema, claudication. Remaining systems are negative.  Physical Exam: Well-developed well-nourished in no acute distress.  Skin is warm and dry.  HEENT is normal.  Neck is supple.  Chest is clear to auscultation with normal expansion.  Cardiovascular exam is regular rate and rhythm.  Abdominal exam nontender or distended. No masses palpated. Extremities show no edema. neuro grossly intact  ECG -Sinus bradycardia at a rate of 59. No ST changes.    Assessment and plan  1 hyperlipidemia-continue statin.  2 hypertension-blood  pressure elevated. Add HCTZ 12.5 mg daily. Check potassium and renal function in one week. Follow blood pressure and adjust regimen as needed. 3 coronary artery disease-continue aspirin and statin. 4 previous abnormal stress echocardiogram-patient continues to do well with no significant dyspnea on exertion or chest pain. We will plan medical therapy and he will contact us with any worsening symptoms.  Olga MillersBrian Giara Mcgaughey, MD

## 2015-11-11 ENCOUNTER — Ambulatory Visit (INDEPENDENT_AMBULATORY_CARE_PROVIDER_SITE_OTHER): Payer: Medicare HMO | Admitting: Cardiology

## 2015-11-11 ENCOUNTER — Encounter: Payer: Self-pay | Admitting: Cardiology

## 2015-11-11 VITALS — BP 159/79 | HR 60 | Ht 72.0 in | Wt 173.8 lb

## 2015-11-11 DIAGNOSIS — I10 Essential (primary) hypertension: Secondary | ICD-10-CM | POA: Diagnosis not present

## 2015-11-11 DIAGNOSIS — I2510 Atherosclerotic heart disease of native coronary artery without angina pectoris: Secondary | ICD-10-CM | POA: Diagnosis not present

## 2015-11-11 DIAGNOSIS — R9439 Abnormal result of other cardiovascular function study: Secondary | ICD-10-CM | POA: Diagnosis not present

## 2015-11-11 DIAGNOSIS — E785 Hyperlipidemia, unspecified: Secondary | ICD-10-CM

## 2015-11-11 MED ORDER — ATORVASTATIN CALCIUM 80 MG PO TABS: 80.0000 mg | ORAL_TABLET | Freq: Every day | ORAL | Status: DC

## 2015-11-11 MED ORDER — RAMIPRIL 10 MG PO CAPS: 10.0000 mg | ORAL_CAPSULE | Freq: Every day | ORAL | Status: DC

## 2015-11-11 MED ORDER — HYDROCHLOROTHIAZIDE 12.5 MG PO CAPS: 12.5000 mg | ORAL_CAPSULE | Freq: Every day | ORAL | Status: DC

## 2015-11-11 MED ORDER — METOPROLOL SUCCINATE ER 25 MG PO TB24: 25.0000 mg | ORAL_TABLET | Freq: Every day | ORAL | Status: DC

## 2015-11-11 NOTE — Patient Instructions (Signed)
Medication Instructions:   START HCTZ 12.5 MG ONCE DAILY  Labwork:  Your physician recommends that you return for lab work in: ONE WEEK WITH DR Maisie FusHOMAS  Follow-Up:  Your physician wants you to follow-up in: ONE YEAR WITH DR Shelda PalRENSHAW You will receive a reminder letter in the mail two months in advance. If you don't receive a letter, please call our office to schedule the follow-up appointment.   If you need a refill on your cardiac medications before your next appointment, please call your pharmacy.

## 2015-11-19 DIAGNOSIS — I10 Essential (primary) hypertension: Secondary | ICD-10-CM | POA: Diagnosis not present

## 2015-11-25 ENCOUNTER — Telehealth: Payer: Self-pay | Admitting: *Deleted

## 2015-11-25 NOTE — Telephone Encounter (Signed)
Labs from [facility] reviewed by dr Jens Somcrenshaw. No change in medications at this time. Pt advised to watch potassium rich foods.

## 2016-01-25 DIAGNOSIS — N39498 Other specified urinary incontinence: Secondary | ICD-10-CM | POA: Diagnosis not present

## 2016-01-25 DIAGNOSIS — N401 Enlarged prostate with lower urinary tract symptoms: Secondary | ICD-10-CM | POA: Diagnosis not present

## 2016-02-03 DIAGNOSIS — R69 Illness, unspecified: Secondary | ICD-10-CM | POA: Diagnosis not present

## 2016-02-18 ENCOUNTER — Encounter: Payer: Self-pay | Admitting: Cardiovascular Disease

## 2016-02-18 ENCOUNTER — Encounter: Payer: Self-pay | Admitting: Cardiology

## 2016-02-18 DIAGNOSIS — I10 Essential (primary) hypertension: Secondary | ICD-10-CM | POA: Diagnosis not present

## 2016-02-18 DIAGNOSIS — E559 Vitamin D deficiency, unspecified: Secondary | ICD-10-CM | POA: Diagnosis not present

## 2016-02-18 DIAGNOSIS — R202 Paresthesia of skin: Secondary | ICD-10-CM | POA: Diagnosis not present

## 2016-02-18 DIAGNOSIS — Z0000 Encounter for general adult medical examination without abnormal findings: Secondary | ICD-10-CM | POA: Diagnosis not present

## 2016-02-18 DIAGNOSIS — Z1211 Encounter for screening for malignant neoplasm of colon: Secondary | ICD-10-CM | POA: Diagnosis not present

## 2016-02-18 DIAGNOSIS — J301 Allergic rhinitis due to pollen: Secondary | ICD-10-CM | POA: Diagnosis not present

## 2016-02-18 DIAGNOSIS — Z1389 Encounter for screening for other disorder: Secondary | ICD-10-CM | POA: Diagnosis not present

## 2016-02-18 DIAGNOSIS — Z23 Encounter for immunization: Secondary | ICD-10-CM | POA: Diagnosis not present

## 2016-02-18 DIAGNOSIS — Z125 Encounter for screening for malignant neoplasm of prostate: Secondary | ICD-10-CM | POA: Diagnosis not present

## 2016-02-18 DIAGNOSIS — E7800 Pure hypercholesterolemia, unspecified: Secondary | ICD-10-CM | POA: Diagnosis not present

## 2016-03-01 DIAGNOSIS — Z9181 History of falling: Secondary | ICD-10-CM | POA: Diagnosis not present

## 2016-03-01 DIAGNOSIS — Z136 Encounter for screening for cardiovascular disorders: Secondary | ICD-10-CM | POA: Diagnosis not present

## 2016-03-01 DIAGNOSIS — Z0000 Encounter for general adult medical examination without abnormal findings: Secondary | ICD-10-CM | POA: Diagnosis not present

## 2016-03-03 DIAGNOSIS — Z1211 Encounter for screening for malignant neoplasm of colon: Secondary | ICD-10-CM | POA: Diagnosis not present

## 2016-03-07 DIAGNOSIS — R202 Paresthesia of skin: Secondary | ICD-10-CM | POA: Diagnosis not present

## 2016-03-07 DIAGNOSIS — R69 Illness, unspecified: Secondary | ICD-10-CM | POA: Diagnosis not present

## 2016-03-07 DIAGNOSIS — E7800 Pure hypercholesterolemia, unspecified: Secondary | ICD-10-CM | POA: Diagnosis not present

## 2016-03-07 DIAGNOSIS — I10 Essential (primary) hypertension: Secondary | ICD-10-CM | POA: Diagnosis not present

## 2016-03-07 DIAGNOSIS — Z87898 Personal history of other specified conditions: Secondary | ICD-10-CM | POA: Diagnosis not present

## 2016-03-07 DIAGNOSIS — L84 Corns and callosities: Secondary | ICD-10-CM | POA: Diagnosis not present

## 2016-03-22 DIAGNOSIS — N318 Other neuromuscular dysfunction of bladder: Secondary | ICD-10-CM | POA: Diagnosis not present

## 2016-03-22 DIAGNOSIS — N3020 Other chronic cystitis without hematuria: Secondary | ICD-10-CM | POA: Diagnosis not present

## 2016-03-22 DIAGNOSIS — N400 Enlarged prostate without lower urinary tract symptoms: Secondary | ICD-10-CM | POA: Diagnosis not present

## 2016-03-25 DIAGNOSIS — H04123 Dry eye syndrome of bilateral lacrimal glands: Secondary | ICD-10-CM | POA: Diagnosis not present

## 2016-03-25 DIAGNOSIS — H26493 Other secondary cataract, bilateral: Secondary | ICD-10-CM | POA: Diagnosis not present

## 2016-04-22 ENCOUNTER — Other Ambulatory Visit: Payer: Self-pay | Admitting: Cardiology

## 2016-05-24 DIAGNOSIS — G609 Hereditary and idiopathic neuropathy, unspecified: Secondary | ICD-10-CM | POA: Diagnosis not present

## 2016-05-24 DIAGNOSIS — Z79899 Other long term (current) drug therapy: Secondary | ICD-10-CM | POA: Diagnosis not present

## 2016-06-09 DIAGNOSIS — M5416 Radiculopathy, lumbar region: Secondary | ICD-10-CM | POA: Diagnosis not present

## 2016-07-11 DIAGNOSIS — G609 Hereditary and idiopathic neuropathy, unspecified: Secondary | ICD-10-CM | POA: Diagnosis not present

## 2016-07-11 DIAGNOSIS — N400 Enlarged prostate without lower urinary tract symptoms: Secondary | ICD-10-CM | POA: Diagnosis not present

## 2016-07-11 DIAGNOSIS — E7800 Pure hypercholesterolemia, unspecified: Secondary | ICD-10-CM | POA: Diagnosis not present

## 2016-07-11 DIAGNOSIS — I10 Essential (primary) hypertension: Secondary | ICD-10-CM | POA: Diagnosis not present

## 2016-07-13 DIAGNOSIS — G629 Polyneuropathy, unspecified: Secondary | ICD-10-CM | POA: Diagnosis not present

## 2016-07-13 DIAGNOSIS — R202 Paresthesia of skin: Secondary | ICD-10-CM | POA: Diagnosis not present

## 2016-07-13 DIAGNOSIS — M5416 Radiculopathy, lumbar region: Secondary | ICD-10-CM | POA: Diagnosis not present

## 2016-07-13 DIAGNOSIS — G2581 Restless legs syndrome: Secondary | ICD-10-CM | POA: Diagnosis not present

## 2016-08-03 DIAGNOSIS — R69 Illness, unspecified: Secondary | ICD-10-CM | POA: Diagnosis not present

## 2016-08-16 DIAGNOSIS — M89371 Hypertrophy of bone, right ankle and foot: Secondary | ICD-10-CM | POA: Diagnosis not present

## 2016-08-16 DIAGNOSIS — M2011 Hallux valgus (acquired), right foot: Secondary | ICD-10-CM | POA: Diagnosis not present

## 2016-08-24 DIAGNOSIS — G5762 Lesion of plantar nerve, left lower limb: Secondary | ICD-10-CM | POA: Diagnosis not present

## 2016-08-24 DIAGNOSIS — R202 Paresthesia of skin: Secondary | ICD-10-CM | POA: Diagnosis not present

## 2016-08-24 DIAGNOSIS — M5416 Radiculopathy, lumbar region: Secondary | ICD-10-CM | POA: Diagnosis not present

## 2016-08-24 DIAGNOSIS — G2581 Restless legs syndrome: Secondary | ICD-10-CM | POA: Diagnosis not present

## 2016-09-19 DIAGNOSIS — E291 Testicular hypofunction: Secondary | ICD-10-CM | POA: Diagnosis not present

## 2016-09-19 DIAGNOSIS — R351 Nocturia: Secondary | ICD-10-CM | POA: Diagnosis not present

## 2016-09-19 DIAGNOSIS — N3020 Other chronic cystitis without hematuria: Secondary | ICD-10-CM | POA: Diagnosis not present

## 2016-09-19 DIAGNOSIS — N401 Enlarged prostate with lower urinary tract symptoms: Secondary | ICD-10-CM | POA: Diagnosis not present

## 2016-09-19 DIAGNOSIS — N318 Other neuromuscular dysfunction of bladder: Secondary | ICD-10-CM | POA: Diagnosis not present

## 2016-09-22 DIAGNOSIS — H6593 Unspecified nonsuppurative otitis media, bilateral: Secondary | ICD-10-CM | POA: Diagnosis not present

## 2016-09-22 DIAGNOSIS — H6122 Impacted cerumen, left ear: Secondary | ICD-10-CM | POA: Diagnosis not present

## 2016-09-22 DIAGNOSIS — H9193 Unspecified hearing loss, bilateral: Secondary | ICD-10-CM | POA: Diagnosis not present

## 2016-09-26 ENCOUNTER — Telehealth: Payer: Self-pay | Admitting: Cardiology

## 2016-09-26 NOTE — Telephone Encounter (Signed)
Error

## 2016-09-29 DIAGNOSIS — H9193 Unspecified hearing loss, bilateral: Secondary | ICD-10-CM | POA: Diagnosis not present

## 2016-09-29 DIAGNOSIS — N400 Enlarged prostate without lower urinary tract symptoms: Secondary | ICD-10-CM | POA: Diagnosis not present

## 2016-11-22 ENCOUNTER — Other Ambulatory Visit: Payer: Self-pay | Admitting: *Deleted

## 2016-11-22 ENCOUNTER — Other Ambulatory Visit: Payer: Self-pay | Admitting: Cardiology

## 2016-11-22 DIAGNOSIS — I10 Essential (primary) hypertension: Secondary | ICD-10-CM

## 2016-11-22 MED ORDER — RAMIPRIL 10 MG PO CAPS: 10.0000 mg | ORAL_CAPSULE | Freq: Every day | ORAL | 0 refills | Status: DC

## 2016-11-22 NOTE — Telephone Encounter (Signed)
Rx has been sent to the pharmacy electronically. ° °

## 2016-11-23 ENCOUNTER — Other Ambulatory Visit: Payer: Self-pay | Admitting: Cardiology

## 2016-11-23 DIAGNOSIS — I10 Essential (primary) hypertension: Secondary | ICD-10-CM

## 2016-12-09 NOTE — Progress Notes (Signed)
HPI: FU coronary artery disease. He has had a previous PCI of his LAD in 2000. His most recent catheterization performed in August 2005 showed nonobstructive disease. Stress echocardiogram January 2016 showed electrographic changes, chest pain and ischemia in the LAD distribution. I discussed options with him at that time and he was felt to be asymptomatic. He elected medical therapy. Since I last saw him, patient denies dyspnea, chest pain, palpitations or syncope. He is having some muscle and joint aches that is attributed to arthritis.   Current Outpatient Prescriptions  Medication Sig Dispense Refill  . aspirin 325 MG tablet Take 325 mg by mouth daily.      Marland Kitchen. atorvastatin (LIPITOR) 40 MG tablet Take 40 mg by mouth daily.    . betamethasone dipropionate (DIPROLENE) 0.05 % cream Apply 1 application topically as needed.  3  . calcium carbonate (OS-CAL) 600 MG TABS tablet Take 600 mg by mouth daily with breakfast.     . Cholecalciferol ([facility]S VIT D 5000 HIGH-POTENCY) 5000 UNITS capsule Take 2,000 Units by mouth daily.     . finasteride (PROSCAR) 5 MG tablet Take 5 mg by mouth daily.    . fluticasone (FLONASE) 50 MCG/ACT nasal spray     . gabapentin (NEURONTIN) 300 MG capsule Take 300 mg by mouth daily.    . hydroxypropyl methylcellulose / hypromellose (ISOPTO TEARS / GONIOVISC) 2.5 % ophthalmic solution Place 2 drops into both eyes at bedtime.    . metoprolol succinate (TOPROL-XL) 25 MG 24 hr tablet TAKE 1 TABLET BY MOUTH EVERY DAY 90 tablet 1  . ramipril (ALTACE) 10 MG capsule Take 1 capsule (10 mg total) by mouth daily. 90 capsule 0  . Tamsulosin HCl (FLOMAX) 0.4 MG CAPS Take 0.8 mg by mouth daily. PRN     No current facility-administered medications for this visit.      Past Medical History:  Diagnosis Date  . CAD   . HIATAL HERNIA   . HYPERLIPIDEMIA   . HYPERTENSION     Past Surgical History:  Procedure Laterality Date  . CARDIAC CATHETERIZATION    . NOSE SURGERY       Social History   Social History  . Marital status: Married    Spouse name: N/A  . Number of children: N/A  . Years of education: N/A   Occupational History  . Not on file.   Social History Main Topics  . Smoking status: Never Smoker  . Smokeless tobacco: Not on file  . Alcohol use Not on file  . Drug use: Unknown  . Sexual activity: Not on file   Other Topics Concern  . Not on file   Social History Narrative  . No narrative on file    Family History  Problem Relation Age of Onset  . Heart attack Father   . Hypertension Father   . Hypertension Sister   . Diabetes Sister   . Alzheimer's disease Mother        SOME TYPE OF VALUE DISORDER  . Obesity Sister   . Obesity Father   . Other Son        NO HEALTH PROBLEMS  . Other Daughter        NO HEALTH PROBLEMS    ROS: Muscle and joint aches but no fevers or chills, productive cough, hemoptysis, dysphasia, odynophagia, melena, hematochezia, dysuria, hematuria, rash, seizure activity, orthopnea, PND, pedal edema, claudication. Remaining systems are negative.  Physical Exam: Well-developed well-nourished in no acute distress.  Skin is warm and dry.  HEENT is normal.  Neck is supple.  Chest is clear to auscultation with normal expansion.  Cardiovascular exam is regular rate and rhythm.  Abdominal exam nontender or distended. No masses palpated. No bruit Extremities show no edema. neuro grossly intact  ECG- Sinus bradycardia at a rate of 51. No ST changes. personally reviewed  A/P  1 Coronary artery disease-continue aspirin and statin. Patient had a previous abnormal stress echocardiogram. We will plan to proceed with a stress nuclear study for risk stratification.  2 hypertension-blood pressure is controlled. Continue present medications. Check potassium and renal function.   3 Hyperlipidemia-patient is complaining of arthralgias predominantly. This may be secondary to arthritis. However I would like to make  sure it is not related to his statin. We will hold Lipitor for 4 weeks. If his symptoms improve we will consider a different statin. If not his Lipitor will be resumed and we will check lipids and liver 6 weeks later.

## 2016-12-13 DIAGNOSIS — M9901 Segmental and somatic dysfunction of cervical region: Secondary | ICD-10-CM | POA: Diagnosis not present

## 2016-12-13 DIAGNOSIS — M9903 Segmental and somatic dysfunction of lumbar region: Secondary | ICD-10-CM | POA: Diagnosis not present

## 2016-12-13 DIAGNOSIS — M62830 Muscle spasm of back: Secondary | ICD-10-CM | POA: Diagnosis not present

## 2016-12-13 DIAGNOSIS — M5383 Other specified dorsopathies, cervicothoracic region: Secondary | ICD-10-CM | POA: Diagnosis not present

## 2016-12-13 DIAGNOSIS — M545 Low back pain: Secondary | ICD-10-CM | POA: Diagnosis not present

## 2016-12-13 DIAGNOSIS — M50322 Other cervical disc degeneration at C5-C6 level: Secondary | ICD-10-CM | POA: Diagnosis not present

## 2016-12-13 DIAGNOSIS — M9905 Segmental and somatic dysfunction of pelvic region: Secondary | ICD-10-CM | POA: Diagnosis not present

## 2016-12-13 DIAGNOSIS — M9902 Segmental and somatic dysfunction of thoracic region: Secondary | ICD-10-CM | POA: Diagnosis not present

## 2016-12-13 DIAGNOSIS — M542 Cervicalgia: Secondary | ICD-10-CM | POA: Diagnosis not present

## 2016-12-14 DIAGNOSIS — M5383 Other specified dorsopathies, cervicothoracic region: Secondary | ICD-10-CM | POA: Diagnosis not present

## 2016-12-14 DIAGNOSIS — M9905 Segmental and somatic dysfunction of pelvic region: Secondary | ICD-10-CM | POA: Diagnosis not present

## 2016-12-14 DIAGNOSIS — M62830 Muscle spasm of back: Secondary | ICD-10-CM | POA: Diagnosis not present

## 2016-12-14 DIAGNOSIS — M9903 Segmental and somatic dysfunction of lumbar region: Secondary | ICD-10-CM | POA: Diagnosis not present

## 2016-12-14 DIAGNOSIS — M9902 Segmental and somatic dysfunction of thoracic region: Secondary | ICD-10-CM | POA: Diagnosis not present

## 2016-12-14 DIAGNOSIS — M9901 Segmental and somatic dysfunction of cervical region: Secondary | ICD-10-CM | POA: Diagnosis not present

## 2016-12-14 DIAGNOSIS — M545 Low back pain: Secondary | ICD-10-CM | POA: Diagnosis not present

## 2016-12-14 DIAGNOSIS — M542 Cervicalgia: Secondary | ICD-10-CM | POA: Diagnosis not present

## 2016-12-14 DIAGNOSIS — M50322 Other cervical disc degeneration at C5-C6 level: Secondary | ICD-10-CM | POA: Diagnosis not present

## 2016-12-19 ENCOUNTER — Encounter: Payer: Self-pay | Admitting: Cardiology

## 2016-12-19 ENCOUNTER — Ambulatory Visit (INDEPENDENT_AMBULATORY_CARE_PROVIDER_SITE_OTHER): Payer: Medicare HMO | Admitting: Cardiology

## 2016-12-19 VITALS — BP 140/76 | HR 51 | Ht 72.0 in | Wt 177.0 lb

## 2016-12-19 DIAGNOSIS — E7800 Pure hypercholesterolemia, unspecified: Secondary | ICD-10-CM | POA: Diagnosis not present

## 2016-12-19 DIAGNOSIS — I2510 Atherosclerotic heart disease of native coronary artery without angina pectoris: Secondary | ICD-10-CM | POA: Diagnosis not present

## 2016-12-19 DIAGNOSIS — I10 Essential (primary) hypertension: Secondary | ICD-10-CM | POA: Diagnosis not present

## 2016-12-19 NOTE — Telephone Encounter (Signed)
Patient calling to update that his preferred pharmacy is   [facility] [address] [place], [place]

## 2016-12-19 NOTE — Patient Instructions (Signed)
Medication Instructions:   DO NOT TAKE ATORVASTATIN FOR 4 WEEKS=CALL AFTER 4 WEEKS  Testing/Procedures:  Your physician has requested that you have en exercise stress myoview. For further information please visit [email or web address]. Please follow instruction sheet, as given. TAKE ALL MEDICATIONS    Follow-Up:  Your physician wants you to follow-up in: ONE YEAR WITH DR Shelda PalRENSHAW You will receive a reminder letter in the mail two months in advance. If you don't receive a letter, please call our office to schedule the follow-up appointment.   If you need a refill on your cardiac medications before your next appointment, please call your pharmacy.

## 2016-12-19 NOTE — Telephone Encounter (Signed)
Spoke with patient and he wanted to make sure that we had his pharmacy correct in his chart because [facility]. Assured patient that I would make the change. He voiced understanding.

## 2016-12-20 DIAGNOSIS — M62830 Muscle spasm of back: Secondary | ICD-10-CM | POA: Diagnosis not present

## 2016-12-20 DIAGNOSIS — M50322 Other cervical disc degeneration at C5-C6 level: Secondary | ICD-10-CM | POA: Diagnosis not present

## 2016-12-20 DIAGNOSIS — M542 Cervicalgia: Secondary | ICD-10-CM | POA: Diagnosis not present

## 2016-12-20 DIAGNOSIS — M9901 Segmental and somatic dysfunction of cervical region: Secondary | ICD-10-CM | POA: Diagnosis not present

## 2016-12-20 DIAGNOSIS — M9903 Segmental and somatic dysfunction of lumbar region: Secondary | ICD-10-CM | POA: Diagnosis not present

## 2016-12-20 DIAGNOSIS — M545 Low back pain: Secondary | ICD-10-CM | POA: Diagnosis not present

## 2016-12-20 DIAGNOSIS — M9905 Segmental and somatic dysfunction of pelvic region: Secondary | ICD-10-CM | POA: Diagnosis not present

## 2016-12-20 DIAGNOSIS — M5383 Other specified dorsopathies, cervicothoracic region: Secondary | ICD-10-CM | POA: Diagnosis not present

## 2016-12-20 DIAGNOSIS — M9902 Segmental and somatic dysfunction of thoracic region: Secondary | ICD-10-CM | POA: Diagnosis not present

## 2016-12-21 DIAGNOSIS — M62830 Muscle spasm of back: Secondary | ICD-10-CM | POA: Diagnosis not present

## 2016-12-21 DIAGNOSIS — M542 Cervicalgia: Secondary | ICD-10-CM | POA: Diagnosis not present

## 2016-12-21 DIAGNOSIS — M5383 Other specified dorsopathies, cervicothoracic region: Secondary | ICD-10-CM | POA: Diagnosis not present

## 2016-12-21 DIAGNOSIS — M50322 Other cervical disc degeneration at C5-C6 level: Secondary | ICD-10-CM | POA: Diagnosis not present

## 2016-12-21 DIAGNOSIS — M9905 Segmental and somatic dysfunction of pelvic region: Secondary | ICD-10-CM | POA: Diagnosis not present

## 2016-12-21 DIAGNOSIS — M545 Low back pain: Secondary | ICD-10-CM | POA: Diagnosis not present

## 2016-12-21 DIAGNOSIS — M9901 Segmental and somatic dysfunction of cervical region: Secondary | ICD-10-CM | POA: Diagnosis not present

## 2016-12-21 DIAGNOSIS — M9902 Segmental and somatic dysfunction of thoracic region: Secondary | ICD-10-CM | POA: Diagnosis not present

## 2016-12-21 DIAGNOSIS — M9903 Segmental and somatic dysfunction of lumbar region: Secondary | ICD-10-CM | POA: Diagnosis not present

## 2016-12-27 DIAGNOSIS — M9905 Segmental and somatic dysfunction of pelvic region: Secondary | ICD-10-CM | POA: Diagnosis not present

## 2016-12-27 DIAGNOSIS — M545 Low back pain: Secondary | ICD-10-CM | POA: Diagnosis not present

## 2016-12-27 DIAGNOSIS — M9903 Segmental and somatic dysfunction of lumbar region: Secondary | ICD-10-CM | POA: Diagnosis not present

## 2016-12-27 DIAGNOSIS — M50322 Other cervical disc degeneration at C5-C6 level: Secondary | ICD-10-CM | POA: Diagnosis not present

## 2016-12-27 DIAGNOSIS — M542 Cervicalgia: Secondary | ICD-10-CM | POA: Diagnosis not present

## 2016-12-27 DIAGNOSIS — M62830 Muscle spasm of back: Secondary | ICD-10-CM | POA: Diagnosis not present

## 2016-12-27 DIAGNOSIS — M9902 Segmental and somatic dysfunction of thoracic region: Secondary | ICD-10-CM | POA: Diagnosis not present

## 2016-12-27 DIAGNOSIS — M5383 Other specified dorsopathies, cervicothoracic region: Secondary | ICD-10-CM | POA: Diagnosis not present

## 2016-12-27 DIAGNOSIS — M9901 Segmental and somatic dysfunction of cervical region: Secondary | ICD-10-CM | POA: Diagnosis not present

## 2016-12-28 ENCOUNTER — Ambulatory Visit (HOSPITAL_COMMUNITY)
Admission: RE | Admit: 2016-12-28 | Discharge: 2016-12-28 | Disposition: A | Discharge status: Home / Self Care | Payer: Medicare HMO | Source: Ambulatory Visit | Attending: Internal Medicine | Admitting: Internal Medicine

## 2016-12-28 DIAGNOSIS — M542 Cervicalgia: Secondary | ICD-10-CM | POA: Diagnosis not present

## 2016-12-28 DIAGNOSIS — I2510 Atherosclerotic heart disease of native coronary artery without angina pectoris: Secondary | ICD-10-CM | POA: Diagnosis not present

## 2016-12-28 DIAGNOSIS — M5383 Other specified dorsopathies, cervicothoracic region: Secondary | ICD-10-CM | POA: Diagnosis not present

## 2016-12-28 DIAGNOSIS — M9903 Segmental and somatic dysfunction of lumbar region: Secondary | ICD-10-CM | POA: Diagnosis not present

## 2016-12-28 DIAGNOSIS — M9905 Segmental and somatic dysfunction of pelvic region: Secondary | ICD-10-CM | POA: Diagnosis not present

## 2016-12-28 DIAGNOSIS — M545 Low back pain: Secondary | ICD-10-CM | POA: Diagnosis not present

## 2016-12-28 DIAGNOSIS — M9902 Segmental and somatic dysfunction of thoracic region: Secondary | ICD-10-CM | POA: Diagnosis not present

## 2016-12-28 DIAGNOSIS — M62830 Muscle spasm of back: Secondary | ICD-10-CM | POA: Diagnosis not present

## 2016-12-28 DIAGNOSIS — Z8249 Family history of ischemic heart disease and other diseases of the circulatory system: Secondary | ICD-10-CM | POA: Diagnosis not present

## 2016-12-28 DIAGNOSIS — M50322 Other cervical disc degeneration at C5-C6 level: Secondary | ICD-10-CM | POA: Diagnosis not present

## 2016-12-28 DIAGNOSIS — M9901 Segmental and somatic dysfunction of cervical region: Secondary | ICD-10-CM | POA: Diagnosis not present

## 2016-12-28 LAB — MYOCARDIAL PERFUSION IMAGING
CHL CUP NUCLEAR SSS: 1
CHL RATE OF PERCEIVED EXERTION: 17
CSEPEW: 9.3 METS
Exercise duration (min): 9 min
Exercise duration (sec): 0 s
LV dias vol: 132 mL (ref 62–150)
LVSYSVOL: 72 mL
MPHR: 150 {beats}/min
NUC STRESS TID: 1.17
Peak HR: 133 {beats}/min
Percent HR: 88 %
Rest HR: 44 {beats}/min
SDS: 1
SRS: 0

## 2016-12-28 MED ORDER — TECHNETIUM TC 99M TETROFOSMIN IV KIT
31.1000 | PACK | Freq: Once | INTRAVENOUS | Status: AC | PRN
  Administered 2016-12-28: 31.1 via INTRAVENOUS
  Filled 2016-12-28: qty 32

## 2016-12-28 MED ORDER — TECHNETIUM TC 99M TETROFOSMIN IV KIT
10.4000 | PACK | Freq: Once | INTRAVENOUS | Status: AC | PRN
  Administered 2016-12-28: 10.4 via INTRAVENOUS
  Filled 2016-12-28: qty 11

## 2017-01-03 ENCOUNTER — Telehealth: Payer: Self-pay | Admitting: Cardiology

## 2017-01-03 DIAGNOSIS — M5383 Other specified dorsopathies, cervicothoracic region: Secondary | ICD-10-CM | POA: Diagnosis not present

## 2017-01-03 DIAGNOSIS — M9905 Segmental and somatic dysfunction of pelvic region: Secondary | ICD-10-CM | POA: Diagnosis not present

## 2017-01-03 DIAGNOSIS — M9902 Segmental and somatic dysfunction of thoracic region: Secondary | ICD-10-CM | POA: Diagnosis not present

## 2017-01-03 DIAGNOSIS — M545 Low back pain: Secondary | ICD-10-CM | POA: Diagnosis not present

## 2017-01-03 DIAGNOSIS — M542 Cervicalgia: Secondary | ICD-10-CM | POA: Diagnosis not present

## 2017-01-03 DIAGNOSIS — M50322 Other cervical disc degeneration at C5-C6 level: Secondary | ICD-10-CM | POA: Diagnosis not present

## 2017-01-03 DIAGNOSIS — M9903 Segmental and somatic dysfunction of lumbar region: Secondary | ICD-10-CM | POA: Diagnosis not present

## 2017-01-03 DIAGNOSIS — M9901 Segmental and somatic dysfunction of cervical region: Secondary | ICD-10-CM | POA: Diagnosis not present

## 2017-01-03 DIAGNOSIS — M62830 Muscle spasm of back: Secondary | ICD-10-CM | POA: Diagnosis not present

## 2017-01-03 NOTE — Telephone Encounter (Signed)
Overdue health maintenance & historical immunizations updated. Message sent to patient via MyChart to make him aware of these changes.

## 2017-01-03 NOTE — Telephone Encounter (Signed)
New message    Pt sent this information in an appointment request through MyChart.  Appointment Request From: Daymon Larsen    With Provider: Olga Millers, MD [facility]]    Preferred Date Range: Any    Preferred Times: Any    Reason: To address the following health maintenance concerns.  Hepatitis C Screening    Comments:     I have the dates for 4 of the above. I will have to get back to you later on Hepatitis C screening date.  If you want to add these 4 to my chart or do you need a letter faxed or emailed to you from Dr. Konrad Felix, my PCP?      Tetanus Vaccine - last one in 2013 as part of TDAP vaccine     Colon Cancer Screening - last one was 12/31/14     Pneumonia vaccine - Prevnar on 09/02/2013   and     Flu shot-- last one October 2017 -- next one at my physical in October 2018.Marland KitchenMarland KitchenThanks for updating these.

## 2017-01-11 DIAGNOSIS — M62830 Muscle spasm of back: Secondary | ICD-10-CM | POA: Diagnosis not present

## 2017-01-11 DIAGNOSIS — M9902 Segmental and somatic dysfunction of thoracic region: Secondary | ICD-10-CM | POA: Diagnosis not present

## 2017-01-11 DIAGNOSIS — M9901 Segmental and somatic dysfunction of cervical region: Secondary | ICD-10-CM | POA: Diagnosis not present

## 2017-01-11 DIAGNOSIS — M542 Cervicalgia: Secondary | ICD-10-CM | POA: Diagnosis not present

## 2017-01-11 DIAGNOSIS — M9905 Segmental and somatic dysfunction of pelvic region: Secondary | ICD-10-CM | POA: Diagnosis not present

## 2017-01-11 DIAGNOSIS — M5383 Other specified dorsopathies, cervicothoracic region: Secondary | ICD-10-CM | POA: Diagnosis not present

## 2017-01-11 DIAGNOSIS — M9903 Segmental and somatic dysfunction of lumbar region: Secondary | ICD-10-CM | POA: Diagnosis not present

## 2017-01-11 DIAGNOSIS — M545 Low back pain: Secondary | ICD-10-CM | POA: Diagnosis not present

## 2017-01-11 DIAGNOSIS — M50322 Other cervical disc degeneration at C5-C6 level: Secondary | ICD-10-CM | POA: Diagnosis not present

## 2017-01-17 ENCOUNTER — Telehealth: Payer: Self-pay | Admitting: Cardiology

## 2017-01-17 DIAGNOSIS — I2510 Atherosclerotic heart disease of native coronary artery without angina pectoris: Secondary | ICD-10-CM

## 2017-01-17 DIAGNOSIS — M9905 Segmental and somatic dysfunction of pelvic region: Secondary | ICD-10-CM | POA: Diagnosis not present

## 2017-01-17 DIAGNOSIS — M50322 Other cervical disc degeneration at C5-C6 level: Secondary | ICD-10-CM | POA: Diagnosis not present

## 2017-01-17 DIAGNOSIS — Z79899 Other long term (current) drug therapy: Secondary | ICD-10-CM

## 2017-01-17 DIAGNOSIS — M9902 Segmental and somatic dysfunction of thoracic region: Secondary | ICD-10-CM | POA: Diagnosis not present

## 2017-01-17 DIAGNOSIS — M9903 Segmental and somatic dysfunction of lumbar region: Secondary | ICD-10-CM | POA: Diagnosis not present

## 2017-01-17 DIAGNOSIS — E785 Hyperlipidemia, unspecified: Secondary | ICD-10-CM

## 2017-01-17 DIAGNOSIS — M62830 Muscle spasm of back: Secondary | ICD-10-CM | POA: Diagnosis not present

## 2017-01-17 DIAGNOSIS — M9901 Segmental and somatic dysfunction of cervical region: Secondary | ICD-10-CM | POA: Diagnosis not present

## 2017-01-17 DIAGNOSIS — M5383 Other specified dorsopathies, cervicothoracic region: Secondary | ICD-10-CM | POA: Diagnosis not present

## 2017-01-17 DIAGNOSIS — M545 Low back pain: Secondary | ICD-10-CM | POA: Diagnosis not present

## 2017-01-17 DIAGNOSIS — M542 Cervicalgia: Secondary | ICD-10-CM | POA: Diagnosis not present

## 2017-01-17 NOTE — Telephone Encounter (Signed)
New message    When he was in office 4 weeks ago , he stopped the lipitor, he was suppose to let Dr Jens Somrenshaw know if the muscle pains went away, and no the pain did not go away.   Please advise the next step

## 2017-01-17 NOTE — Telephone Encounter (Signed)
Returned call to patient, states he was having muscle pain and was instructed to stop for weeks and see if it helped.   Reports the pain did not go away and the pain is due to arthritis.      Per chart review: 3 Hyperlipidemia-patient is complaining of arthralgias predominantly. This may be secondary to arthritis. However I would like to make sure it is not related to his statin. We will hold Lipitor for 4 weeks. If his symptoms improve we will consider a different statin. If not his Lipitor will be resumed and we will check lipids and liver 6 weeks later   Patient agreed to restart and will have labs redrawn in 6 weeks at PCP.  Request lab orders mailed to him.     Labs ordered and mailed.

## 2017-01-31 DIAGNOSIS — M5383 Other specified dorsopathies, cervicothoracic region: Secondary | ICD-10-CM | POA: Diagnosis not present

## 2017-01-31 DIAGNOSIS — R69 Illness, unspecified: Secondary | ICD-10-CM | POA: Diagnosis not present

## 2017-01-31 DIAGNOSIS — M9901 Segmental and somatic dysfunction of cervical region: Secondary | ICD-10-CM | POA: Diagnosis not present

## 2017-01-31 DIAGNOSIS — M9905 Segmental and somatic dysfunction of pelvic region: Secondary | ICD-10-CM | POA: Diagnosis not present

## 2017-01-31 DIAGNOSIS — M50322 Other cervical disc degeneration at C5-C6 level: Secondary | ICD-10-CM | POA: Diagnosis not present

## 2017-01-31 DIAGNOSIS — M9902 Segmental and somatic dysfunction of thoracic region: Secondary | ICD-10-CM | POA: Diagnosis not present

## 2017-01-31 DIAGNOSIS — M542 Cervicalgia: Secondary | ICD-10-CM | POA: Diagnosis not present

## 2017-01-31 DIAGNOSIS — M62830 Muscle spasm of back: Secondary | ICD-10-CM | POA: Diagnosis not present

## 2017-01-31 DIAGNOSIS — M545 Low back pain: Secondary | ICD-10-CM | POA: Diagnosis not present

## 2017-01-31 DIAGNOSIS — M9903 Segmental and somatic dysfunction of lumbar region: Secondary | ICD-10-CM | POA: Diagnosis not present

## 2017-02-14 DIAGNOSIS — M50322 Other cervical disc degeneration at C5-C6 level: Secondary | ICD-10-CM | POA: Diagnosis not present

## 2017-02-14 DIAGNOSIS — M542 Cervicalgia: Secondary | ICD-10-CM | POA: Diagnosis not present

## 2017-02-14 DIAGNOSIS — M545 Low back pain: Secondary | ICD-10-CM | POA: Diagnosis not present

## 2017-02-14 DIAGNOSIS — R69 Illness, unspecified: Secondary | ICD-10-CM | POA: Diagnosis not present

## 2017-02-14 DIAGNOSIS — M5383 Other specified dorsopathies, cervicothoracic region: Secondary | ICD-10-CM | POA: Diagnosis not present

## 2017-02-14 DIAGNOSIS — M9903 Segmental and somatic dysfunction of lumbar region: Secondary | ICD-10-CM | POA: Diagnosis not present

## 2017-02-14 DIAGNOSIS — M9905 Segmental and somatic dysfunction of pelvic region: Secondary | ICD-10-CM | POA: Diagnosis not present

## 2017-02-14 DIAGNOSIS — M9901 Segmental and somatic dysfunction of cervical region: Secondary | ICD-10-CM | POA: Diagnosis not present

## 2017-02-14 DIAGNOSIS — M9902 Segmental and somatic dysfunction of thoracic region: Secondary | ICD-10-CM | POA: Diagnosis not present

## 2017-02-14 DIAGNOSIS — M62830 Muscle spasm of back: Secondary | ICD-10-CM | POA: Diagnosis not present

## 2017-03-07 DIAGNOSIS — M50322 Other cervical disc degeneration at C5-C6 level: Secondary | ICD-10-CM | POA: Diagnosis not present

## 2017-03-07 DIAGNOSIS — I10 Essential (primary) hypertension: Secondary | ICD-10-CM | POA: Diagnosis not present

## 2017-03-07 DIAGNOSIS — M9901 Segmental and somatic dysfunction of cervical region: Secondary | ICD-10-CM | POA: Diagnosis not present

## 2017-03-07 DIAGNOSIS — Z1331 Encounter for screening for depression: Secondary | ICD-10-CM | POA: Diagnosis not present

## 2017-03-07 DIAGNOSIS — Z0000 Encounter for general adult medical examination without abnormal findings: Secondary | ICD-10-CM | POA: Diagnosis not present

## 2017-03-07 DIAGNOSIS — Z125 Encounter for screening for malignant neoplasm of prostate: Secondary | ICD-10-CM | POA: Diagnosis not present

## 2017-03-07 DIAGNOSIS — Z79899 Other long term (current) drug therapy: Secondary | ICD-10-CM | POA: Diagnosis not present

## 2017-03-07 DIAGNOSIS — E559 Vitamin D deficiency, unspecified: Secondary | ICD-10-CM | POA: Diagnosis not present

## 2017-03-07 DIAGNOSIS — M9903 Segmental and somatic dysfunction of lumbar region: Secondary | ICD-10-CM | POA: Diagnosis not present

## 2017-03-07 DIAGNOSIS — Z1211 Encounter for screening for malignant neoplasm of colon: Secondary | ICD-10-CM | POA: Diagnosis not present

## 2017-03-07 DIAGNOSIS — M9902 Segmental and somatic dysfunction of thoracic region: Secondary | ICD-10-CM | POA: Diagnosis not present

## 2017-03-07 DIAGNOSIS — M9905 Segmental and somatic dysfunction of pelvic region: Secondary | ICD-10-CM | POA: Diagnosis not present

## 2017-03-07 DIAGNOSIS — E785 Hyperlipidemia, unspecified: Secondary | ICD-10-CM | POA: Diagnosis not present

## 2017-03-09 ENCOUNTER — Other Ambulatory Visit: Payer: Self-pay | Admitting: Cardiology

## 2017-03-09 DIAGNOSIS — I10 Essential (primary) hypertension: Secondary | ICD-10-CM

## 2017-03-09 HISTORY — PX: TRANSURETHRAL RESECTION OF PROSTATE: SHX73

## 2017-03-09 NOTE — Telephone Encounter (Signed)
Rx(s) sent to pharmacy electronically.  

## 2017-03-20 DIAGNOSIS — N5201 Erectile dysfunction due to arterial insufficiency: Secondary | ICD-10-CM | POA: Diagnosis not present

## 2017-03-20 DIAGNOSIS — N3020 Other chronic cystitis without hematuria: Secondary | ICD-10-CM | POA: Diagnosis not present

## 2017-03-20 DIAGNOSIS — R351 Nocturia: Secondary | ICD-10-CM | POA: Diagnosis not present

## 2017-03-20 DIAGNOSIS — N401 Enlarged prostate with lower urinary tract symptoms: Secondary | ICD-10-CM | POA: Diagnosis not present

## 2017-03-23 DIAGNOSIS — I509 Heart failure, unspecified: Secondary | ICD-10-CM | POA: Diagnosis not present

## 2017-03-23 DIAGNOSIS — Z955 Presence of coronary angioplasty implant and graft: Secondary | ICD-10-CM | POA: Diagnosis not present

## 2017-03-23 DIAGNOSIS — N138 Other obstructive and reflux uropathy: Secondary | ICD-10-CM | POA: Diagnosis not present

## 2017-03-23 DIAGNOSIS — R350 Frequency of micturition: Secondary | ICD-10-CM | POA: Diagnosis not present

## 2017-03-23 DIAGNOSIS — R39198 Other difficulties with micturition: Secondary | ICD-10-CM | POA: Diagnosis not present

## 2017-03-23 DIAGNOSIS — N410 Acute prostatitis: Secondary | ICD-10-CM | POA: Diagnosis not present

## 2017-03-23 DIAGNOSIS — R3915 Urgency of urination: Secondary | ICD-10-CM | POA: Diagnosis not present

## 2017-03-23 DIAGNOSIS — R351 Nocturia: Secondary | ICD-10-CM | POA: Diagnosis not present

## 2017-03-23 DIAGNOSIS — N411 Chronic prostatitis: Secondary | ICD-10-CM | POA: Diagnosis not present

## 2017-03-23 DIAGNOSIS — N401 Enlarged prostate with lower urinary tract symptoms: Secondary | ICD-10-CM | POA: Diagnosis not present

## 2017-03-24 DIAGNOSIS — Z955 Presence of coronary angioplasty implant and graft: Secondary | ICD-10-CM | POA: Diagnosis not present

## 2017-03-24 DIAGNOSIS — R39198 Other difficulties with micturition: Secondary | ICD-10-CM | POA: Diagnosis not present

## 2017-03-24 DIAGNOSIS — R3915 Urgency of urination: Secondary | ICD-10-CM | POA: Diagnosis not present

## 2017-03-24 DIAGNOSIS — N411 Chronic prostatitis: Secondary | ICD-10-CM | POA: Diagnosis not present

## 2017-03-24 DIAGNOSIS — R351 Nocturia: Secondary | ICD-10-CM | POA: Diagnosis not present

## 2017-03-24 DIAGNOSIS — I509 Heart failure, unspecified: Secondary | ICD-10-CM | POA: Diagnosis not present

## 2017-03-24 DIAGNOSIS — N401 Enlarged prostate with lower urinary tract symptoms: Secondary | ICD-10-CM | POA: Diagnosis not present

## 2017-03-24 DIAGNOSIS — R350 Frequency of micturition: Secondary | ICD-10-CM | POA: Diagnosis not present

## 2017-03-24 DIAGNOSIS — N138 Other obstructive and reflux uropathy: Secondary | ICD-10-CM | POA: Diagnosis not present

## 2017-03-24 DIAGNOSIS — N410 Acute prostatitis: Secondary | ICD-10-CM | POA: Diagnosis not present

## 2017-03-27 DIAGNOSIS — N401 Enlarged prostate with lower urinary tract symptoms: Secondary | ICD-10-CM | POA: Diagnosis not present

## 2017-03-27 DIAGNOSIS — N3020 Other chronic cystitis without hematuria: Secondary | ICD-10-CM | POA: Diagnosis not present

## 2017-03-28 DIAGNOSIS — H524 Presbyopia: Secondary | ICD-10-CM | POA: Diagnosis not present

## 2017-03-28 DIAGNOSIS — H26493 Other secondary cataract, bilateral: Secondary | ICD-10-CM | POA: Diagnosis not present

## 2017-04-03 DIAGNOSIS — N401 Enlarged prostate with lower urinary tract symptoms: Secondary | ICD-10-CM | POA: Diagnosis not present

## 2017-04-03 DIAGNOSIS — N318 Other neuromuscular dysfunction of bladder: Secondary | ICD-10-CM | POA: Diagnosis not present

## 2017-04-03 DIAGNOSIS — N3020 Other chronic cystitis without hematuria: Secondary | ICD-10-CM | POA: Diagnosis not present

## 2017-04-03 DIAGNOSIS — N3090 Cystitis, unspecified without hematuria: Secondary | ICD-10-CM | POA: Diagnosis not present

## 2017-04-13 DIAGNOSIS — Z0101 Encounter for examination of eyes and vision with abnormal findings: Secondary | ICD-10-CM | POA: Diagnosis not present

## 2017-04-18 DIAGNOSIS — N318 Other neuromuscular dysfunction of bladder: Secondary | ICD-10-CM | POA: Diagnosis not present

## 2017-04-18 DIAGNOSIS — N3020 Other chronic cystitis without hematuria: Secondary | ICD-10-CM | POA: Diagnosis not present

## 2017-04-18 DIAGNOSIS — N3090 Cystitis, unspecified without hematuria: Secondary | ICD-10-CM | POA: Diagnosis not present

## 2017-04-18 DIAGNOSIS — N401 Enlarged prostate with lower urinary tract symptoms: Secondary | ICD-10-CM | POA: Diagnosis not present

## 2017-04-20 DIAGNOSIS — R220 Localized swelling, mass and lump, head: Secondary | ICD-10-CM | POA: Diagnosis not present

## 2017-04-27 ENCOUNTER — Encounter: Payer: Self-pay | Admitting: Sports Medicine

## 2017-04-27 ENCOUNTER — Ambulatory Visit: Payer: Medicare HMO | Admitting: Sports Medicine

## 2017-04-27 ENCOUNTER — Ambulatory Visit (INDEPENDENT_AMBULATORY_CARE_PROVIDER_SITE_OTHER): Payer: Medicare HMO

## 2017-04-27 VITALS — BP 145/73 | HR 66 | Ht 72.0 in | Wt 178.0 lb

## 2017-04-27 DIAGNOSIS — M79672 Pain in left foot: Secondary | ICD-10-CM

## 2017-04-27 DIAGNOSIS — M79671 Pain in right foot: Secondary | ICD-10-CM | POA: Diagnosis not present

## 2017-04-27 DIAGNOSIS — M2142 Flat foot [pes planus] (acquired), left foot: Secondary | ICD-10-CM

## 2017-04-27 DIAGNOSIS — M2141 Flat foot [pes planus] (acquired), right foot: Secondary | ICD-10-CM

## 2017-04-27 DIAGNOSIS — R29898 Other symptoms and signs involving the musculoskeletal system: Secondary | ICD-10-CM

## 2017-04-27 DIAGNOSIS — L909 Atrophic disorder of skin, unspecified: Secondary | ICD-10-CM

## 2017-04-27 DIAGNOSIS — M21619 Bunion of unspecified foot: Secondary | ICD-10-CM

## 2017-04-27 DIAGNOSIS — M2040 Other hammer toe(s) (acquired), unspecified foot: Secondary | ICD-10-CM

## 2017-04-27 NOTE — Progress Notes (Signed)
Subjective: Jeremy Simon is a 71 y.o. male patient who presents to office for evaluation of both feet. Patient states that he wants to make sure he is doing the right thing for his feet. States he has symptoms of old age. A little stiffness and unable to walk on hard surfaces especially when he is barefoot. States that his toes rub and the spacers that he has helps. Patient denies any other pedal complaints.   Review of Systems  All other systems reviewed and are negative.   Patient Active Problem List   Diagnosis Date Noted  . Abnormal stress echocardiogram 05/21/2014  . CHEST PAIN 01/30/2009  . HYPERLIPIDEMIA 01/28/2009  . Essential hypertension 01/28/2009  . Coronary atherosclerosis 01/28/2009  . HIATAL HERNIA 01/28/2009   Current Outpatient Medications on File Prior to Visit  Medication Sig Dispense Refill  . aspirin 325 MG tablet Take 325 mg by mouth daily.      Marland Kitchen. atorvastatin (LIPITOR) 40 MG tablet Take 40 mg by mouth daily.    . calcium carbonate (OS-CAL) 600 MG TABS tablet Take 600 mg by mouth daily with breakfast.     . Cholecalciferol ([facility] VIT D 5000 HIGH-POTENCY) 5000 UNITS capsule Take 2,000 Units by mouth daily.     . fluticasone (FLONASE) 50 MCG/ACT nasal spray     . hydroxypropyl methylcellulose / hypromellose (ISOPTO TEARS / GONIOVISC) 2.5 % ophthalmic solution Place 2 drops into both eyes at bedtime.    . metoprolol succinate (TOPROL-XL) 25 MG 24 hr tablet TAKE 1 TABLET BY MOUTH EVERY DAY 90 tablet 1  . betamethasone dipropionate (DIPROLENE) 0.05 % cream Apply 1 application topically as needed.  3  . finasteride (PROSCAR) 5 MG tablet Take 5 mg by mouth daily.    Marland Kitchen. gabapentin (NEURONTIN) 300 MG capsule Take 300 mg by mouth daily.    . ramipril (ALTACE) 10 MG capsule TAKE ONE CAPSULE DAILY (Patient not taking: Reported on 04/27/2017) 90 capsule 2  . Tamsulosin HCl (FLOMAX) 0.4 MG CAPS Take 0.8 mg by mouth daily. PRN     No current facility-administered medications on  file prior to visit.    No Known Allergies   Objective:  General: Alert and oriented x3 in no acute distress  Dermatology: No open lesions bilateral lower extremities, no webspace macerations, no ecchymosis bilateral, all nails x 10 are well manicured. Minimal pinch callus at sides of big toe joints bilateral.  Vascular: Dorsalis Pedis and Posterior Tibial pedal pulses 1/4, Capillary Fill Time 3 seconds, (+) pedal hair growth bilateral, no edema bilateral lower extremities, Temperature gradient within normal limits.  Neurology: Michaell CowingGross sensation intact via light touch bilateral.   Musculoskeletal: No tenderness with palpation along medial arch, medial fascial band,no tenderness along Posterior tibial tendon course with medial soft tissue buldge noted, there is decreased ankle rom with knee extending  vs flexed resembling gastroc equnius bilateral, Midtarsal joint limited. + Fat pad atrophy, bunion, and hammertoe deformity. + Medial arch collapse with forefoot abduction significant for pes planus bilateral.  Xrays Right/Left foot:  Normal osseous mineralization. Joint narrowing at the midtarsal joint with midtarsal breech suggestive of arthritis. No fracture/dislocation/boney destruction.  There is diffuse arthritis at the ankle and over bunion and hammertoe deformity.  No soft tissue abnormalities or radiopaque foreign bodies.   Assessment and Plan: Problem List Items Addressed This Visit    None    Visit Diagnoses    Hammer toe, unspecified laterality    -  Primary   Relevant  Orders   DG Foot Complete Right   DG Foot Complete Left   Bunion       Pes planus of both feet       Fat pad atrophy of foot       Foot pain, bilateral          -Complete examination performed -Xrays reviewed -Discussed treatement options; discussed pes planus, bunion, hammertoe, callus and fat pad atrophy secondary to foot type -Recommend continue with toe separators -Recommend orthotic offered power step  patient will like to wait on this option -Recommend good supportive shoes, shoe list was given -Recommend skin emollients for callus areas -No acute indication for surgery  -Patient to return to office as needed or sooner if condition worsens.  Jeremy Simon, DPM

## 2017-04-27 NOTE — Patient Instructions (Signed)
Shoe Recommendations For tennis shoes recommend:  Anne ShutterBrooks Beast Ascis New balance [place] Can be purchased at [employer] or [employer]  Vionic  SAS Can be purchased at [employer] or [employer]   For work shoes recommend: Pharmacologistketchers [employer] boots  Can be purchased at a variety of places or [employer]ist, product[employer]process [employer]   For casual shoes recommend: Vionic  Can be purchased at [employer] or [employer]  Flat Feet, Adult Normally, a foot has a curve, called an arch, on its inner side. The arch creates a gap between the foot and the ground. Flat feet is a common condition in which one or both feet do not have an arch. What are the causes? This condition may be caused by:  Failure of a normal arch to develop during childhood.  An injury to tendons and ligaments in the foot, such as to the tendon that supports the arch (posterior tibial tendon).  Loose tendons or ligaments in the foot.  A wearing down of the arch over time.  Injury to bones in the foot.  An abnormality in the bones of the foot, called tarsal coalition. This happens when two or more bones in the foot are joined together (fused) before birth.  What increases the risk? This condition is more likely to develop in:  Females.  Adults age 70 or older.  People who: ? Have a family history of flat feet. ? Have a history of childhood flexible flatfoot. ? Are obese. ? Have diabetes. ? Have high blood pressure. ? Participate in high-impact sports. ? Have inflammatory arthritis. ? Have a history of broken (fractured) or dislocated bones in the foot.  What are the signs or symptoms? Symptoms of this condition include:  Pain or tightness along the bottom of the foot.  Foot pain that gets worse with activity.  Swelling of the inner side of the foot.  Swelling of the ankle.  Pain on the outer side of the ankle.  Changes in the way that you walk (gait).  Pronation. This is when the foot and ankle lean inward when you are  standing.  Bony bumps on the top or inner side of the foot.  How is this diagnosed? This condition is diagnosed with a physical exam of your foot and ankle. Your health care provider may also:  Look at your shoes for patterns of wear on the soles.  Order imaging tests, such as X-rays, a CT scan, or an MRI.  Refer you to a health care provider who specializes in feet (podiatrist) or a physical therapist.  How is this treated? This condition may be treated with:  Stretching exercises or physical therapy. This helps to increase range of motion and relieve pain.  A shoe insert (orthotic). This helps to support the arch of your foot. Orthotics can be purchased from a store or can be custom-made by your health care provider.  Wearing shoes with appropriate arch support. This is especially important for athletes.  Medicines. These may be prescribed to relieve pain.  An ankle brace, boot, or cast. These may be used to relieve pressure on your foot. You may be given crutches if walking is painful.  Surgery. This may be done to improve the alignment of your foot. This is only needed if your posterior tibial tendon is torn or if you have tarsal coalition.  Follow these instructions at home: Activity  Do any exercises as told by your health care provider.  If an activity causes pain, avoid it or try  to find another activity that does not cause pain. General instructions  Wear orthotics and appropriate shoes as told by your health care provider.  Take over-the-counter and prescription medicines only as told by your health care provider.  Wear an ankle brace, boot, or cast as told by your health care provider.  Use crutches as told by your health care provider.  Keep all follow-up visits as told by your health care provider. This is important. How is this prevented? To prevent the condition from getting worse:  Wear comfortable, supportive shoes that are appropriate for your  activities.  Maintain a healthy weight.  Stay active in a way that your health care provider recommends. This will help to keep your feet flexible and strong.  Manage long-term (chronic) health conditions, such as diabetes, high blood pressure, and inflammatory arthritis.  Work with a health care provider if you have concerns about your feet or shoes.  Contact a health care provider if:  You have pain in your foot or lower leg that gets worse or does not improve with medicine.  You have pain or difficulty when walking.  You have problems with your orthotics. Summary  Flat feet is a common condition in which one or both feet do not have a curve, called an arch, on the inner side.  Your health care provider may recommend a shoe insert (orthotic) or shoes with the appropriate arch support.  Other treatments may include stretching exercises or physical therapy, medicines to relieve pain, and wearing an ankle brace, boot, or cast.  Surgery may be done if you have a tear in the tendon that supports your arch (posterior tibial tendon) or if two or more of your foot bones were joined together (fused)  before birth (tarsal coalition). This information is not intended to replace advice given to you by your health care provider. Make sure you discuss any questions you have with your health care provider. Document Released: 02/20/2009 Document Revised: 07/06/2016 Document Reviewed: 07/06/2016 Elsevier Interactive Patient Education  2018 [employer].    Bunion A bunion is a bump on the base of the big toe that forms when the bones of the big toe joint move out of position. Bunions may be small at first, but they often get larger over time. The can make walking painful. What are the causes? A bunion may be caused by:  Wearing narrow or pointed shoes that force the big toe to press against the other toes.  Abnormal foot development that causes the foot to roll inward (pronate).  Changes  in the foot that are caused by certain diseases, such as rheumatoid arthritis and polio.  A foot injury.  What increases the risk? The following factors may make you more likely to develop this condition:  Wearing shoes that squeeze the toes together.  Having certain diseases, such as: ? Rheumatoid arthritis. ? Polio. ? Cerebral palsy.  Having family members who have bunions.  Being born with a foot deformity, such as flat feet or low arches.  Doing activities that put a lot of pressure on the feet, such as ballet dancing.  What are the signs or symptoms? The main symptom of a bunion is a noticeable bump on the big toe. Other symptoms may include:  Pain.  Swelling around the big toe.  Redness and inflammation.  Thick or hardened skin on the big toe or between the toes.  Stiffness or loss of motion in the big toe.  Trouble with walking.  How is this diagnosed? A bunion may be diagnosed based on your symptoms, medical history, and activities. You may have tests, such as:  X-rays. These allow your health care provider to check the position of the bones in your foot and look for damage to your joint. They also help your health care provider to determine the severity of your bunion and the best way to treat it.  Joint aspiration. In this test, a sample of fluid is removed from the toe joint. This test, which may be done if you are in a lot of pain, helps to rule out diseases that cause painful swelling of the joints, such as arthritis.  How is this treated? There is no cure for a bunion, but treatment can help to prevent a bunion from getting worse. Treatment depends on the severity of your symptoms. Your health care provider may recommend:  Wearing shoes that have a wide toe box.  Using bunion pads to cushion the affected area.  Taping your toes together to keep them in a normal position.  Placing a device inside your shoe (orthotics) to help reduce pressure on your toe  joint.  Taking medicine to ease pain, inflammation, and swelling.  Applying heat or ice to the affected area.  Doing stretching exercises.  Surgery to remove scar tissue and move the toes back into their normal position. This treatment is rare.  Follow these instructions at home:  Support your toe joint with proper footwear, shoe padding, or taping as told by your health care provider.  Take over-the-counter and prescription medicines only as told by your health care provider.  If directed, apply ice to the injured area: ? Put ice in a plastic bag. ? Place a towel between your skin and the bag. ? Leave the ice on for 20 minutes, 2-3 times per day.  If directed, apply heat to the affected area before you exercise. Use the heat source that your health care provider recommends, such as a moist heat pack or a heating pad. ? Place a towel between your skin and the heat source. ? Leave the heat on for 20-30 minutes. ? Remove the heat if your skin turns bright red. This is especially important if you are unable to feel pain, heat, or cold. You may have a greater risk of getting burned.  Do exercises as told by your health care provider.  Keep all follow-up visits as told by your health care provider. Contact a health care provider if:  Your symptoms get worse.  Your symptoms do not improve in 2 weeks. Get help right away if:  You have severe pain and trouble with walking. This information is not intended to replace advice given to you by your health care provider. Make sure you discuss any questions you have with your health care provider. Document Released: 04/25/2005 Document Revised: 10/01/2015 Document Reviewed: 11/23/2014 Elsevier Interactive Patient Education  2018 Elsevier Inc.  Hammer Toe Hammer toe is a change in the shape (a deformity) of your second, third, or fourth toe. The deformity causes the middle joint of your toe to stay bent. This causes pain, especially when  you are wearing shoes. Hammer toe starts gradually. At first, the toe can be straightened. Gradually over time, the deformity becomes stiff and permanent. Early treatments to keep the toe straight may relieve pain. As the deformity becomes stiff and permanent, surgery may be needed to straighten the toe. What are the causes? Hammer toe is caused by abnormal bending  of the toe joint that is closest to your foot. It happens gradually over time. This pulls on the muscles and connections (tendons) of the toe joint, making them weak and stiff. It is often related to wearing shoes that are too short or narrow and do not let your toes straighten. What increases the risk? You may be at greater risk for hammer toe if you:  Are male.  Are older.  Wear shoes that are too small.  Wear high-heeled shoes that pinch your toes.  Are a Advertising account planner.  Have a second toe that is longer than your big toe (first toe).  Injure your foot or toe.  Have arthritis.  Have a family history of hammer toe.  Have a nerve or muscle disorder.  What are the signs or symptoms? The main symptoms of this condition are pain and deformity of the toe. The pain is worse when wearing shoes, walking, or running. Other symptoms may include:  Corns or calluses over the bent part of the toe or between the toes.  Redness and a burning feeling on the toe.  An open sore that forms on the top of the toe.  Not being able to straighten the toe.  How is this diagnosed? This condition is diagnosed based on your symptoms and a physical exam. During the exam, your health care provider will try to straighten your toe to see how stiff the deformity is. You may also have tests, such as:  A blood test to check for rheumatoid arthritis.  An X-ray to show how severe the deformity is.  How is this treated? Treatment for this condition will depend on how stiff the deformity is. Surgery is often needed. However, sometimes a hammer  toe can be straightened without surgery. Treatments that do not involve surgery include:  Taping the toe into a straightened position.  Using pads and cushions to protect the toe (orthotics).  Wearing shoes that provide enough room for the toes.  Doing toe-stretching exercises at home.  Taking an NSAID to reduce pain and swelling.  If these treatments do not help or the toe cannot be straightened, surgery is the next option. The most common surgeries used to straighten a hammer toe include:  Arthroplasty. In this procedure, part of the joint is removed, and that allows the toe to straighten.  Fusion. In this procedure, cartilage between the two bones of the joint is taken out and the bones are fused together into one longer bone.  Implantation. In this procedure, part of the bone is removed and replaced with an implant to let the toe move again.  Flexor tendon transfer. In this procedure, the tendons that curl the toes down (flexor tendons) are repositioned.  Follow these instructions at home:  Take over-the-counter and prescription medicines only as told by your health care provider.  Do toe straightening and stretching exercises as told by your health care provider.  Keep all follow-up visits as told by your health care provider. This is important. How is this prevented?  Wear shoes that give your toes enough room and do not cause pain.  Do not wear high-heeled shoes. Contact a health care provider if:  Your pain gets worse.  Your toe becomes red or swollen.  You develop an open sore on your toe. This information is not intended to replace advice given to you by your health care provider. Make sure you discuss any questions you have with your health care provider. Document Released: 04/22/2000 Document  Revised: 11/13/2015 Document Reviewed: 08/19/2015 Elsevier Interactive Patient Education  [employer]2018 Elsevier Inc.

## 2017-04-28 ENCOUNTER — Telehealth: Payer: Self-pay | Admitting: Cardiology

## 2017-04-28 NOTE — Telephone Encounter (Signed)
Pt called requesting a call back.  Pt is having an allergic..... reaction to one of his medications   RAMIPRIL 10mg ??? Would like to know what to do to stop the itching.    Please give pt a call back.

## 2017-04-28 NOTE — Telephone Encounter (Signed)
Returned call to patient.He stated for the past 2 weeks he has been itching.He has whelps appear on buttocks and lower abdomen.He saw PCP she advised him to ask if Ramipril would be causing.Stated he has been taking for a long time.Advised I will send message to our pharmacist.

## 2017-04-28 NOTE — Telephone Encounter (Signed)
Returned call to patient left Kristin's advice on personal voice mail.

## 2017-04-28 NOTE — Telephone Encounter (Signed)
Not likely to be ramipril unless manufacturer has recently changed and patient has allergy to inactive ingredients.   Also, if it were to med, rash would likely be whole body not just specific area

## 2017-05-05 DIAGNOSIS — Z1331 Encounter for screening for depression: Secondary | ICD-10-CM | POA: Diagnosis not present

## 2017-05-05 DIAGNOSIS — E785 Hyperlipidemia, unspecified: Secondary | ICD-10-CM | POA: Diagnosis not present

## 2017-05-05 DIAGNOSIS — Z136 Encounter for screening for cardiovascular disorders: Secondary | ICD-10-CM | POA: Diagnosis not present

## 2017-05-05 DIAGNOSIS — Z0000 Encounter for general adult medical examination without abnormal findings: Secondary | ICD-10-CM | POA: Diagnosis not present

## 2017-05-07 ENCOUNTER — Encounter: Payer: Self-pay | Admitting: Cardiology

## 2017-05-11 ENCOUNTER — Ambulatory Visit: Payer: Medicare HMO | Admitting: Allergy

## 2017-05-11 ENCOUNTER — Encounter: Payer: Self-pay | Admitting: Allergy

## 2017-05-11 VITALS — BP 130/72 | HR 60 | Temp 97.7°F | Resp 12 | Ht 72.6 in | Wt 176.4 lb

## 2017-05-11 DIAGNOSIS — T783XXD Angioneurotic edema, subsequent encounter: Secondary | ICD-10-CM | POA: Diagnosis not present

## 2017-05-11 DIAGNOSIS — L508 Other urticaria: Secondary | ICD-10-CM | POA: Diagnosis not present

## 2017-05-11 NOTE — Patient Instructions (Addendum)
Hives and swelling    - at this time etiology of hives and swelling is unknown.  Hives can be caused by a variety of different triggers including illness/infection, foods, medications, stings, exercise, pressure, vibrations, extremes of temperature to name a few however majority of the time there is no identifiable trigger.  Your symptoms have been ongoing for 6 weeks making this chronic thus will obtain labwork to evaluate: CBC w diff, CMP, tryptase, hive panel, environmental panel, alpha-gal panel, tomato IgE level.  We will call you in about a week once all the labs return.     - start Zyrtec 10mg  twice a day (in AM and PM) and Zantac 150mg  twice a day (in AM and PM)    - let us know if hives leave any marks or bruising, you have joint aches/pains with hives or you develop fevers with hives    - hives and swelling like you have been having rarely progress to life-threatening symptoms thus epinephrine device is typically not warranted in this situation.       - agree with stopping Ramipril and avoiding medications in the class (ACE inhibitors) as they are known triggers of mouth/tongue swelling.    Follow-up 2-3 months or sooner if needed

## 2017-05-11 NOTE — Progress Notes (Signed)
New Patient Note  RE: Jeremy Simon MRN: [id number] DOB: October 12, 1945 Date of Office Visit: 05/11/2017  Referring provider: Doreene Elandhomas, Millard B, MD Primary care provider: Doreene Elandhomas, Millard B, MD  Chief Complaint: hives and lip swelling  History of present illness: Jeremy Simon is a 72 y.o. male presenting today for consultation for lip swelling and hives.  He presents today with his wife.  He states he had a reaction "out of the blue".  He reports having lower lip swelling first on 04/20/17 and then he had another episode involving upper lip swelling on 05/01/17.  He reports the swelling never involved the tongue and he denies any difficulty swallowing or breathing.  The swelling would subside over hours.  He also has been having hives.  He feels he may have been having some hives even 2 weeks or so prior to onset of swelling.  Since the lip swelling he has been more aware of the hives.  Hives pop up in different areas and are very itchy.  Hives are usually gone by the next day and do not leave any marks/bruises.  He denies any fevers or joint aches/pains with the hives and swelling.  He does note the top of foot has been swollen with redness and itchiness as well. He denies any previous episodes of swelling but does recall having hives many years ago when he was younger related to washing detergent.  He denies any new foods or changes in his diet except wife is concerned about a particular tomato that was used in a casserole that he ate both days of the lip swelling.  She reports the tomatoes were smaller on the vine tomatoes that were to "last longer" than normal tomatoes.  He does not eat red meat products often but reports having ham on Christmas.  He denies any tick bites that he is aware of.  He has not had any new medications but was on ramipril for 18 years and stopped this on Monday of this week under PCP's recommendation.  He has had no stings, change in lotions/soaps/detergents.  No  preceding illnesses.  He did have a TURP on 11/15 and was prescribed antibiotics for prevention of infection.  Did not require any opiate pain medication and denies any NSAID use other than daily Aspirin which he has been on year many years. For treatment of the hives and swelling he has tried benadryl which he did not feel was that effective.   He was provided with a Epipen prescription but he has not had it filled.     He does report seasonal allergies with symptoms including nasal congestion and drainage for which he takes flonase seasonally.     He denies history of asthma, eczema or food allergy.    Review of systems: Review of Systems  Constitutional: Negative for chills, fever and malaise/fatigue.  HENT: Negative for congestion, ear discharge, ear pain, nosebleeds, sinus pain, sore throat and tinnitus.   Eyes: Negative for pain, discharge and redness.  Respiratory: Negative for cough, shortness of breath and wheezing.   Cardiovascular: Negative for chest pain.  Gastrointestinal: Negative for abdominal pain, constipation, diarrhea, heartburn, nausea and vomiting.  Musculoskeletal: Negative for joint pain and myalgias.  Skin: Positive for itching and rash.  Neurological: Negative for loss of consciousness and headaches.    All other systems negative unless noted above in HPI  Past medical history: Past Medical History:  Diagnosis Date  . CAD   . HIATAL HERNIA   .  HYPERLIPIDEMIA   . HYPERTENSION     Past surgical history: Past Surgical History:  Procedure Laterality Date  . CARDIAC CATHETERIZATION    . NOSE SURGERY    . TRANSURETHRAL RESECTION OF PROSTATE  03/2017    Family history:  Family History  Problem Relation Age of Onset  . Alzheimer's disease Mother        SOME TYPE OF VALUE DISORDER  . Heart attack Father   . Hypertension Father   . Obesity Father   . Hypertension Sister   . Diabetes Sister   . Obesity Sister   . Other Son        NO HEALTH PROBLEMS  .  Other Daughter        NO HEALTH PROBLEMS    Social history: Lives in a home with his wife in a home with carpeting in the bedroom with gas heating and central cooling.  There are no pets in the home.  There is no concern for water damage, mildew or roaches in the home.  He is retired.  He denies a smoking history.   Medication List: Allergies as of 05/11/2017   No Known Allergies     Medication List        Accurate as of 05/11/17  7:30 PM. Always use your most recent med list.          aspirin 325 MG tablet Take 325 mg by mouth daily.   atorvastatin 40 MG tablet Commonly known as:  LIPITOR Take 40 mg by mouth daily.   calcium carbonate 600 MG Tabs tablet Commonly known as:  OS-CAL Take 600 mg by mouth daily with breakfast.   [employer] VIT D 5000 HIGH-POTENCY 5000 units capsule Generic drug:  Cholecalciferol Take 2,000 Units by mouth daily.   diphenhydrAMINE 25 mg capsule Commonly known as:  BENADRYL Take 25 mg by mouth every 4 (four) hours as needed.   fluticasone 50 MCG/ACT nasal spray Commonly known as:  FLONASE   hydrocortisone cream 1 % Apply 1 application topically as needed for itching.   hydroxypropyl methylcellulose / hypromellose 2.5 % ophthalmic solution Commonly known as:  ISOPTO TEARS / GONIOVISC Place 2 drops into both eyes at bedtime.   metoprolol succinate 25 MG 24 hr tablet Commonly known as:  TOPROL-XL TAKE 1 TABLET BY MOUTH EVERY DAY   ramipril 10 MG capsule Commonly known as:  ALTACE TAKE ONE CAPSULE DAILY   sildenafil 20 MG tablet Commonly known as:  REVATIO TAKE 1-4 TABLETS BY MOUTH AS NEEDED       Known medication allergies: No Known Allergies   Physical examination: Blood pressure 130/72, pulse 60, temperature 97.7 F (36.5 C), temperature source Oral, resp. rate 12, height 6' 0.6" (1.844 m), weight 176 lb 6.4 oz (80 kg).  General: Alert, interactive, in no acute distress. HEENT: PERRLA, TMs pearly gray, turbinates minimally  edematous without discharge, post-pharynx non erythematous. Neck: Supple without lymphadenopathy. Lungs: Clear to auscultation without wheezing, rhonchi or rales. {no increased work of breathing. CV: Normal S1, S2 without murmurs. Abdomen: Nondistended, nontender. Skin: Scattered erythematous urticarial type lesions primarily located b/l trunks , nonvesicular. Extremities:  No clubbing, cyanosis or edema. Neuro:   Grossly intact.  Diagnositics/Labs:  Allergy testing: None today due to active urticaria  Assessment and plan: Urticaria with angioedema    - at this time etiology of hives and swelling is unknown.  Hives can be caused by a variety of different triggers including illness/infection, foods, medications, stings, exercise, pressure,  vibrations, extremes of temperature to name a few however majority of the time there is no identifiable trigger.  Your symptoms have been ongoing for 6 weeks making this chronic thus will obtain labwork to evaluate: CBC w diff, CMP, tryptase, hive panel, environmental panel, alpha-gal panel, tomato IgE level.  We will call you in about a week once all the labs return.     - start Zyrtec 10mg  twice a day (in AM and PM) and Zantac 150mg  twice a day (in AM and PM)    - let us know if hives leave any marks or bruising, you have joint aches/pains with hives or you develop fevers with hives    - hives and swelling like you have been having rarely progress to life-threatening symptoms thus epinephrine device is typically not warranted in this situation.       - agree with stopping Ramipril and avoiding medications in the class (ACE inhibitors) as they are known triggers of mouth/tongue swelling.       -I briefly discussed Xolair with patient today and will have further discussion at follow-up if he still continues to have episodes despite high-dose antihistamine regimen.  Follow-up 2-3 months or sooner if needed   I appreciate the opportunity to take part in  Jonan's care. Please do not hesitate to contact me with questions.  Sincerely,   Margo Aye, MD Allergy/Immunology [facility]

## 2017-05-12 ENCOUNTER — Other Ambulatory Visit: Payer: Self-pay

## 2017-05-12 MED ORDER — RANITIDINE HCL 150 MG PO TABS: 150.0000 mg | ORAL_TABLET | Freq: Two times a day (BID) | ORAL | 5 refills | Status: DC

## 2017-05-12 MED ORDER — CETIRIZINE HCL 10 MG PO TABS: 10.0000 mg | ORAL_TABLET | Freq: Two times a day (BID) | ORAL | 5 refills | Status: DC

## 2017-05-12 NOTE — Telephone Encounter (Signed)
Patient walk in to request printed prescriptions on Zyrtec and Zantac. Will have to be signed by Thurston HoleAnne because Dr. Delorse LekPadgett is not in the office today.

## 2017-05-18 LAB — ALLERGENS, ZONE 2
Alternaria Alternata IgE: <0.1 kU/L
Bermuda Grass IgE: <0.1 kU/L
Cat Dander IgE: <0.1 kU/L
Cladosporium Herbarum IgE: <0.1 kU/L
D001-IGE D PTERONYSSINUS: 0.11 kU/L — AB
Dog Dander IgE: <0.1 kU/L
Hickory, White IgE: <0.1 kU/L
Maple/Box Elder IgE: <0.1 kU/L
Mugwort IgE Qn: <0.1 kU/L
Nettle IgE: <0.1 kU/L
Penicillium Chrysogen IgE: <0.1 kU/L
Pigweed, Rough IgE: <0.1 kU/L
Sheep Sorrel IgE Qn: <0.1 kU/L
Stemphylium Herbarum IgE: <0.1 kU/L
Sweet gum IgE RAST Ql: <0.1 kU/L
White Mulberry IgE: <0.1 kU/L

## 2017-05-18 LAB — CBC WITH DIFFERENTIAL/PLATELET
Basophils Absolute: 0 10*3/uL (ref 0.0–0.2)
Basos: 0 %
EOS (ABSOLUTE): 0.3 10*3/uL (ref 0.0–0.4)
EOS: 4 %
HEMATOCRIT: 42 % (ref 37.5–51.0)
HEMOGLOBIN: 14.3 g/dL (ref 13.0–17.7)
IMMATURE GRANS (ABS): 0 10*3/uL (ref 0.0–0.1)
Immature Granulocytes: 0 %
LYMPHS ABS: 1.3 10*3/uL (ref 0.7–3.1)
LYMPHS: 17 %
MCH: 30.6 pg (ref 26.6–33.0)
MCHC: 34 g/dL (ref 31.5–35.7)
MCV: 90 fL (ref 79–97)
MONOCYTES: 8 %
Monocytes Absolute: 0.6 10*3/uL (ref 0.1–0.9)
NEUTROS ABS: 5.3 10*3/uL (ref 1.4–7.0)
Neutrophils: 71 %
Platelets: 240 10*3/uL (ref 150–379)
RBC: 4.68 x10E6/uL (ref 4.14–5.80)
RDW: 12.8 % (ref 12.3–15.4)
WBC: 7.6 10*3/uL (ref 3.4–10.8)

## 2017-05-18 LAB — COMPREHENSIVE METABOLIC PANEL
ALT: 18 IU/L (ref 0–44)
AST: 19 IU/L (ref 0–40)
Albumin/Globulin Ratio: 2.3 — ABNORMAL HIGH (ref 1.2–2.2)
Albumin: 4.6 g/dL (ref 3.5–4.8)
Alkaline Phosphatase: 94 IU/L (ref 39–117)
BUN/Creatinine Ratio: 24 (ref 10–24)
BUN: 18 mg/dL (ref 8–27)
Bilirubin Total: 0.7 mg/dL (ref 0.0–1.2)
CALCIUM: 9.2 mg/dL (ref 8.6–10.2)
CHLORIDE: 104 mmol/L (ref 96–106)
CO2: 23 mmol/L (ref 20–29)
Creatinine, Ser: 0.75 mg/dL — ABNORMAL LOW (ref 0.76–1.27)
GFR, EST AFRICAN AMERICAN: 107 mL/min/{1.73_m2} (ref >59)
GFR, EST NON AFRICAN AMERICAN: 93 mL/min/{1.73_m2} (ref >59)
GLUCOSE: 85 mg/dL (ref 65–99)
Globulin, Total: 2 g/dL (ref 1.5–4.5)
Potassium: 4.2 mmol/L (ref 3.5–5.2)
Sodium: 144 mmol/L (ref 134–144)
TOTAL PROTEIN: 6.6 g/dL (ref 6.0–8.5)

## 2017-05-18 LAB — ALLERGEN, TOMATO F25

## 2017-05-18 LAB — ALPHA-GAL PANEL
Alpha Gal IgE*: <0.1 kU/L (ref <0.10)
BEEF CLASS INTERPRETATION: 0
Beef (Bos spp) IgE: <0.1 kU/L (ref <0.35)
Class Interpretation: 0
Lamb/Mutton (Ovis spp) IgE: <0.1 kU/L (ref <0.35)
PORK CLASS INTERPRETATION: 0
Pork (Sus spp) IgE: <0.1 kU/L (ref <0.35)

## 2017-05-18 LAB — CHRONIC URTICARIA: cu index: 1.3 (ref <10)

## 2017-05-18 LAB — TRYPTASE: Tryptase: 5 ug/L (ref 2.2–13.2)

## 2017-05-19 DIAGNOSIS — N401 Enlarged prostate with lower urinary tract symptoms: Secondary | ICD-10-CM | POA: Diagnosis not present

## 2017-05-19 DIAGNOSIS — N3941 Urge incontinence: Secondary | ICD-10-CM | POA: Diagnosis not present

## 2017-05-19 DIAGNOSIS — N3020 Other chronic cystitis without hematuria: Secondary | ICD-10-CM | POA: Diagnosis not present

## 2017-06-20 DIAGNOSIS — N3941 Urge incontinence: Secondary | ICD-10-CM | POA: Diagnosis not present

## 2017-06-20 DIAGNOSIS — N3020 Other chronic cystitis without hematuria: Secondary | ICD-10-CM | POA: Diagnosis not present

## 2017-06-20 DIAGNOSIS — N401 Enlarged prostate with lower urinary tract symptoms: Secondary | ICD-10-CM | POA: Diagnosis not present

## 2017-07-07 ENCOUNTER — Other Ambulatory Visit: Payer: Self-pay | Admitting: Cardiology

## 2017-07-07 DIAGNOSIS — I10 Essential (primary) hypertension: Secondary | ICD-10-CM

## 2017-07-18 ENCOUNTER — Other Ambulatory Visit: Payer: Self-pay | Admitting: Sports Medicine

## 2017-07-18 DIAGNOSIS — M21619 Bunion of unspecified foot: Secondary | ICD-10-CM

## 2017-07-18 DIAGNOSIS — M2040 Other hammer toe(s) (acquired), unspecified foot: Secondary | ICD-10-CM

## 2017-07-18 DIAGNOSIS — M2141 Flat foot [pes planus] (acquired), right foot: Secondary | ICD-10-CM

## 2017-07-18 DIAGNOSIS — M2142 Flat foot [pes planus] (acquired), left foot: Secondary | ICD-10-CM

## 2017-07-25 ENCOUNTER — Ambulatory Visit (INDEPENDENT_AMBULATORY_CARE_PROVIDER_SITE_OTHER): Payer: Medicare HMO | Admitting: Allergy

## 2017-07-25 ENCOUNTER — Encounter: Payer: Self-pay | Admitting: Allergy

## 2017-07-25 VITALS — BP 154/66 | HR 56 | Resp 16

## 2017-07-25 DIAGNOSIS — L508 Other urticaria: Secondary | ICD-10-CM | POA: Diagnosis not present

## 2017-07-25 DIAGNOSIS — T783XXD Angioneurotic edema, subsequent encounter: Secondary | ICD-10-CM

## 2017-07-25 NOTE — Progress Notes (Signed)
Follow-up Note  RE: JERIK FALLETTA MRN: [id number] DOB: 05-07-1946 Date of Office Visit: 07/25/2017   History of present illness: Jeremy Simon is a 72 y.o. male presenting today for follow-up of urticaria and angioedema.  He was seen in the office last on May 11, 2017 by myself for initial evaluation.  He came off of ramipril and I advised him to take the following regimen of Zyrtec and Zantac twice a day.  He states with stopping the medication and being on this antihistamine regimen he has not had any further episodes of hives or swelling.  He would like to come off of the antihistamines. He denies any major health changes since the last visit including no surgeries or hospitalizations.     Review of systems: Review of Systems  Constitutional: Negative for chills, fever and malaise/fatigue.  HENT: Negative for congestion, nosebleeds, sinus pain and sore throat.   Eyes: Negative for pain, discharge and redness.  Respiratory: Negative for cough, shortness of breath and wheezing.   Cardiovascular: Negative for chest pain.  Gastrointestinal: Negative for abdominal pain, constipation, diarrhea, heartburn, nausea and vomiting.  Musculoskeletal: Negative for joint pain.  Skin: Negative for itching and rash.  Neurological: Negative for headaches.    All other systems negative unless noted above in HPI  Past medical/social/surgical/family history have been reviewed and are unchanged unless specifically indicated below.  No changes  Medication List: Allergies as of 07/25/2017      Reactions   Altace [ramipril]    Hives, lip swelling      Medication List        Accurate as of 07/25/17  1:13 PM. Always use your most recent med list.          aspirin 325 MG tablet Take 325 mg by mouth daily.   atorvastatin 40 MG tablet Commonly known as:  LIPITOR Take 40 mg by mouth daily.   calcium carbonate 600 MG Tabs tablet Commonly known as:  OS-CAL Take 600 mg by mouth  daily with breakfast.   cetirizine 10 MG tablet Commonly known as:  ZYRTEC Take 1 tablet (10 mg total) by mouth 2 (two) times daily.   [employer] VIT D 5000 HIGH-POTENCY 5000 units capsule Generic drug:  Cholecalciferol Take 2,000 Units by mouth daily.   diphenhydrAMINE 25 mg capsule Commonly known as:  BENADRYL Take 25 mg by mouth every 4 (four) hours as needed.   fluticasone 50 MCG/ACT nasal spray Commonly known as:  FLONASE   hydrocortisone cream 1 % Apply 1 application topically as needed for itching.   hydroxypropyl methylcellulose / hypromellose 2.5 % ophthalmic solution Commonly known as:  ISOPTO TEARS / GONIOVISC Place 2 drops into both eyes at bedtime.   metoprolol succinate 25 MG 24 hr tablet Commonly known as:  TOPROL-XL TAKE 1 TABLET BY MOUTH EVERY DAY   ramipril 10 MG capsule Commonly known as:  ALTACE TAKE ONE CAPSULE DAILY   ranitidine 150 MG tablet Commonly known as:  ZANTAC Take 1 tablet (150 mg total) by mouth 2 (two) times daily.   sildenafil 20 MG tablet Commonly known as:  REVATIO TAKE 1-4 TABLETS BY MOUTH AS NEEDED       Known medication allergies: Allergies  Allergen Reactions  . Altace [Ramipril]     Hives, lip swelling     Physical examination: Blood pressure (!) 154/66, pulse (!) 56, resp. rate 16.  General: Alert, interactive, in no acute distress. HEENT: PERRLA, TMs pearly gray, turbinates non-edematous  without discharge, post-pharynx non erythematous. Neck: Supple without lymphadenopathy. Lungs: Clear to auscultation without wheezing, rhonchi or rales. {no increased work of breathing. CV: Normal S1, S2 without murmurs. Abdomen: Nondistended, nontender. Skin: Warm and dry, without lesions or rashes. Extremities:  No clubbing, cyanosis or edema. Neuro:   Grossly intact.  Diagnositics/Labs: Labs:  Component     Latest Ref Rng & Units 05/11/2017  Beef (Bos spp) IgE     <0.35 kU/L <0.10  Class Interpretation      0  Lamb/Mutton  (Ovis spp) IgE     <0.35 kU/L <0.10  Class Interpretation      0  Pork (Sus spp) IgE     <0.35 kU/L <0.10  Class Interpretation      0  Alpha Gal IgE*     <0.10 kU/L <0.10  Allergen Tomato, IgE     Class 0 kU/L <0.10   Component     Latest Ref Rng & Units 05/11/2017  cu index     <10 1.3   Component     Latest Ref Rng & Units 05/11/2017  D Pteronyssinus IgE     Class 0/I kU/L 0.11 (A)  D Farinae IgE     Class 0 kU/L <0.10  Cat Dander IgE     Class 0 kU/L <0.10  Dog Dander IgE     Class 0 kU/L <0.10  [place] Grass IgE     Class 0 kU/L <0.10  Timothy Grass IgE     Class 0 kU/L <0.10  [place] Grass IgE     Class 0 kU/L <0.10  Bahia Grass IgE     Class 0 kU/L <0.10  Cockroach, American IgE     Class 0 kU/L <0.10  Penicillium Chrysogen IgE     Class 0 kU/L <0.10  Cladosporium Herbarum IgE     Class 0 kU/L <0.10  Aspergillus Fumigatus IgE     Class 0 kU/L <0.10  Mucor Racemosus IgE     Class 0 kU/L <0.10  Alternaria Alternata IgE     Class 0 kU/L <0.10  Stemphylium Herbarum IgE     Class 0 kU/L <0.10  Common Silver Charletta Cousin IgE     Class 0 kU/L <0.10  Oak, White IgE     Class 0 kU/L <0.10  Elm, American IgE     Class 0 kU/L <0.10  Maple/Box Elder IgE     Class 0 kU/L <0.10  Hickory, White IgE     Class 0 kU/L <0.10  Amer Sycamore IgE Qn     Class 0 kU/L <0.10  White Mulberry IgE     Class 0 kU/L <0.10  Sweet gum IgE RAST Ql     Class 0 kU/L <0.10  Cedar, [place] IgE     Class 0 kU/L <0.10  Ragweed, Short IgE     Class 0 kU/L <0.10  Mugwort IgE Qn     Class 0 kU/L <0.10  Plantain, English IgE     Class 0 kU/L <0.10  Pigweed, Rough IgE     Class 0 kU/L <0.10  Sheep Sorrel IgE Qn     Class 0 kU/L <0.10  Nettle IgE     Class 0 kU/L <0.10   Component     Latest Ref Rng & Units 05/11/2017  Tryptase     2.2 - 13.2 ug/L 5.0   Component     Latest Ref Rng & Units 05/11/2017  WBC     3.4 - 10.8  x10E3/uL 7.6  RBC     4.14 - 5.80 x10E6/uL 4.68    Hemoglobin     13.0 - 17.7 g/dL 40.1  HCT     02.7 - 25.3 % 42.0  MCV     79 - 97 fL 90  MCH     26.6 - 33.0 pg 30.6  MCHC     31.5 - 35.7 g/dL 66.4  RDW     40.3 - 47.4 % 12.8  Platelets     150 - 379 x10E3/uL 240  Neutrophils     Not Estab. % 71  Lymphs     Not Estab. % 17  Monocytes     Not Estab. % 8  Eos     Not Estab. % 4  Basos     Not Estab. % 0  NEUT#     1.4 - 7.0 x10E3/uL 5.3  Lymphocyte #     0.7 - 3.1 x10E3/uL 1.3  Monocytes Absolute     0.1 - 0.9 x10E3/uL 0.6  EOS (ABSOLUTE)     0.0 - 0.4 x10E3/uL 0.3  Basophils Absolute     0.0 - 0.2 x10E3/uL 0.0  Immature Granulocytes     Not Estab. % 0  Immature Grans (Abs)     0.0 - 0.1 x10E3/uL 0.0  Glucose     65 - 99 mg/dL 85  BUN     8 - 27 mg/dL 18  Creatinine     2.59 - 1.27 mg/dL 5.63 (L)  GFR, Est Non African American     >59 mL/min/1.73 93  GFR, Est African American     >59 mL/min/1.73 107  BUN/Creatinine Ratio     10 - 24 24  Sodium     134 - 144 mmol/L 144  Potassium     3.5 - 5.2 mmol/L 4.2  Chloride     96 - 106 mmol/L 104  CO2     20 - 29 mmol/L 23  Calcium     8.6 - 10.2 mg/dL 9.2  Total Protein     6.0 - 8.5 g/dL 6.6  Albumin     3.5 - 4.8 g/dL 4.6  Globulin, Total     1.5 - 4.5 g/dL 2.0  Albumin/Globulin Ratio     1.2 - 2.2 2.3 (H)  Total Bilirubin     0.0 - 1.2 mg/dL 0.7  Alkaline Phosphatase     39 - 117 IU/L 94  AST     0 - 40 IU/L 19  ALT     0 - 44 IU/L 18    Assessment and plan: Urticaria with angioedema    - no further hives or swelling issues since last visit    - hives and swelling most likely triggered by Ramipril which you have stopped.  Would avoid medications in this family (ACE-inhibitors).     - your labwork was all reassuring    - since you have been doing well without hives or swelling will start weaning process as follows:          - stop evening dose of Zantac for 1 week          - then stop evening dose of Zyrtec for 1 week          - then  stop morning dose of Zantac for 1 week          - then stop morning dose of Zyrtec.             -  if hives or swelling return during the wean stop weaning and resume last medication taken away.         Follow-up pending wean.  If able to completely wean off medications follow-up as needed.  If hives or swelling return let us know  I appreciate the opportunity to take part in Derris's care. Please do not hesitate to contact me with questions.  Sincerely,   Margo AyeShaylar Kamile Fassler, MD Allergy/Immunology [facility]

## 2017-07-25 NOTE — Patient Instructions (Addendum)
Hives and swelling    - no further hives or swelling issues since last visit    - hives and swelling most likely triggered by Ramipril which you have stopped.  Would avoid medications in this family (ACE-inhibitors).     - your labwork was all reassuring    - since you have been doing well without hives or swelling will start weaning process as follows:          - stop evening dose of Zantac for 1 week          - then stop evening dose of Zyrtec for 1 week          - then stop morning dose of Zantac for 1 week          - then stop morning dose of Zyrtec.             -if hives or swelling return during the wean stop weaning and resume last medication taken away.         Follow-up pending wean.  If able to completely wean off medications follow-up as needed.  If hives or swelling return let us know

## 2017-07-26 DIAGNOSIS — R413 Other amnesia: Secondary | ICD-10-CM | POA: Diagnosis not present

## 2017-07-26 DIAGNOSIS — E7800 Pure hypercholesterolemia, unspecified: Secondary | ICD-10-CM | POA: Diagnosis not present

## 2017-07-26 DIAGNOSIS — N400 Enlarged prostate without lower urinary tract symptoms: Secondary | ICD-10-CM | POA: Diagnosis not present

## 2017-07-26 DIAGNOSIS — I10 Essential (primary) hypertension: Secondary | ICD-10-CM | POA: Diagnosis not present

## 2017-07-26 DIAGNOSIS — G609 Hereditary and idiopathic neuropathy, unspecified: Secondary | ICD-10-CM | POA: Diagnosis not present

## 2017-07-26 DIAGNOSIS — M79604 Pain in right leg: Secondary | ICD-10-CM | POA: Diagnosis not present

## 2017-07-26 DIAGNOSIS — Z6823 Body mass index (BMI) 23.0-23.9, adult: Secondary | ICD-10-CM | POA: Diagnosis not present

## 2017-08-29 DIAGNOSIS — R69 Illness, unspecified: Secondary | ICD-10-CM | POA: Diagnosis not present

## 2017-09-04 DIAGNOSIS — Z6823 Body mass index (BMI) 23.0-23.9, adult: Secondary | ICD-10-CM | POA: Diagnosis not present

## 2017-09-04 DIAGNOSIS — I10 Essential (primary) hypertension: Secondary | ICD-10-CM | POA: Diagnosis not present

## 2017-09-04 DIAGNOSIS — E7800 Pure hypercholesterolemia, unspecified: Secondary | ICD-10-CM | POA: Diagnosis not present

## 2017-09-07 ENCOUNTER — Encounter: Payer: Self-pay | Admitting: Sports Medicine

## 2017-09-07 ENCOUNTER — Ambulatory Visit: Payer: Medicare HMO | Admitting: Sports Medicine

## 2017-09-07 DIAGNOSIS — M2141 Flat foot [pes planus] (acquired), right foot: Secondary | ICD-10-CM

## 2017-09-07 DIAGNOSIS — M79671 Pain in right foot: Secondary | ICD-10-CM

## 2017-09-07 DIAGNOSIS — M79672 Pain in left foot: Secondary | ICD-10-CM

## 2017-09-07 DIAGNOSIS — M21619 Bunion of unspecified foot: Secondary | ICD-10-CM | POA: Diagnosis not present

## 2017-09-07 DIAGNOSIS — M2040 Other hammer toe(s) (acquired), unspecified foot: Secondary | ICD-10-CM | POA: Diagnosis not present

## 2017-09-07 DIAGNOSIS — M2142 Flat foot [pes planus] (acquired), left foot: Secondary | ICD-10-CM

## 2017-09-07 DIAGNOSIS — R29898 Other symptoms and signs involving the musculoskeletal system: Secondary | ICD-10-CM | POA: Diagnosis not present

## 2017-09-07 DIAGNOSIS — L909 Atrophic disorder of skin, unspecified: Secondary | ICD-10-CM

## 2017-09-07 NOTE — Progress Notes (Signed)
Subjective: Jeremy Simon is a 72 y.o. male patient who returns to office for evaluation of both feet. Patient states that he got new shoes and has been using his toe separators which have helped with the pain in his feet.  Patient also desires insoles to help support his arches and would like to  further discuss his options.  Patient denies any increase in pain to feet, any swelling, any bruising, or any other signs or symptoms at this time.  States that he has also started using compression socks when he exercises which helps with any fatigue or pain in his legs.  No other issues noted.   Patient Active Problem List   Diagnosis Date Noted  . Abnormal stress echocardiogram 05/21/2014  . CHEST PAIN 01/30/2009  . HYPERLIPIDEMIA 01/28/2009  . Essential hypertension 01/28/2009  . Coronary atherosclerosis 01/28/2009  . HIATAL HERNIA 01/28/2009   Current Outpatient Medications on File Prior to Visit  Medication Sig Dispense Refill  . aspirin 325 MG tablet Take 325 mg by mouth daily.      . calcium carbonate (TUMS - DOSED IN MG ELEMENTAL CALCIUM) 500 MG chewable tablet Chew 1 tablet by mouth daily.    Marland Kitchen docusate sodium (COLACE) 100 MG capsule Take 100 mg by mouth 2 (two) times daily.    . fluticasone (FLONASE) 50 MCG/ACT nasal spray     . hydrochlorothiazide (HYDRODIURIL) 12.5 MG tablet Take 12.5 mg by mouth daily.    . metoprolol succinate (TOPROL-XL) 25 MG 24 hr tablet TAKE 1 TABLET BY MOUTH EVERY DAY 90 tablet 1  . vitamin C (ASCORBIC ACID) 500 MG tablet Take 500 mg by mouth daily.     No current facility-administered medications on file prior to visit.    Allergies  Allergen Reactions  . Altace [Ramipril]     Hives, lip swelling     Objective:  General: Alert and oriented x3 in no acute distress  Dermatology: No open lesions bilateral lower extremities, no webspace macerations, no ecchymosis bilateral, all nails x 10 are well manicured. Minimal pinch callus at sides of big toe  joints bilateral.  Vascular: Dorsalis Pedis and Posterior Tibial pedal pulses 1/4, Capillary Fill Time 3 seconds, (+) pedal hair growth bilateral, no edema bilateral lower extremities, Temperature gradient within normal limits.  Neurology: Gross sensation intact via light touch bilateral.   Musculoskeletal: No tenderness with palpation along medial arch, medial fascial band,no tenderness along Posterior tibial tendon course with medial soft tissue buldge noted, there is decreased ankle rom with knee extending  vs flexed resembling gastroc equnius bilateral, Midtarsal joint limited. + Fat pad atrophy, bunion, and hammertoe deformity. + Medial arch collapse with forefoot abduction significant for pes planus bilateral.  Assessment and Plan: Problem List Items Addressed This Visit    None    Visit Diagnoses    Pes planus of both feet    -  Primary   Foot pain, bilateral       Hammer toe, unspecified laterality       Bunion       Fat pad atrophy of foot         -Complete examination performed -Discussed long-term care for pes planus, bunion, hammertoe, callus and fat pad atrophy secondary to foot type -Dispensed power step insoles to use as instructed and advised patient he should get a new pair once yearly -Recommend continue with toe separators -Recommend continue with good supportive shoes currently using [employer] -Recommend stretching before and after exercise  and continue with supportive socks -May also try over-the-counter topical pain creams and rubs as needed -Patient to return to office as needed or sooner if condition worsens.  Asencion Islam, DPM

## 2017-09-25 DIAGNOSIS — N3020 Other chronic cystitis without hematuria: Secondary | ICD-10-CM | POA: Diagnosis not present

## 2017-09-25 DIAGNOSIS — N401 Enlarged prostate with lower urinary tract symptoms: Secondary | ICD-10-CM | POA: Diagnosis not present

## 2017-09-25 DIAGNOSIS — N318 Other neuromuscular dysfunction of bladder: Secondary | ICD-10-CM | POA: Diagnosis not present

## 2017-10-11 DIAGNOSIS — I10 Essential (primary) hypertension: Secondary | ICD-10-CM | POA: Diagnosis not present

## 2017-10-20 ENCOUNTER — Other Ambulatory Visit: Payer: Self-pay | Admitting: Cardiology

## 2017-10-20 DIAGNOSIS — I10 Essential (primary) hypertension: Secondary | ICD-10-CM

## 2017-10-31 DIAGNOSIS — R69 Illness, unspecified: Secondary | ICD-10-CM | POA: Diagnosis not present

## 2017-11-14 DIAGNOSIS — M25571 Pain in right ankle and joints of right foot: Secondary | ICD-10-CM | POA: Diagnosis not present

## 2017-11-14 DIAGNOSIS — R2689 Other abnormalities of gait and mobility: Secondary | ICD-10-CM | POA: Diagnosis not present

## 2017-11-14 DIAGNOSIS — M25572 Pain in left ankle and joints of left foot: Secondary | ICD-10-CM | POA: Diagnosis not present

## 2017-11-14 DIAGNOSIS — M5489 Other dorsalgia: Secondary | ICD-10-CM | POA: Diagnosis not present

## 2017-11-15 DIAGNOSIS — G4700 Insomnia, unspecified: Secondary | ICD-10-CM | POA: Diagnosis not present

## 2017-11-15 DIAGNOSIS — R400 Somnolence: Secondary | ICD-10-CM | POA: Diagnosis not present

## 2017-11-23 DIAGNOSIS — M25571 Pain in right ankle and joints of right foot: Secondary | ICD-10-CM | POA: Diagnosis not present

## 2017-11-23 DIAGNOSIS — M25572 Pain in left ankle and joints of left foot: Secondary | ICD-10-CM | POA: Diagnosis not present

## 2017-11-23 DIAGNOSIS — M5489 Other dorsalgia: Secondary | ICD-10-CM | POA: Diagnosis not present

## 2017-11-23 DIAGNOSIS — R2689 Other abnormalities of gait and mobility: Secondary | ICD-10-CM | POA: Diagnosis not present

## 2017-12-11 DIAGNOSIS — M25571 Pain in right ankle and joints of right foot: Secondary | ICD-10-CM | POA: Diagnosis not present

## 2017-12-11 DIAGNOSIS — E7800 Pure hypercholesterolemia, unspecified: Secondary | ICD-10-CM | POA: Diagnosis not present

## 2017-12-11 DIAGNOSIS — M5489 Other dorsalgia: Secondary | ICD-10-CM | POA: Diagnosis not present

## 2017-12-11 DIAGNOSIS — R2689 Other abnormalities of gait and mobility: Secondary | ICD-10-CM | POA: Diagnosis not present

## 2017-12-11 DIAGNOSIS — M25572 Pain in left ankle and joints of left foot: Secondary | ICD-10-CM | POA: Diagnosis not present

## 2017-12-15 NOTE — Progress Notes (Signed)
HPI: FU coronary artery disease. He has had a previous PCI of his LAD in 2000. His most recent catheterization performed in August 2005 showed nonobstructive disease. Stress echocardiogram January 2016 showed electrographic changes, chest pain and ischemia in the LAD distribution. I discussed options with him at that time and he was felt to be asymptomatic. He elected medical therapy.  Nuclear study August 2018 showed ejection fraction 45%.  ECG positive.  However no ischemia or infarction.  Since I last saw him,  patient denies dyspnea, chest pain, palpitations or syncope.  Current Outpatient Medications  Medication Sig Dispense Refill  . aspirin 325 MG tablet Take 325 mg by mouth daily.      Marland Kitchen atorvastatin (LIPITOR) 40 MG tablet Take 40 mg by mouth daily.    . calcium carbonate (TUMS - DOSED IN MG ELEMENTAL CALCIUM) 500 MG chewable tablet Chew 1 tablet by mouth daily.    . Calcium-Magnesium-Vitamin D (CALCIUM 500 PO) Take 1 tablet by mouth daily.    . fluticasone (FLONASE) 50 MCG/ACT nasal spray     . hydrochlorothiazide (HYDRODIURIL) 12.5 MG tablet Take 25 mg by mouth daily.     . metoprolol succinate (TOPROL-XL) 25 MG 24 hr tablet TAKE 1 TABLET BY MOUTH EVERY DAY 90 tablet 1  . traZODone (DESYREL) 50 MG tablet Take 50 mg by mouth at bedtime.     No current facility-administered medications for this visit.      Past Medical History:  Diagnosis Date  . CAD   . HIATAL HERNIA   . HYPERLIPIDEMIA   . HYPERTENSION     Past Surgical History:  Procedure Laterality Date  . CARDIAC CATHETERIZATION    . NOSE SURGERY    . TRANSURETHRAL RESECTION OF PROSTATE  03/2017    Social History   Socioeconomic History  . Marital status: Married    Spouse name: Not on file  . Number of children: Not on file  . Years of education: Not on file  . Highest education level: Not on file  Occupational History  . Not on file  Social Needs  . Financial resource strain: Not on file  . Food  insecurity:    Worry: Not on file    Inability: Not on file  . Transportation needs:    Medical: Not on file    Non-medical: Not on file  Tobacco Use  . Smoking status: Never Smoker  . Smokeless tobacco: Never Used  Substance and Sexual Activity  . Alcohol use: No    Alcohol/week: 0.0 standard drinks    Frequency: Never  . Drug use: No  . Sexual activity: Not on file  Lifestyle  . Physical activity:    Days per week: Not on file    Minutes per session: Not on file  . Stress: Not on file  Relationships  . Social connections:    Talks on phone: Not on file    Gets together: Not on file    Attends religious service: Not on file    Active member of club or organization: Not on file    Attends meetings of clubs or organizations: Not on file    Relationship status: Not on file  . Intimate partner violence:    Fear of current or ex partner: Not on file    Emotionally abused: Not on file    Physically abused: Not on file    Forced sexual activity: Not on file  Other Topics Concern  . Not  on file  Social History Narrative  . Not on file    Family History  Problem Relation Age of Onset  . Alzheimer's disease Mother        SOME TYPE OF VALUE DISORDER  . Heart attack Father   . Hypertension Father   . Obesity Father   . Hypertension Sister   . Diabetes Sister   . Obesity Sister   . Other Son        NO HEALTH PROBLEMS  . Other Daughter        NO HEALTH PROBLEMS    ROS: Some leg myalgias but no fevers or chills, productive cough, hemoptysis, dysphasia, odynophagia, melena, hematochezia, dysuria, hematuria, rash, seizure activity, orthopnea, PND, pedal edema, claudication. Remaining systems are negative.  Physical Exam: Well-developed well-nourished in no acute distress.  Skin is warm and dry.  HEENT is normal.  Neck is supple.  Chest is clear to auscultation with normal expansion.  Cardiovascular exam is regular rate and rhythm.  Abdominal exam nontender or  distended. No masses palpated. Extremities show no edema. neuro grossly intact  ECG-sinus bradycardia at a rate of 59.  Personally reviewed  A/P  1 coronary artery disease-patient remains asymptomatic.  Most recent nuclear study showed no ischemia.  Continue medical therapy with aspirin and statin.  Decrease aspirin to 81 mg daily.  2 hypertension-patient's blood pressure is elevated today.  Add amlodipine 5 mg daily and follow.  3 hyperlipidemia-continue statin.  Lipids and liver monitored by primary care.  Olga MillersBrian Katalyna Socarras, MD

## 2017-12-19 ENCOUNTER — Ambulatory Visit: Payer: Medicare HMO | Admitting: Cardiology

## 2017-12-19 ENCOUNTER — Encounter: Payer: Self-pay | Admitting: Cardiology

## 2017-12-19 VITALS — BP 150/62 | HR 59 | Ht 73.0 in | Wt 176.2 lb

## 2017-12-19 DIAGNOSIS — I2510 Atherosclerotic heart disease of native coronary artery without angina pectoris: Secondary | ICD-10-CM

## 2017-12-19 DIAGNOSIS — I10 Essential (primary) hypertension: Secondary | ICD-10-CM

## 2017-12-19 DIAGNOSIS — E7800 Pure hypercholesterolemia, unspecified: Secondary | ICD-10-CM | POA: Diagnosis not present

## 2017-12-19 MED ORDER — AMLODIPINE BESYLATE 5 MG PO TABS: 5.0000 mg | ORAL_TABLET | Freq: Every day | ORAL | 3 refills | Status: DC

## 2017-12-19 MED ORDER — ASPIRIN 81 MG PO TABS: 325.0000 mg | ORAL_TABLET | Freq: Every day | ORAL | Status: DC

## 2017-12-19 NOTE — Patient Instructions (Signed)
Medication Instructions:   DECREASE ASPIRIN TO 81 MG ONCE DAILY  START AMLODIPINE 5 MG ONCE DAILY  Follow-Up:  Your physician wants you to follow-up in: ONE YEAR WITH DR Shelda PalRENSHAW You will receive a reminder letter in the mail two months in advance. If you don't receive a letter, please call our office to schedule the follow-up appointment.   If you need a refill on your cardiac medications before your next appointment, please call your pharmacy.

## 2017-12-20 DIAGNOSIS — R0681 Apnea, not elsewhere classified: Secondary | ICD-10-CM | POA: Diagnosis not present

## 2017-12-22 DIAGNOSIS — R0681 Apnea, not elsewhere classified: Secondary | ICD-10-CM | POA: Diagnosis not present

## 2017-12-22 MED ORDER — ASPIRIN EC 81 MG PO TBEC: 81.0000 mg | DELAYED_RELEASE_TABLET | Freq: Every day | ORAL | Status: DC

## 2018-01-15 DIAGNOSIS — M25572 Pain in left ankle and joints of left foot: Secondary | ICD-10-CM | POA: Diagnosis not present

## 2018-01-15 DIAGNOSIS — M5489 Other dorsalgia: Secondary | ICD-10-CM | POA: Diagnosis not present

## 2018-01-15 DIAGNOSIS — R2689 Other abnormalities of gait and mobility: Secondary | ICD-10-CM | POA: Diagnosis not present

## 2018-01-15 DIAGNOSIS — M25571 Pain in right ankle and joints of right foot: Secondary | ICD-10-CM | POA: Diagnosis not present

## 2018-01-23 DIAGNOSIS — R69 Illness, unspecified: Secondary | ICD-10-CM | POA: Diagnosis not present

## 2018-02-26 DIAGNOSIS — M25571 Pain in right ankle and joints of right foot: Secondary | ICD-10-CM | POA: Diagnosis not present

## 2018-02-26 DIAGNOSIS — M5489 Other dorsalgia: Secondary | ICD-10-CM | POA: Diagnosis not present

## 2018-02-26 DIAGNOSIS — M25572 Pain in left ankle and joints of left foot: Secondary | ICD-10-CM | POA: Diagnosis not present

## 2018-02-26 DIAGNOSIS — R2689 Other abnormalities of gait and mobility: Secondary | ICD-10-CM | POA: Diagnosis not present

## 2018-03-01 DIAGNOSIS — R69 Illness, unspecified: Secondary | ICD-10-CM | POA: Diagnosis not present

## 2018-03-27 DIAGNOSIS — I10 Essential (primary) hypertension: Secondary | ICD-10-CM | POA: Diagnosis not present

## 2018-03-30 DIAGNOSIS — R69 Illness, unspecified: Secondary | ICD-10-CM | POA: Diagnosis not present

## 2018-04-09 DIAGNOSIS — M25572 Pain in left ankle and joints of left foot: Secondary | ICD-10-CM | POA: Diagnosis not present

## 2018-04-09 DIAGNOSIS — R2689 Other abnormalities of gait and mobility: Secondary | ICD-10-CM | POA: Diagnosis not present

## 2018-04-09 DIAGNOSIS — M25571 Pain in right ankle and joints of right foot: Secondary | ICD-10-CM | POA: Diagnosis not present

## 2018-04-10 DIAGNOSIS — N401 Enlarged prostate with lower urinary tract symptoms: Secondary | ICD-10-CM | POA: Diagnosis not present

## 2018-04-10 DIAGNOSIS — R69 Illness, unspecified: Secondary | ICD-10-CM | POA: Diagnosis not present

## 2018-04-10 DIAGNOSIS — N3020 Other chronic cystitis without hematuria: Secondary | ICD-10-CM | POA: Diagnosis not present

## 2018-04-10 DIAGNOSIS — R351 Nocturia: Secondary | ICD-10-CM | POA: Diagnosis not present

## 2018-04-10 DIAGNOSIS — N400 Enlarged prostate without lower urinary tract symptoms: Secondary | ICD-10-CM | POA: Diagnosis not present

## 2018-04-10 DIAGNOSIS — N318 Other neuromuscular dysfunction of bladder: Secondary | ICD-10-CM | POA: Diagnosis not present

## 2018-04-24 DIAGNOSIS — M546 Pain in thoracic spine: Secondary | ICD-10-CM | POA: Diagnosis not present

## 2018-04-24 DIAGNOSIS — M542 Cervicalgia: Secondary | ICD-10-CM | POA: Diagnosis not present

## 2018-04-24 DIAGNOSIS — M9902 Segmental and somatic dysfunction of thoracic region: Secondary | ICD-10-CM | POA: Diagnosis not present

## 2018-04-24 DIAGNOSIS — M9901 Segmental and somatic dysfunction of cervical region: Secondary | ICD-10-CM | POA: Diagnosis not present

## 2018-04-24 DIAGNOSIS — M9903 Segmental and somatic dysfunction of lumbar region: Secondary | ICD-10-CM | POA: Diagnosis not present

## 2018-05-10 DIAGNOSIS — G4700 Insomnia, unspecified: Secondary | ICD-10-CM | POA: Diagnosis not present

## 2018-05-30 DIAGNOSIS — H26493 Other secondary cataract, bilateral: Secondary | ICD-10-CM | POA: Diagnosis not present

## 2018-06-05 DIAGNOSIS — R69 Illness, unspecified: Secondary | ICD-10-CM | POA: Diagnosis not present

## 2018-06-07 DIAGNOSIS — M9902 Segmental and somatic dysfunction of thoracic region: Secondary | ICD-10-CM | POA: Diagnosis not present

## 2018-06-07 DIAGNOSIS — M546 Pain in thoracic spine: Secondary | ICD-10-CM | POA: Diagnosis not present

## 2018-06-07 DIAGNOSIS — M9901 Segmental and somatic dysfunction of cervical region: Secondary | ICD-10-CM | POA: Diagnosis not present

## 2018-06-07 DIAGNOSIS — M9903 Segmental and somatic dysfunction of lumbar region: Secondary | ICD-10-CM | POA: Diagnosis not present

## 2018-06-07 DIAGNOSIS — M542 Cervicalgia: Secondary | ICD-10-CM | POA: Diagnosis not present

## 2018-06-25 ENCOUNTER — Other Ambulatory Visit: Payer: Self-pay

## 2018-06-25 DIAGNOSIS — I10 Essential (primary) hypertension: Secondary | ICD-10-CM

## 2018-06-25 MED ORDER — METOPROLOL SUCCINATE ER 25 MG PO TB24: 25.0000 mg | ORAL_TABLET | Freq: Every day | ORAL | 1 refills | Status: DC

## 2018-06-25 NOTE — Telephone Encounter (Signed)
Rx(s) sent to pharmacy electronically.  

## 2018-06-28 DIAGNOSIS — Z1389 Encounter for screening for other disorder: Secondary | ICD-10-CM | POA: Diagnosis not present

## 2018-06-28 DIAGNOSIS — M9901 Segmental and somatic dysfunction of cervical region: Secondary | ICD-10-CM | POA: Diagnosis not present

## 2018-06-28 DIAGNOSIS — M9903 Segmental and somatic dysfunction of lumbar region: Secondary | ICD-10-CM | POA: Diagnosis not present

## 2018-06-28 DIAGNOSIS — Z0000 Encounter for general adult medical examination without abnormal findings: Secondary | ICD-10-CM | POA: Diagnosis not present

## 2018-06-28 DIAGNOSIS — M9902 Segmental and somatic dysfunction of thoracic region: Secondary | ICD-10-CM | POA: Diagnosis not present

## 2018-06-28 DIAGNOSIS — I10 Essential (primary) hypertension: Secondary | ICD-10-CM | POA: Diagnosis not present

## 2018-06-28 DIAGNOSIS — E7800 Pure hypercholesterolemia, unspecified: Secondary | ICD-10-CM | POA: Diagnosis not present

## 2018-06-28 DIAGNOSIS — I2510 Atherosclerotic heart disease of native coronary artery without angina pectoris: Secondary | ICD-10-CM | POA: Diagnosis not present

## 2018-06-28 DIAGNOSIS — N400 Enlarged prostate without lower urinary tract symptoms: Secondary | ICD-10-CM | POA: Diagnosis not present

## 2018-06-28 DIAGNOSIS — M546 Pain in thoracic spine: Secondary | ICD-10-CM | POA: Diagnosis not present

## 2018-06-28 DIAGNOSIS — M542 Cervicalgia: Secondary | ICD-10-CM | POA: Diagnosis not present

## 2018-06-28 DIAGNOSIS — Z1331 Encounter for screening for depression: Secondary | ICD-10-CM | POA: Diagnosis not present

## 2018-07-03 DIAGNOSIS — M79672 Pain in left foot: Secondary | ICD-10-CM | POA: Diagnosis not present

## 2018-07-03 DIAGNOSIS — M79671 Pain in right foot: Secondary | ICD-10-CM | POA: Diagnosis not present

## 2018-07-05 DIAGNOSIS — G609 Hereditary and idiopathic neuropathy, unspecified: Secondary | ICD-10-CM | POA: Diagnosis not present

## 2018-07-11 DIAGNOSIS — R69 Illness, unspecified: Secondary | ICD-10-CM | POA: Diagnosis not present

## 2018-07-17 DIAGNOSIS — G629 Polyneuropathy, unspecified: Secondary | ICD-10-CM | POA: Diagnosis not present

## 2018-07-19 DIAGNOSIS — M546 Pain in thoracic spine: Secondary | ICD-10-CM | POA: Diagnosis not present

## 2018-07-19 DIAGNOSIS — M9903 Segmental and somatic dysfunction of lumbar region: Secondary | ICD-10-CM | POA: Diagnosis not present

## 2018-07-19 DIAGNOSIS — M542 Cervicalgia: Secondary | ICD-10-CM | POA: Diagnosis not present

## 2018-07-19 DIAGNOSIS — M9902 Segmental and somatic dysfunction of thoracic region: Secondary | ICD-10-CM | POA: Diagnosis not present

## 2018-07-19 DIAGNOSIS — M9901 Segmental and somatic dysfunction of cervical region: Secondary | ICD-10-CM | POA: Diagnosis not present

## 2018-07-24 DIAGNOSIS — H26493 Other secondary cataract, bilateral: Secondary | ICD-10-CM | POA: Diagnosis not present

## 2018-08-16 DIAGNOSIS — M9902 Segmental and somatic dysfunction of thoracic region: Secondary | ICD-10-CM | POA: Diagnosis not present

## 2018-08-16 DIAGNOSIS — M546 Pain in thoracic spine: Secondary | ICD-10-CM | POA: Diagnosis not present

## 2018-08-16 DIAGNOSIS — M9901 Segmental and somatic dysfunction of cervical region: Secondary | ICD-10-CM | POA: Diagnosis not present

## 2018-08-16 DIAGNOSIS — M9903 Segmental and somatic dysfunction of lumbar region: Secondary | ICD-10-CM | POA: Diagnosis not present

## 2018-08-16 DIAGNOSIS — M542 Cervicalgia: Secondary | ICD-10-CM | POA: Diagnosis not present

## 2018-08-28 DIAGNOSIS — K529 Noninfective gastroenteritis and colitis, unspecified: Secondary | ICD-10-CM | POA: Diagnosis not present

## 2018-09-13 DIAGNOSIS — M542 Cervicalgia: Secondary | ICD-10-CM | POA: Diagnosis not present

## 2018-09-13 DIAGNOSIS — M9901 Segmental and somatic dysfunction of cervical region: Secondary | ICD-10-CM | POA: Diagnosis not present

## 2018-09-13 DIAGNOSIS — M546 Pain in thoracic spine: Secondary | ICD-10-CM | POA: Diagnosis not present

## 2018-09-13 DIAGNOSIS — M9903 Segmental and somatic dysfunction of lumbar region: Secondary | ICD-10-CM | POA: Diagnosis not present

## 2018-09-13 DIAGNOSIS — M9902 Segmental and somatic dysfunction of thoracic region: Secondary | ICD-10-CM | POA: Diagnosis not present

## 2018-09-25 ENCOUNTER — Other Ambulatory Visit: Payer: Self-pay | Admitting: Cardiology

## 2018-09-25 DIAGNOSIS — R69 Illness, unspecified: Secondary | ICD-10-CM | POA: Diagnosis not present

## 2018-09-25 DIAGNOSIS — I10 Essential (primary) hypertension: Secondary | ICD-10-CM

## 2018-10-03 DIAGNOSIS — G629 Polyneuropathy, unspecified: Secondary | ICD-10-CM | POA: Diagnosis not present

## 2018-10-11 DIAGNOSIS — M542 Cervicalgia: Secondary | ICD-10-CM | POA: Diagnosis not present

## 2018-10-11 DIAGNOSIS — M9903 Segmental and somatic dysfunction of lumbar region: Secondary | ICD-10-CM | POA: Diagnosis not present

## 2018-10-11 DIAGNOSIS — M546 Pain in thoracic spine: Secondary | ICD-10-CM | POA: Diagnosis not present

## 2018-10-11 DIAGNOSIS — M9902 Segmental and somatic dysfunction of thoracic region: Secondary | ICD-10-CM | POA: Diagnosis not present

## 2018-10-11 DIAGNOSIS — M9901 Segmental and somatic dysfunction of cervical region: Secondary | ICD-10-CM | POA: Diagnosis not present

## 2018-10-31 DIAGNOSIS — G629 Polyneuropathy, unspecified: Secondary | ICD-10-CM | POA: Diagnosis not present

## 2018-10-31 DIAGNOSIS — I10 Essential (primary) hypertension: Secondary | ICD-10-CM | POA: Diagnosis not present

## 2018-11-01 ENCOUNTER — Other Ambulatory Visit: Payer: Self-pay | Admitting: Cardiology

## 2018-11-13 DIAGNOSIS — M9902 Segmental and somatic dysfunction of thoracic region: Secondary | ICD-10-CM | POA: Diagnosis not present

## 2018-11-13 DIAGNOSIS — M546 Pain in thoracic spine: Secondary | ICD-10-CM | POA: Diagnosis not present

## 2018-11-13 DIAGNOSIS — M9903 Segmental and somatic dysfunction of lumbar region: Secondary | ICD-10-CM | POA: Diagnosis not present

## 2018-11-13 DIAGNOSIS — M9901 Segmental and somatic dysfunction of cervical region: Secondary | ICD-10-CM | POA: Diagnosis not present

## 2018-11-13 DIAGNOSIS — M542 Cervicalgia: Secondary | ICD-10-CM | POA: Diagnosis not present

## 2018-12-04 ENCOUNTER — Other Ambulatory Visit: Payer: Self-pay

## 2018-12-04 ENCOUNTER — Ambulatory Visit: Payer: Medicare HMO | Admitting: Neurology

## 2018-12-04 ENCOUNTER — Telehealth: Payer: Self-pay | Admitting: Neurology

## 2018-12-04 ENCOUNTER — Encounter: Payer: Self-pay | Admitting: Neurology

## 2018-12-04 DIAGNOSIS — R799 Abnormal finding of blood chemistry, unspecified: Secondary | ICD-10-CM | POA: Diagnosis not present

## 2018-12-04 DIAGNOSIS — M545 Low back pain, unspecified: Secondary | ICD-10-CM

## 2018-12-04 DIAGNOSIS — E538 Deficiency of other specified B group vitamins: Secondary | ICD-10-CM | POA: Diagnosis not present

## 2018-12-04 DIAGNOSIS — E559 Vitamin D deficiency, unspecified: Secondary | ICD-10-CM | POA: Diagnosis not present

## 2018-12-04 DIAGNOSIS — G2581 Restless legs syndrome: Secondary | ICD-10-CM | POA: Diagnosis not present

## 2018-12-04 DIAGNOSIS — R202 Paresthesia of skin: Secondary | ICD-10-CM | POA: Diagnosis not present

## 2018-12-04 MED ORDER — ROPINIROLE HCL 1 MG PO TABS: 2.0000 mg | ORAL_TABLET | Freq: Every day | ORAL | 6 refills | Status: DC

## 2018-12-04 NOTE — Telephone Encounter (Signed)
Request made waiting on the report to be faxed

## 2018-12-04 NOTE — Telephone Encounter (Signed)
Get [facility] record from  [place]. S[place], MD Orthopedic surgeon in [place], [facility]   Address: [address], [place], [place] [address] Phone: [phone]

## 2018-12-04 NOTE — Progress Notes (Addendum)
PATIENT: Jeremy Simon DOB: [birth year]  Chief Complaint  Patient presents with  . New Patient (Initial Visit)    EMG room 4. Paper referral from Dr. Leonia ReaderVan Eyk for polyneuropathy. Has tried gabapentin.  Marland Kitchen. PCP    Crist FatJason Van Eyk, MD     HISTORICAL  Jeremy LarsenDennis M Whiteman is a 73 years old male, seen in request by his primary care physician Dr. Leonia ReaderVan Eyk, Barbara CowerJason for evaluation of neuropathy, initial evaluation was on December 04, 2018.  I have reviewed and summarized the referring note from the referring physician.  He had past medical history of hypertension, hyperlipidemia, coronary artery disease, status post stent  Since 2019, he noticed frequent bilateral calf muscle deep achy pain, especially at nighttime, difficulty falling to sleep, massage, pacing around did not help, he also complains of bilateral feet, toes tingling, he denies burning pain, denies gait abnormality, mild low back pain, denies significant radiating pain, denies bowel bladder incontinence, he does complains of frequent shoulder achy pain, denies upper extremity paresthesia or weakness, he also has occasionally bilateral posterior thigh muscle cramping  [facility] Dr.Sisco, reported EMG nerve conduction study showed evidence of peripheral neuropathy, he was given prescription of gabapentin 300 mg every night without significant improvement,  Laboratory evaluation in February 2020 showed normal CBC, CMP with exception of mild elevated glucose 116, normal TSH, LDL was 53  REVIEW OF SYSTEMS: Full 14 system review of systems performed and notable only for as above All other review of systems were negative.  ALLERGIES: Allergies  Allergen Reactions  . Altace [Ramipril]     Hives, lip swelling    HOME MEDICATIONS: Current Outpatient Medications  Medication Sig Dispense Refill  . amLODipine (NORVASC) 5 MG tablet TAKE 1 TABLET BY MOUTH EVERY DAY 90 tablet 0  . aspirin EC 81 MG tablet Take 1  tablet (81 mg total) by mouth daily.    Marland Kitchen. atorvastatin (LIPITOR) 40 MG tablet Take 40 mg by mouth daily.    . Calcium-Magnesium-Vitamin D (CALCIUM 500 PO) Take 1 tablet by mouth daily.    . fluticasone (FLONASE) 50 MCG/ACT nasal spray     . hydrochlorothiazide (HYDRODIURIL) 25 MG tablet Take 25 mg by mouth daily.    . metoprolol succinate (TOPROL-XL) 25 MG 24 hr tablet TAKE 1 TABLET BY MOUTH EVERY DAY 90 tablet 0  . traZODone (DESYREL) 50 MG tablet Take 50 mg by mouth at bedtime.     No current facility-administered medications for this visit.     PAST MEDICAL HISTORY: Past Medical History:  Diagnosis Date  . CAD   . HIATAL HERNIA   . HYPERLIPIDEMIA   . HYPERTENSION     PAST SURGICAL HISTORY: Past Surgical History:  Procedure Laterality Date  . CARDIAC CATHETERIZATION    . NOSE SURGERY    . TRANSURETHRAL RESECTION OF PROSTATE  03/2017    FAMILY HISTORY: Family History  Problem Relation Age of Onset  . Alzheimer's disease Mother        SOME TYPE OF VALUE DISORDER  . Heart attack Father   . Hypertension Father   . Obesity Father   . Hypertension Sister   . Diabetes Sister   . Obesity Sister   . Other Son        NO HEALTH PROBLEMS  . Other Daughter        NO HEALTH PROBLEMS    SOCIAL HISTORY: Social History   Socioeconomic History  . Marital  status: Married    Spouse name: Not on file  . Number of children: Not on file  . Years of education: Not on file  . Highest education level: Not on file  Occupational History  . Not on file  Social Needs  . Financial resource strain: Not on file  . Food insecurity    Worry: Not on file    Inability: Not on file  . Transportation needs    Medical: Not on file    Non-medical: Not on file  Tobacco Use  . Smoking status: Never Smoker  . Smokeless tobacco: Never Used  Substance and Sexual Activity  . Alcohol use: No    Alcohol/week: 0.0 standard drinks    Frequency: Never  . Drug use: No  . Sexual activity: Not on  file  Lifestyle  . Physical activity    Days per week: Not on file    Minutes per session: Not on file  . Stress: Not on file  Relationships  . Social Herbalist on phone: Not on file    Gets together: Not on file    Attends religious service: Not on file    Active member of club or organization: Not on file    Attends meetings of clubs or organizations: Not on file    Relationship status: Not on file  . Intimate partner violence    Fear of current or ex partner: Not on file    Emotionally abused: Not on file    Physically abused: Not on file    Forced sexual activity: Not on file  Other Topics Concern  . Not on file  Social History Narrative   Lives   Caffeine use:      PHYSICAL EXAM   There were no vitals filed for this visit.  Not recorded      There is no height or weight on file to calculate BMI.  PHYSICAL EXAMNIATION:  Gen: NAD, conversant, well nourised, obese, well groomed                     Cardiovascular: Regular rate rhythm, no peripheral edema, warm, nontender. Eyes: Conjunctivae clear without exudates or hemorrhage Neck: Supple, no carotid bruits. Pulmonary: Clear to auscultation bilaterally   NEUROLOGICAL EXAM:  MENTAL STATUS: Speech:    Speech is normal; fluent and spontaneous with normal comprehension.  Cognition:     Orientation to time, place and person     Normal recent and remote memory     Normal Attention span and concentration     Normal Language, naming, repeating,spontaneous speech     Fund of knowledge   CRANIAL NERVES: CN II: Visual fields are full to confrontation.  Pupils are round equal and briskly reactive to light. CN III, IV, VI: extraocular movement are normal. No ptosis. CN V: Facial sensation is intact to pinprick in all 3 divisions bilaterally. Corneal responses are intact.  CN VII: Face is symmetric with normal eye closure and smile. CN VIII: Hearing is normal to rubbing fingers CN IX, X: Palate elevates  symmetrically. Phonation is normal. CN XI: Head turning and shoulder shrug are intact CN XII: Tongue is midline with normal movements and no atrophy.  MOTOR: Normal muscle tone, bulk, and strength  REFLEXES: Reflexes are 1 and symmetric at the biceps, triceps, knees, and absent at ankles. Plantar responses are flexor.  SENSORY: Intact to light touch, pinprick, positional sensation and vibratory sensation are intact in fingers and toes.  COORDINATION: Rapid alternating movements and fine finger movements are intact. There is no dysmetria on finger-to-nose and heel-knee-shin.    GAIT/STANCE: He has flatfoot, steady gait, mild difficulty perform tiptoe and heel walking  DIAGNOSTIC DATA (LABS, IMAGING, TESTING) - I reviewed patient records, labs, notes, testing and imaging myself where available.   ASSESSMENT AND PLAN  Jeremy LarsenDennis M Mcgillivray is a 73 y.o. male   Bilateral feet paresthesia, bilateral calf muscle deep achy pain, frequent thigh muscle cramping Chronic low back pain Restless leg syndrome  MRI of lumbar spine to rule out bilateral lumbosacral radiculopathy  Get EMG nerve conduction study record from  [place]ance T. Sisco, MD  [place]   Address: [address], [place], [place] [address]  Phone: [phone]  Add on Requip 1mg  titrating to 2 tablets every night  Laboratory evaluations  Levert FeinsteinYijun Shubh Chiara, M.D. Ph.D.  [facility] [address], [address] [place], [place] [address] Ph: [phone] Fax: [phone]  CC: Crist FatVan Eyk, Jason, MD  Addendum: EMG nerve conduction study [facility] on July 20, 2018 by Elpidio Galeaandall Stewart, DPT, ECS, showed diminished amplitude in bilateral sural, left superficial peroneal sensory study.  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

## 2018-12-04 NOTE — Telephone Encounter (Signed)
[employer] medicare order sent to [facility]. They will reach out to the patient to schedule.

## 2018-12-07 LAB — PROTEIN ELECTROPHORESIS, SERUM
A/G Ratio: 1.6 (ref 0.7–1.7)
Albumin ELP: 4.2 g/dL (ref 2.9–4.4)
Alpha 1: 0.3 g/dL (ref 0.0–0.4)
Alpha 2: 0.7 g/dL (ref 0.4–1.0)
Beta: 0.8 g/dL (ref 0.7–1.3)
Gamma Globulin: 0.7 g/dL (ref 0.4–1.8)
Globulin, Total: 2.6 g/dL (ref 2.2–3.9)
Total Protein: 6.8 g/dL (ref 6.0–8.5)

## 2018-12-07 LAB — HEMOGLOBIN A1C
Est. average glucose Bld gHb Est-mCnc: 108 mg/dL
Hgb A1c MFr Bld: 5.4 % (ref 4.8–5.6)

## 2018-12-07 LAB — VITAMIN D 25 HYDROXY (VIT D DEFICIENCY, FRACTURES): Vit D, 25-Hydroxy: 50.1 ng/mL (ref 30.0–100.0)

## 2018-12-07 LAB — VITAMIN B12: Vitamin B-12: 390 pg/mL (ref 232–1245)

## 2018-12-07 LAB — FERRITIN: Ferritin: 70 ng/mL (ref 30–400)

## 2018-12-07 LAB — ANA W/REFLEX: Anti Nuclear Antibody (ANA): NEGATIVE

## 2018-12-07 LAB — SEDIMENTATION RATE: Sed Rate: 2 mm/hr (ref 0–30)

## 2018-12-07 LAB — C-REACTIVE PROTEIN: CRP: <1 mg/L (ref 0–10)

## 2018-12-07 LAB — RPR: RPR Ser Ql: NONREACTIVE

## 2018-12-11 ENCOUNTER — Telehealth: Payer: Self-pay | Admitting: Neurology

## 2018-12-11 NOTE — Telephone Encounter (Signed)
Please get his EMG report   I have the contact info for where Dr. Joie Bimler on March 11 sent me to for  Nerve Conduction Test. Both lower limbs.  If Dr. Joie Bimler  has not responded to you about the test results, do you need me to provide any of the contact info I found? Dr. Joie Bimler referred me to [facility] across the street from his office in [place] after he took x-rays of both feet. His office told me they were "mailing" the report from Dr. Ivan Croft office to Dr. Nona Dell who also referred me to you.           Jeremy Simon     DOB  06/28/1945

## 2018-12-12 ENCOUNTER — Encounter: Payer: Self-pay | Admitting: *Deleted

## 2018-12-12 NOTE — Telephone Encounter (Signed)
Hilda Blades is working on getting these records for [place].

## 2018-12-12 NOTE — Telephone Encounter (Signed)
Records received, I will place on Dr Rhea Belton desk

## 2018-12-18 DIAGNOSIS — M9902 Segmental and somatic dysfunction of thoracic region: Secondary | ICD-10-CM | POA: Diagnosis not present

## 2018-12-18 DIAGNOSIS — M546 Pain in thoracic spine: Secondary | ICD-10-CM | POA: Diagnosis not present

## 2018-12-18 DIAGNOSIS — M9901 Segmental and somatic dysfunction of cervical region: Secondary | ICD-10-CM | POA: Diagnosis not present

## 2018-12-18 DIAGNOSIS — M542 Cervicalgia: Secondary | ICD-10-CM | POA: Diagnosis not present

## 2018-12-18 DIAGNOSIS — M9903 Segmental and somatic dysfunction of lumbar region: Secondary | ICD-10-CM | POA: Diagnosis not present

## 2018-12-19 NOTE — Progress Notes (Signed)
Virtual Visit via Video Note   This visit type was conducted due to national recommendations for restrictions regarding the COVID-19 Pandemic (e.g. social distancing) in an effort to limit this patient's exposure and mitigate transmission in our community.  Due to his co-morbid illnesses, this patient is at least at moderate risk for complications without adequate follow up.  This format is felt to be most appropriate for this patient at this time.  All issues noted in this document were discussed and addressed.  A limited physical exam was performed with this format.  Please refer to the patient's chart for his consent to telehealth for [facility].   Date:  12/21/2018   ID:  Jeremy Simon, DOB [birth year], MRN [id number]  Patient Location:Home Provider Location: Home  PCP:  Crist FatVan Eyk, Jason, MD  Cardiologist:  Dr Jens Somrenshaw  Evaluation Performed:  Follow-Up Visit  Chief Complaint:  FU CAD  History of Present Illness:    FU coronary artery disease. He has had a previous PCI of his LAD in 2000. His most recent catheterization performed in August 2005 showed nonobstructive disease. Stress echocardiogram January 2016 showed electrographic changes, chest pain and ischemia in the LAD distribution. I discussed options with him at that time and he was felt to be asymptomatic. He elected medical therapy.  Nuclear study August 2018 showed ejection fraction 45%.  ECG positive.  However no ischemia or infarction.  Since I last saw him,  there is no dyspnea, chest pain, palpitations or syncope.  The patient does not have symptoms concerning for COVID-19 infection (fever, chills, cough, or new shortness of breath).    Past Medical History:  Diagnosis Date  . CAD   . HIATAL HERNIA   . HYPERLIPIDEMIA   . HYPERTENSION    Past Surgical History:  Procedure Laterality Date  . CARDIAC CATHETERIZATION    . NOSE SURGERY    . TRANSURETHRAL RESECTION OF PROSTATE  03/2017     Current Meds   Medication Sig  . amLODipine (NORVASC) 5 MG tablet TAKE 1 TABLET BY MOUTH EVERY DAY  . aspirin EC 81 MG tablet Take 1 tablet (81 mg total) by mouth daily.  . Calcium-Magnesium-Vitamin D (CALCIUM 500 PO) Take 1 tablet by mouth daily.  . fluticasone (FLONASE) 50 MCG/ACT nasal spray Place 1-2 sprays into both nostrils daily as needed for allergies.   . hydrochlorothiazide (HYDRODIURIL) 25 MG tablet Take 25 mg by mouth daily.  . metoprolol succinate (TOPROL-XL) 25 MG 24 hr tablet TAKE 1 TABLET BY MOUTH EVERY DAY  . rOPINIRole (REQUIP) 1 MG tablet Take 2 tablets (2 mg total) by mouth at bedtime.  . traZODone (DESYREL) 50 MG tablet Take 50 mg by mouth at bedtime.     Allergies:   Altace [ramipril]   Social History   Tobacco Use  . Smoking status: Never Smoker  . Smokeless tobacco: Never Used  Substance Use Topics  . Alcohol use: No    Alcohol/week: 0.0 standard drinks    Frequency: Never  . Drug use: No     Family Hx: The patient's family history includes Alzheimer's disease in his mother; Diabetes in his sister; Heart attack in his father; Hypertension in his father and sister; Obesity in his father and sister; Other in his daughter and son.  ROS:   Please see the history of present illness.    Some muscle pain but no Fever, chills  or productive cough All other systems reviewed and are negative.  Recent Lipid  Panel Lab Results  Component Value Date/Time   CHOL 120 03/13/2009 07:24 AM   TRIG 140.0 03/13/2009 07:24 AM   HDL 42.50 03/13/2009 07:24 AM   CHOLHDL 3 03/13/2009 07:24 AM   LDLCALC 50 03/13/2009 07:24 AM    Wt Readings from Last 3 Encounters:  12/21/18 165 lb (74.8 kg)  12/19/17 176 lb 3.2 oz (79.9 kg)  05/11/17 176 lb 6.4 oz (80 kg)     Objective:    Vital Signs:  BP 115/69   Pulse 66   Ht 6\' 1"  (1.854 m)   Wt 165 lb (74.8 kg)   BMI 21.77 kg/m    VITAL SIGNS:  reviewed NAD Answers questions appropriately Normal affect Remainder of physical  examination not performed (telehealth visit; coronavirus pandemic)  ASSESSMENT & PLAN:    1. Coronary rtery disease-patient denies recurrent chest pain.  Continue medical therapy with aspirin. 2. Hypertension-patient's blood pressure is controlled.  Continue present medications and follow. 3. Hyperlipidemia-patient stopped his Lipitor 4 days ago.  He felt this may be contributing to myalgias.  He will continue off of Lipitor for 4 weeks.  If symptoms do not improve I have asked him to resume at previous dose.  If his myalgias do improve we will try a different statin at that time.  COVID-19 Education: The importance of social distancing was discussed today.  Time:   Today, I have spent 18 minutes with the patient with telehealth technology discussing the above problems.     Medication Adjustments/Labs and Tests Ordered: Current medicines are reviewed at length with the patient today.  Concerns regarding medicines are outlined above.   Tests Ordered: No orders of the defined types were placed in this encounter.   Medication Changes: No orders of the defined types were placed in this encounter.   Follow Up:  Virtual Visit or In Person in 1 year(s)  Signed, Kirk Ruths, MD  12/21/2018 9:49 AM    [place]

## 2018-12-21 ENCOUNTER — Telehealth (INDEPENDENT_AMBULATORY_CARE_PROVIDER_SITE_OTHER): Payer: Medicare HMO | Admitting: Cardiology

## 2018-12-21 ENCOUNTER — Other Ambulatory Visit: Payer: Self-pay

## 2018-12-21 VITALS — BP 115/69 | HR 66 | Ht 73.0 in | Wt 165.0 lb

## 2018-12-21 DIAGNOSIS — I2510 Atherosclerotic heart disease of native coronary artery without angina pectoris: Secondary | ICD-10-CM

## 2018-12-21 DIAGNOSIS — E7800 Pure hypercholesterolemia, unspecified: Secondary | ICD-10-CM | POA: Diagnosis not present

## 2018-12-21 DIAGNOSIS — I10 Essential (primary) hypertension: Secondary | ICD-10-CM

## 2018-12-21 NOTE — Patient Instructions (Signed)
Medication Instructions:  DO NOT TAKE ATORVASTATIN FOR ONE MONTH- THEN CALL AND LET us KNOW HOW YOU ARE FEELING. If you need a refill on your cardiac medications before your next appointment, please call your pharmacy.   Lab work: If you have labs (blood work) drawn today and your tests are completely normal, you will receive your results only by: Marland Kitchen MyChart Message (if you have MyChart) OR . A paper copy in the mail If you have any lab test that is abnormal or we need to change your treatment, we will call you to review the results.  Follow-Up: At [facility], you and your health needs are our priority.  As part of our continuing mission to provide you with exceptional heart care, we have created designated Provider Care Teams.  These Care Teams include your primary Cardiologist (physician) and Advanced Practice Providers (APPs -  Physician Assistants and Nurse Practitioners) who all work together to provide you with the care you need, when you need it. You will need a follow up appointment in 12 months.  Please call our office 2 months in advance to schedule this appointment.  You may see Kirk Ruths MD or one of the following Advanced Practice Providers on your designated Care Team:   Kerin Ransom, PA-C Roby Lofts, [place] . Sande Rives, PA-C

## 2018-12-31 ENCOUNTER — Ambulatory Visit
Admission: RE | Admit: 2018-12-31 | Discharge: 2018-12-31 | Disposition: A | Discharge status: Home / Self Care | Payer: Medicare HMO | Source: Ambulatory Visit | Attending: Neurology | Admitting: Neurology

## 2018-12-31 ENCOUNTER — Other Ambulatory Visit: Payer: Self-pay

## 2018-12-31 DIAGNOSIS — R202 Paresthesia of skin: Secondary | ICD-10-CM

## 2018-12-31 DIAGNOSIS — G2581 Restless legs syndrome: Secondary | ICD-10-CM

## 2018-12-31 DIAGNOSIS — M545 Low back pain, unspecified: Secondary | ICD-10-CM

## 2018-12-31 NOTE — Telephone Encounter (Signed)
[employer] Medicare Josem Kaufmann: [id number] (exp. 12/28/18 to 06/26/19) patient is scheduled at GI for 12/31/18.

## 2019-01-01 ENCOUNTER — Telehealth: Payer: Self-pay | Admitting: Neurology

## 2019-01-01 NOTE — Telephone Encounter (Signed)
Please call patient, MRI of lumbar showed hemangioma within L2, and L4 vertebral bodies, degenerative changes in L4 superior endplate, there is no evidence of significant canal or foraminal narrowing.  Will review films at his next follow-up visit.  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 Also, degenerative endplate changes in L4 superior endplate with marrow edema.

## 2019-01-01 NOTE — Telephone Encounter (Signed)
Left message, on both home and mobile numbers, requesting a call back.  

## 2019-01-02 NOTE — Telephone Encounter (Signed)
I spoke to the patient and he was provided with the MRI results below.  He scheduled a follow up for further review with Dr. Krista Blue.

## 2019-01-09 ENCOUNTER — Telehealth: Payer: Self-pay | Admitting: Neurology

## 2019-01-09 NOTE — Telephone Encounter (Signed)
Reviewed note from his wife on January 08, 2019:  There was a concern of increased agitation, get frustrated easily if he misplace things, difficulty keeping his doctor's appointment, change in his facial expression, faraway look, not focus, painful expression,  His wife Ebon Ketchum [phone]

## 2019-01-15 ENCOUNTER — Ambulatory Visit: Payer: Medicare HMO | Admitting: Neurology

## 2019-01-15 ENCOUNTER — Other Ambulatory Visit: Payer: Self-pay

## 2019-01-15 ENCOUNTER — Encounter: Payer: Self-pay | Admitting: Neurology

## 2019-01-15 VITALS — BP 121/69 | HR 58 | Temp 98.7°F | Ht 73.0 in | Wt 162.0 lb

## 2019-01-15 DIAGNOSIS — G2581 Restless legs syndrome: Secondary | ICD-10-CM | POA: Diagnosis not present

## 2019-01-15 DIAGNOSIS — R202 Paresthesia of skin: Secondary | ICD-10-CM | POA: Diagnosis not present

## 2019-01-15 NOTE — Progress Notes (Signed)
PATIENT: Jeremy Simon DOB: 10/09/1945  Chief Complaint  Patient presents with  . Numbness    He is here with his wife, Lelon Frohlich, to review his MRI results.  . Cognitive Concerns    MMSE 30/30 - 11 animals.     HISTORICAL  Jeremy Simon is a 73 years old male, seen in request by his primary care physician Dr. Nona Dell, Corene Cornea for evaluation of neuropathy, initial evaluation was on December 04, 2018.  I have reviewed and summarized the referring note from the referring physician.  He had past medical history of hypertension, hyperlipidemia, coronary artery disease, status post stent  Since 2019, he noticed frequent bilateral calf muscle deep achy pain, especially at nighttime, difficulty falling to sleep, massage, pacing around did not help, he also complains of bilateral feet, toes tingling, he denies burning pain, denies gait abnormality, mild low back pain, denies significant radiating pain, denies bowel bladder incontinence, he does complains of frequent shoulder achy pain, denies upper extremity paresthesia or weakness, he also has occasionally bilateral posterior thigh muscle cramping  He was seen by [facility] orthopedic surgeon Dr.Sisco, reported EMG nerve conduction study showed evidence of peripheral neuropathy, he was given prescription of gabapentin 300 mg every night without significant improvement,  Laboratory evaluation in February 2020 showed normal CBC, CMP with exception of mild elevated glucose 116, normal TSH, LDL was 53  UPDATE Sept 8 2020: I reviewed EMG nerve conduction study [facility] orthopedics on July 20, 2018 by Lenox Ahr, DPT, ECS, showed diminished amplitude in bilateral sural, left superficial peroneal sensory study.  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  Laboratory evaluation in July 2020 showed normal negative protein electrophoresis, vitamin D, ferritin, C-reactive protein, B12, ESR, RPR, ANA, A1c was 5.4  He is also concerned about his mild memory loss, his mother suffered dementia in his 51s, occasionally sleep of memory, highly functional has no difficulty handling his daily activity, today's Mini-Mental Status Examination is 30 out of 30.  I personally reviewed MRI of lumbar in August 2020: Multilevel degenerative changes, hemangioma within L2, L4, no significant canal or foraminal narrowing  REVIEW OF SYSTEMS: Full 14 system review of systems performed and notable only for as above All other review of systems were negative.  ALLERGIES: Allergies  Allergen Reactions  . Altace [Ramipril]     Hives, lip swelling    HOME MEDICATIONS: Current Outpatient Medications  Medication Sig Dispense Refill  . amLODipine (NORVASC) 5 MG tablet TAKE 1 TABLET BY MOUTH EVERY DAY 90 tablet 0  . aspirin EC 81 MG tablet Take 1 tablet (81 mg total) by mouth daily.    Marland Kitchen atorvastatin (LIPITOR) 40 MG tablet Take 40 mg by mouth daily.    . Calcium-Magnesium-Vitamin D (CALCIUM 500 PO) Take 1 tablet by mouth daily.    . Cholecalciferol (VITAMIN D3 PO) Take 2,000 Units by mouth daily.    . fluticasone (FLONASE) 50 MCG/ACT nasal spray Place 1-2 sprays into both nostrils daily as  needed for allergies.     . hydrochlorothiazide (HYDRODIURIL) 25 MG tablet Take 25 mg by mouth daily.    . metoprolol succinate (TOPROL-XL) 25 MG 24 hr tablet TAKE 1 TABLET BY MOUTH EVERY DAY 90 tablet 0  . rOPINIRole (REQUIP) 1 MG tablet Take 2 tablets (2 mg total) by mouth at  bedtime. 60 tablet 6  . traZODone (DESYREL) 50 MG tablet Take 50 mg by mouth at bedtime.     No current facility-administered medications for this visit.     PAST MEDICAL HISTORY: Past Medical History:  Diagnosis Date  . CAD   . HIATAL HERNIA   . HYPERLIPIDEMIA   . HYPERTENSION     PAST SURGICAL HISTORY: Past Surgical History:  Procedure Laterality Date  . CARDIAC CATHETERIZATION    . NOSE SURGERY    . TRANSURETHRAL RESECTION OF PROSTATE  03/2017    FAMILY HISTORY: Family History  Problem Relation Age of Onset  . Alzheimer's disease Mother        SOME TYPE OF VALUE DISORDER  . Heart attack Father   . Hypertension Father   . Obesity Father   . Hypertension Sister   . Diabetes Sister   . Obesity Sister   . Other Son        NO HEALTH PROBLEMS  . Other Daughter        NO HEALTH PROBLEMS    SOCIAL HISTORY: Social History   Socioeconomic History  . Marital status: Married    Spouse name: Not on file  . Number of children: Not on file  . Years of education: Not on file  . Highest education level: Not on file  Occupational History  . Not on file  Social Needs  . Financial resource strain: Not on file  . Food insecurity    Worry: Not on file    Inability: Not on file  . Transportation needs    Medical: Not on file    Non-medical: Not on file  Tobacco Use  . Smoking status: Never Smoker  . Smokeless tobacco: Never Used  Substance and Sexual Activity  . Alcohol use: No    Alcohol/week: 0.0 standard drinks    Frequency: Never  . Drug use: No  . Sexual activity: Not on file  Lifestyle  . Physical activity    Days per week: Not on file    Minutes per session: Not on file  . Stress: Not on file  Relationships  . Social Herbalist on phone: Not on file    Gets together: Not on file    Attends religious service: Not on file    Active member of club or organization: Not on file    Attends meetings of clubs or organizations: Not on file     Relationship status: Not on file  . Intimate partner violence    Fear of current or ex partner: Not on file    Emotionally abused: Not on file    Physically abused: Not on file    Forced sexual activity: Not on file  Other Topics Concern  . Not on file  Social History Narrative   Lives   Caffeine use:      PHYSICAL EXAM   Vitals:   01/15/19 1123  BP: 121/69  Pulse: (!) 58  Temp: 98.7 F (37.1 C)  Weight: 162 lb (73.5 kg)  Height: 6' 1"  (1.854 m)    Not recorded      Body mass index  is 21.37 kg/m.  PHYSICAL EXAMNIATION:  Gen: NAD, conversant, well nourised, well groomed                     Cardiovascular: Regular rate rhythm, no peripheral edema, warm, nontender. Eyes: Conjunctivae clear without exudates or hemorrhage Neck: Supple, no carotid bruits. Pulmonary: Clear to auscultation bilaterally   NEUROLOGICAL EXAM: MMSE - Mini Mental State Exam 01/15/2019  Orientation to time 5  Orientation to Place 5  Registration 3  Attention/ Calculation 5  Recall 3  Language- name 2 objects 2  Language- repeat 1  Language- follow 3 step command 3  Language- read & follow direction 1  Write a sentence 1  Copy design 1  Total score 30     CRANIAL NERVES: CN II: Visual fields are full to confrontation.  Pupils are round equal and briskly reactive to light. CN III, IV, VI: extraocular movement are normal. No ptosis. CN V: Facial sensation is intact to pinprick in all 3 divisions bilaterally. Corneal responses are intact.  CN VII: Face is symmetric with normal eye closure and smile. CN VIII: Hearing is normal to casual conversation CN IX, X: Palate elevates symmetrically. Phonation is normal. CN XI: Head turning and shoulder shrug are intact CN XII: Tongue is midline with normal movements and no atrophy.  MOTOR: Normal muscle tone, bulk, and strength  REFLEXES: Reflexes are 1 and symmetric at the biceps, triceps, knees, and absent at ankles. Plantar responses are  flexor.  SENSORY: Intact to light touch, pinprick, positional sensation and vibratory sensation are intact in fingers and toes.  COORDINATION: Rapid alternating movements and fine finger movements are intact. There is no dysmetria on finger-to-nose and heel-knee-shin.    GAIT/STANCE: He has flatfoot, steady gait, mild difficulty perform tiptoe and heel walking  DIAGNOSTIC DATA (LABS, IMAGING, TESTING) - I reviewed patient records, labs, notes, testing and imaging myself where available.   ASSESSMENT AND PLAN  MAZEN MARCIN is a 73 y.o. male   Bilateral feet paresthesia, bilateral calf muscle deep achy pain, frequent thigh muscle cramping Restless leg syndrome  Laboratory evaluation showed no treatable etiology,  Possible peripheral neuropathy based on outside EMG nerve conduction study  Symptoms overall are under good control,   He is not sure about the benefit of Requip, he may taper off, if he has worsening of the symptoms, may add on Requip 1 mg 2 tablets every night  Jeremy Simon, M.D. Ph.D.  [facility] [address], [place], [place] [address] Ph: [phone] Fax: [phone]  CC: Townsend Roger, MD  Addendum:

## 2019-01-22 DIAGNOSIS — R69 Illness, unspecified: Secondary | ICD-10-CM | POA: Diagnosis not present

## 2019-01-25 ENCOUNTER — Other Ambulatory Visit: Payer: Self-pay | Admitting: Cardiology

## 2019-02-15 DIAGNOSIS — H938X1 Other specified disorders of right ear: Secondary | ICD-10-CM | POA: Diagnosis not present

## 2019-02-15 DIAGNOSIS — Z8669 Personal history of other diseases of the nervous system and sense organs: Secondary | ICD-10-CM | POA: Diagnosis not present

## 2019-02-15 DIAGNOSIS — H6123 Impacted cerumen, bilateral: Secondary | ICD-10-CM | POA: Diagnosis not present

## 2019-02-15 DIAGNOSIS — H9190 Unspecified hearing loss, unspecified ear: Secondary | ICD-10-CM | POA: Diagnosis not present

## 2019-03-04 DIAGNOSIS — J302 Other seasonal allergic rhinitis: Secondary | ICD-10-CM | POA: Diagnosis not present

## 2019-03-04 DIAGNOSIS — G629 Polyneuropathy, unspecified: Secondary | ICD-10-CM | POA: Diagnosis not present

## 2019-03-04 DIAGNOSIS — I10 Essential (primary) hypertension: Secondary | ICD-10-CM | POA: Diagnosis not present

## 2019-03-14 ENCOUNTER — Other Ambulatory Visit: Payer: Self-pay | Admitting: Neurology

## 2019-03-15 ENCOUNTER — Other Ambulatory Visit: Payer: Self-pay | Admitting: *Deleted

## 2019-03-15 DIAGNOSIS — I10 Essential (primary) hypertension: Secondary | ICD-10-CM

## 2019-03-15 MED ORDER — METOPROLOL SUCCINATE ER 25 MG PO TB24: 25.0000 mg | ORAL_TABLET | Freq: Every day | ORAL | 0 refills | Status: DC

## 2019-03-15 NOTE — Telephone Encounter (Signed)
Rx request sent to pharmacy.  

## 2019-03-28 DIAGNOSIS — M7989 Other specified soft tissue disorders: Secondary | ICD-10-CM | POA: Diagnosis not present

## 2019-04-04 ENCOUNTER — Other Ambulatory Visit: Payer: Self-pay | Admitting: Cardiology

## 2019-04-04 DIAGNOSIS — I10 Essential (primary) hypertension: Secondary | ICD-10-CM

## 2019-04-08 DIAGNOSIS — R69 Illness, unspecified: Secondary | ICD-10-CM | POA: Diagnosis not present

## 2019-04-09 ENCOUNTER — Other Ambulatory Visit: Payer: Self-pay | Admitting: Cardiology

## 2019-04-09 NOTE — Telephone Encounter (Signed)
°*  STAT* If patient is at the pharmacy, call can be transferred to refill team.   1. Which medications need to be refilled? (please list name of each medication and dose if known) atorvastatin (LIPITOR) 40 MG tablet  2. Which pharmacy/location (including street and city if local pharmacy) is medication to be sent to? [id number] - [place], [place]  3. Do they need a 30 day or 90 day supply? 90 day  Patient is completely out of medication.

## 2019-04-10 MED ORDER — ATORVASTATIN CALCIUM 40 MG PO TABS: 40.0000 mg | ORAL_TABLET | Freq: Every day | ORAL | 1 refills | Status: DC

## 2019-04-10 NOTE — Telephone Encounter (Signed)
Requested Prescriptions   Signed Prescriptions Disp Refills  . atorvastatin (LIPITOR) 40 MG tablet 90 tablet 1    Sig: Take 1 tablet (40 mg total) by mouth daily.    Authorizing Provider: Lelon Perla    Ordering User: Raelene Bott, Nithila Sumners L

## 2019-04-23 DIAGNOSIS — M9903 Segmental and somatic dysfunction of lumbar region: Secondary | ICD-10-CM | POA: Diagnosis not present

## 2019-04-23 DIAGNOSIS — M9902 Segmental and somatic dysfunction of thoracic region: Secondary | ICD-10-CM | POA: Diagnosis not present

## 2019-04-23 DIAGNOSIS — M542 Cervicalgia: Secondary | ICD-10-CM | POA: Diagnosis not present

## 2019-04-23 DIAGNOSIS — M9901 Segmental and somatic dysfunction of cervical region: Secondary | ICD-10-CM | POA: Diagnosis not present

## 2019-04-23 DIAGNOSIS — M546 Pain in thoracic spine: Secondary | ICD-10-CM | POA: Diagnosis not present

## 2019-04-29 DIAGNOSIS — N3020 Other chronic cystitis without hematuria: Secondary | ICD-10-CM | POA: Diagnosis not present

## 2019-04-29 DIAGNOSIS — N401 Enlarged prostate with lower urinary tract symptoms: Secondary | ICD-10-CM | POA: Diagnosis not present

## 2019-04-29 DIAGNOSIS — N400 Enlarged prostate without lower urinary tract symptoms: Secondary | ICD-10-CM | POA: Diagnosis not present

## 2019-04-29 DIAGNOSIS — N318 Other neuromuscular dysfunction of bladder: Secondary | ICD-10-CM | POA: Diagnosis not present

## 2019-04-30 ENCOUNTER — Other Ambulatory Visit: Payer: Self-pay | Admitting: *Deleted

## 2019-04-30 DIAGNOSIS — I872 Venous insufficiency (chronic) (peripheral): Secondary | ICD-10-CM | POA: Diagnosis not present

## 2019-05-07 DIAGNOSIS — M546 Pain in thoracic spine: Secondary | ICD-10-CM | POA: Diagnosis not present

## 2019-05-07 DIAGNOSIS — M9901 Segmental and somatic dysfunction of cervical region: Secondary | ICD-10-CM | POA: Diagnosis not present

## 2019-05-07 DIAGNOSIS — M542 Cervicalgia: Secondary | ICD-10-CM | POA: Diagnosis not present

## 2019-05-07 DIAGNOSIS — M9902 Segmental and somatic dysfunction of thoracic region: Secondary | ICD-10-CM | POA: Diagnosis not present

## 2019-05-07 DIAGNOSIS — M9903 Segmental and somatic dysfunction of lumbar region: Secondary | ICD-10-CM | POA: Diagnosis not present

## 2019-06-03 DIAGNOSIS — M7741 Metatarsalgia, right foot: Secondary | ICD-10-CM | POA: Diagnosis not present

## 2019-06-03 DIAGNOSIS — G609 Hereditary and idiopathic neuropathy, unspecified: Secondary | ICD-10-CM | POA: Diagnosis not present

## 2019-06-03 DIAGNOSIS — M2011 Hallux valgus (acquired), right foot: Secondary | ICD-10-CM | POA: Diagnosis not present

## 2019-06-04 IMAGING — NM NM MISC PROCEDURE
9 series · 54 of 54 positions shown · non-contrast
Comparison: none

[Series 1: rest sax · 6.4mm · 6.40mm/px · 6 of 22 frames shown]
[frame 2/22]
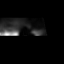
[frame 6/22]
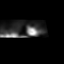
[frame 10/22]
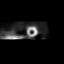
[frame 13/22]
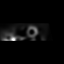
[frame 17/22]
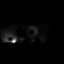
[frame 21/22]
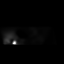

[Series 1: wbr_r-proj_st wbr rest · 6.40mm/px · 6 of 64 frames shown]
[frame 6/64]
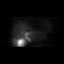
[frame 16/64]
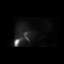
[frame 27/64]
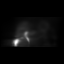
[frame 38/64]
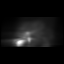
[frame 48/64]
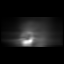
[frame 59/64]
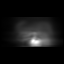

[Series 1: wbr rest · 6.40mm/px · 6 of 64 frames shown]
[frame 6/64]
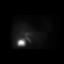
[frame 16/64]
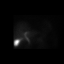
[frame 27/64]
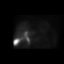
[frame 38/64]
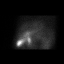
[frame 48/64]
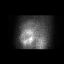
[frame 59/64]
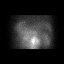

[Series 2: wbr stress-gsp · 6.40mm/px · 6 of 512 frames shown]
[frame 43/512]
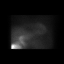
[frame 128/512]
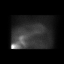
[frame 214/512]
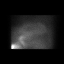
[frame 299/512]
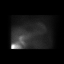
[frame 384/512]
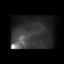
[frame 470/512]
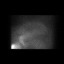

[Series 2: stress sax gs · 6.4mm · 6.40mm/px · 6 of 176 frames shown]
[frame 15/176]
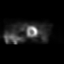
[frame 44/176]
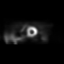
[frame 74/176]
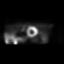
[frame 103/176]
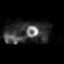
[frame 132/176]
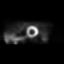
[frame 162/176]
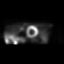

[Series 2: stress sax · 6.4mm · 6.40mm/px · 6 of 22 frames shown]
[frame 2/22]
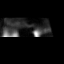
[frame 6/22]
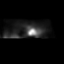
[frame 10/22]
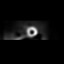
[frame 13/22]
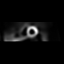
[frame 17/22]
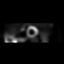
[frame 21/22]
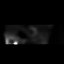

[Series 2: wbr_s-proj_st wbr stress-gsp · 6.40mm/px · 6 of 512 frames shown]
[frame 43/512]
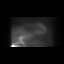
[frame 128/512]
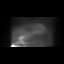
[frame 214/512]
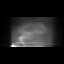
[frame 299/512]
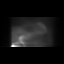
[frame 384/512]
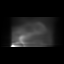
[frame 470/512]
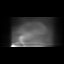

[Series 3: wbr_s-proj_st wbr stress-sum-em · 6.40mm/px · 6 of 64 frames shown]
[frame 6/64]
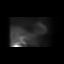
[frame 16/64]
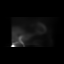
[frame 27/64]
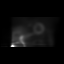
[frame 38/64]
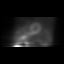
[frame 48/64]
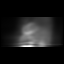
[frame 59/64]
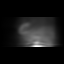

[Series 3: wbr stress-sum-em · 6.40mm/px · 6 of 64 frames shown]
[frame 6/64]
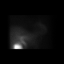
[frame 16/64]
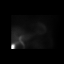
[frame 27/64]
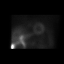
[frame 38/64]
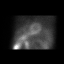
[frame 48/64]
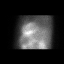
[frame 59/64]
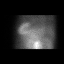

[54 of 54 positions shown; findings below may reference images not displayed]

Canned report from images found in remote index.

Refer to host system for actual result text.

## 2019-06-10 DIAGNOSIS — L853 Xerosis cutis: Secondary | ICD-10-CM | POA: Diagnosis not present

## 2019-06-13 DIAGNOSIS — H524 Presbyopia: Secondary | ICD-10-CM | POA: Diagnosis not present

## 2019-06-13 DIAGNOSIS — Z961 Presence of intraocular lens: Secondary | ICD-10-CM | POA: Diagnosis not present

## 2019-06-13 DIAGNOSIS — Z0100 Encounter for examination of eyes and vision without abnormal findings: Secondary | ICD-10-CM | POA: Diagnosis not present

## 2019-07-01 DIAGNOSIS — Z955 Presence of coronary angioplasty implant and graft: Secondary | ICD-10-CM | POA: Diagnosis not present

## 2019-07-01 DIAGNOSIS — Z0000 Encounter for general adult medical examination without abnormal findings: Secondary | ICD-10-CM | POA: Diagnosis not present

## 2019-07-01 DIAGNOSIS — Z125 Encounter for screening for malignant neoplasm of prostate: Secondary | ICD-10-CM | POA: Diagnosis not present

## 2019-07-01 DIAGNOSIS — Z79899 Other long term (current) drug therapy: Secondary | ICD-10-CM | POA: Diagnosis not present

## 2019-07-01 DIAGNOSIS — I2510 Atherosclerotic heart disease of native coronary artery without angina pectoris: Secondary | ICD-10-CM | POA: Diagnosis not present

## 2019-07-01 DIAGNOSIS — E785 Hyperlipidemia, unspecified: Secondary | ICD-10-CM | POA: Diagnosis not present

## 2019-07-01 DIAGNOSIS — I10 Essential (primary) hypertension: Secondary | ICD-10-CM | POA: Diagnosis not present

## 2019-07-01 DIAGNOSIS — G4700 Insomnia, unspecified: Secondary | ICD-10-CM | POA: Diagnosis not present

## 2019-07-01 DIAGNOSIS — G2581 Restless legs syndrome: Secondary | ICD-10-CM | POA: Diagnosis not present

## 2019-07-01 DIAGNOSIS — Z9229 Personal history of other drug therapy: Secondary | ICD-10-CM | POA: Diagnosis not present

## 2019-07-01 DIAGNOSIS — Z131 Encounter for screening for diabetes mellitus: Secondary | ICD-10-CM | POA: Diagnosis not present

## 2019-07-11 DIAGNOSIS — M546 Pain in thoracic spine: Secondary | ICD-10-CM | POA: Diagnosis not present

## 2019-07-11 DIAGNOSIS — M9902 Segmental and somatic dysfunction of thoracic region: Secondary | ICD-10-CM | POA: Diagnosis not present

## 2019-07-11 DIAGNOSIS — M9903 Segmental and somatic dysfunction of lumbar region: Secondary | ICD-10-CM | POA: Diagnosis not present

## 2019-07-11 DIAGNOSIS — M542 Cervicalgia: Secondary | ICD-10-CM | POA: Diagnosis not present

## 2019-07-11 DIAGNOSIS — M9901 Segmental and somatic dysfunction of cervical region: Secondary | ICD-10-CM | POA: Diagnosis not present

## 2019-07-15 ENCOUNTER — Other Ambulatory Visit: Payer: Self-pay

## 2019-07-15 ENCOUNTER — Encounter: Payer: Self-pay | Admitting: Neurology

## 2019-07-15 ENCOUNTER — Ambulatory Visit: Payer: Medicare HMO | Admitting: Neurology

## 2019-07-15 VITALS — BP 140/77 | HR 59 | Temp 96.8°F | Ht 73.0 in | Wt 165.8 lb

## 2019-07-15 DIAGNOSIS — G2581 Restless legs syndrome: Secondary | ICD-10-CM

## 2019-07-15 DIAGNOSIS — R202 Paresthesia of skin: Secondary | ICD-10-CM

## 2019-07-15 MED ORDER — DULOXETINE HCL 30 MG PO CPEP: 30.0000 mg | ORAL_CAPSULE | Freq: Every day | ORAL | 1 refills | Status: DC

## 2019-07-15 NOTE — Patient Instructions (Signed)
Try taking Cymbalta 30 mg daily  Continue taking the Requip  See you back in 6 months

## 2019-07-15 NOTE — Progress Notes (Signed)
I have reviewed and agreed above plan. 

## 2019-07-15 NOTE — Progress Notes (Signed)
PATIENT: Jeremy Simon DOB: June 19, 1945  REASON FOR VISIT: follow up HISTORY FROM: patient  HISTORY OF PRESENT ILLNESS: Today 07/15/19  HISTORY  Jeremy Simon is a 74 years old male, seen in request by his primary care physician Dr. Nona Dell, Corene Cornea for evaluation of neuropathy, initial evaluation was on December 04, 2018.  I have reviewed and summarized the referring note from the referring physician.  He had past medical history of hypertension, hyperlipidemia, coronary artery disease, status post stent  Since 2019, he noticed frequent bilateral calf muscle deep achy pain, especially at nighttime, difficulty falling to sleep, massage, pacing around did not help, he also complains of bilateral feet, toes tingling, he denies burning pain, denies gait abnormality, mild low back pain, denies significant radiating pain, denies bowel bladder incontinence, he does complains of frequent shoulder achy pain, denies upper extremity paresthesia or weakness, he also has occasionally bilateral posterior thigh muscle cramping  He was seen by [facility] orthopedic surgeon Dr.Sisco, reported EMG nerve conduction study showed evidence of peripheral neuropathy, he was given prescription of gabapentin 300 mg every night without significant improvement,  Laboratory evaluation in February 2020 showed normal CBC, CMP with exception of mild elevated glucose 116, normal TSH, LDL was 53  UPDATE Sept 8 2020: I reviewed EMG nerve conduction study [facility] orthopedics on July 20, 2018 by Lenox Ahr, DPT, ECS, showed diminished amplitude in bilateral sural, left superficial peroneal sensory study.  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  Laboratory evaluation in July 2020 showed normal negative protein electrophoresis, vitamin D, ferritin, C-reactive protein, B12, ESR, RPR, ANA, A1c was 5.4  He is also concerned about his mild memory loss, his mother suffered dementia in his 11s, occasionally sleep of memory, highly functional has no difficulty handling his daily activity, today's Mini-Mental Status Examination is 30 out of 30.  I personally reviewed MRI of lumbar in August 2020: Multilevel degenerative changes, hemangioma within L2, L4, no significant canal or foraminal narrowing   Update July 15, 2019 SS: Here today with his wife, continues to complain of burning in his calves, tingling in his feet, constant.  He has difficulty sleeping at night, but this has improved with higher dose trazodone 150 mg.  He is taking Requip 2 mg at bedtime, 10 pm.  He describes more of neuropathy symptoms, burning, tingling, that bother him, versus sudden urge to move his legs.  He wakes up every morning around 3 AM, not because of his neuropathy, but because he cannot sleep.  Will toss and turn 3-6 am.  Has seen a podiatrist, has orthotics, good support shoes, right metatarsalgia. Saw orthopedic, for swelling in the right foot, amlodipine was discontinued, wearing TED hose, now on HCTZ.  He used to be able to walk 1.5 miles, now has to stop around 1 mile due to his legs being tired, feeling heavy.   REVIEW OF SYSTEMS: Out of a complete 14 system review of symptoms, the patient complains only of the following  symptoms, and all other reviewed systems are negative.  Burning, tingling  ALLERGIES: Allergies  Allergen Reactions  . Altace [Ramipril]     Hives, lip swelling  . Norvasc [Amlodipine]  Swelling    Left foot swelling    HOME MEDICATIONS: Outpatient Medications Prior to Visit  Medication Sig Dispense Refill  . acetaminophen (TYLENOL) 500 MG tablet Take 1,000 mg by mouth as needed.    Marland Kitchen aspirin EC 81 MG tablet Take 1 tablet (81 mg total) by mouth daily.    Marland Kitchen atorvastatin (LIPITOR) 40 MG tablet Take 1 tablet (40 mg total) by mouth daily. 90 tablet 1  . Calcium-Magnesium-Vitamin D (CALCIUM 500 PO) Take 1 tablet by mouth daily.    . Cholecalciferol (VITAMIN D3 PO) Take 2,000 Units by mouth daily.    . fluticasone (FLONASE) 50 MCG/ACT nasal spray Place 1-2 sprays into both nostrils daily as needed for allergies.     . hydrochlorothiazide (HYDRODIURIL) 25 MG tablet Take 25 mg by mouth daily.    . metoprolol succinate (TOPROL-XL) 25 MG 24 hr tablet TAKE 1 TABLET BY MOUTH EVERY DAY 90 tablet 3  . rOPINIRole (REQUIP) 1 MG tablet TAKE 2 TABLETS (2 MG TOTAL) BY MOUTH AT BEDTIME. 180 tablet 3  . traZODone (DESYREL) 50 MG tablet Take 150 mg by mouth at bedtime.      No facility-administered medications prior to visit.    PAST MEDICAL HISTORY: Past Medical History:  Diagnosis Date  . CAD   . HIATAL HERNIA   . HYPERLIPIDEMIA   . HYPERTENSION     PAST SURGICAL HISTORY: Past Surgical History:  Procedure Laterality Date  . CARDIAC CATHETERIZATION    . NOSE SURGERY    . TRANSURETHRAL RESECTION OF PROSTATE  03/2017    FAMILY HISTORY: Family History  Problem Relation Age of Onset  . Alzheimer's disease Mother        SOME TYPE OF VALUE DISORDER  . Heart attack Father   . Hypertension Father   . Obesity Father   . Hypertension Sister   . Diabetes Sister   . Obesity Sister   . Other Son        NO HEALTH PROBLEMS  . Other Daughter        NO HEALTH PROBLEMS    SOCIAL HISTORY: Social History   Socioeconomic History  . Marital status: Married    Spouse name: Not on file  . Number of children: Not on file  . Years of education: Not on file  . Highest education  level: Not on file  Occupational History  . Not on file  Tobacco Use  . Smoking status: Never Smoker  . Smokeless tobacco: Never Used  Substance and Sexual Activity  . Alcohol use: No    Alcohol/week: 0.0 standard drinks  . Drug use: No  . Sexual activity: Not on file  Other Topics Concern  . Not on file  Social History Narrative   Lives   Caffeine use:    Social Determinants of Health   Financial Resource Strain:   . Difficulty of Paying Living Expenses: Not on file  Food Insecurity:   . Worried About Charity fundraiser in the Last Year: Not on file  . Ran Out of Food in the Last Year: Not on file  Transportation Needs:   . Lack of Transportation (Medical): Not on file  . Lack of Transportation (Non-Medical): Not on file  Physical Activity:   . Days of Exercise per Week: Not  on file  . Minutes of Exercise per Session: Not on file  Stress:   . Feeling of Stress : Not on file  Social Connections:   . Frequency of Communication with Friends and Family: Not on file  . Frequency of Social Gatherings with Friends and Family: Not on file  . Attends Religious Services: Not on file  . Active Member of Clubs or Organizations: Not on file  . Attends Archivist Meetings: Not on file  . Marital Status: Not on file  Intimate Partner Violence:   . Fear of Current or Ex-Partner: Not on file  . Emotionally Abused: Not on file  . Physically Abused: Not on file  . Sexually Abused: Not on file   PHYSICAL EXAM  Vitals:   07/15/19 0957  BP: 140/77  Pulse: (!) 59  Temp: (!) 96.8 F (36 C)  TempSrc: Oral  Weight: 165 lb 12.8 oz (75.2 kg)  Height: 6' 1"  (1.854 m)   Body mass index is 21.87 kg/m.  Generalized: Well developed, in no acute distress   Neurological examination  Mentation: Alert oriented to time, place, history taking. Follows all commands speech and language fluent Cranial nerve II-XII: Pupils were equal round reactive to light. Extraocular movements  were full, visual field were full on confrontational test. Facial sensation and strength were normal. Head turning and shoulder shrug were normal and symmetric. Motor: The motor testing reveals 5 over 5 strength of all 4 extremities. Good symmetric motor tone is noted throughout.  Sensory: Decreased pinprick and vibration sensation to ankle bilaterally Coordination: Cerebellar testing reveals good finger-nose-finger and heel-to-shin bilaterally.  Gait and station: Gait is normal. Tandem gait is slightly unsteady.  Mild difficulty walking on heels and tiptoe Reflexes: Deep tendon reflexes are 1+ biceps, triceps, knee bilaterally  DIAGNOSTIC DATA (LABS, IMAGING, TESTING) - I reviewed patient records, labs, notes, testing and imaging myself where available.  Lab Results  Component Value Date   WBC 7.6 05/11/2017   HGB 14.3 05/11/2017   HCT 42.0 05/11/2017   MCV 90 05/11/2017   PLT 240 05/11/2017      Component Value Date/Time   NA 144 05/11/2017 1019   K 4.2 05/11/2017 1019   CL 104 05/11/2017 1019   CO2 23 05/11/2017 1019   GLUCOSE 85 05/11/2017 1019   BUN 18 05/11/2017 1019   CREATININE 0.75 (L) 05/11/2017 1019   CALCIUM 9.2 05/11/2017 1019   PROT 6.8 12/04/2018 0902   ALBUMIN 4.6 05/11/2017 1019   AST 19 05/11/2017 1019   ALT 18 05/11/2017 1019   ALKPHOS 94 05/11/2017 1019   BILITOT 0.7 05/11/2017 1019   GFRNONAA 93 05/11/2017 1019   GFRAA 107 05/11/2017 1019   Lab Results  Component Value Date   CHOL 120 03/13/2009   HDL 42.50 03/13/2009   LDLCALC 50 03/13/2009   TRIG 140.0 03/13/2009   CHOLHDL 3 03/13/2009   Lab Results  Component Value Date   HGBA1C 5.4 12/04/2018   Lab Results  Component Value Date   VITAMINB12 390 12/04/2018   No results found for: TSH  ASSESSMENT AND PLAN 74 y.o. year old male  has a past medical history of CAD, HIATAL HERNIA, HYPERLIPIDEMIA, and HYPERTENSION. here with:  1.  Bilateral feet paresthesia, bilateral calf muscle deep aching  pain, frequent thigh muscle cramping 2.  Restless leg syndrome -Laboratory evaluation showed no treatable etiology -Possible peripheral neuropathy based on outside EMG nerve conduction study, recent A1C 5.5 -Continues to complain of burning  in his calves, tingling in his feet, constant, feels more trouble with exercise, legs are heavy -Try Cymbalta 30 mg daily for symptom management, is taking trazodone 150 mg at bedtime for sleep, discussed signs of serotonin syndrome -Previously, no benefit with gabapentin -He is unsure of the benefit of Requip, but will remain on 1 mg, 2 tablets at bedtime, he may try to taper off this -He mostly has insomnia issues, not so much that his leg and feet symptoms wake him up -Follow-up in 6 months or sooner if needed   I spent 15 minutes with the patient. 50% of this time was spent discussing his plan of care.   Butler Denmark, AGNP-C, DNP 07/15/2019, 11:41 AM [facility] [address] [place], [place] [address] [phone]

## 2019-07-16 ENCOUNTER — Encounter: Payer: Self-pay | Admitting: Neurology

## 2019-08-07 ENCOUNTER — Ambulatory Visit: Payer: Medicare HMO | Admitting: Cardiology

## 2019-08-07 ENCOUNTER — Other Ambulatory Visit: Payer: Self-pay

## 2019-08-07 ENCOUNTER — Other Ambulatory Visit: Payer: Self-pay | Admitting: *Deleted

## 2019-08-07 ENCOUNTER — Encounter: Payer: Self-pay | Admitting: Cardiology

## 2019-08-07 VITALS — BP 116/62 | HR 54 | Ht 72.5 in | Wt 168.8 lb

## 2019-08-07 DIAGNOSIS — I10 Essential (primary) hypertension: Secondary | ICD-10-CM | POA: Diagnosis not present

## 2019-08-07 DIAGNOSIS — E7800 Pure hypercholesterolemia, unspecified: Secondary | ICD-10-CM

## 2019-08-07 DIAGNOSIS — I2510 Atherosclerotic heart disease of native coronary artery without angina pectoris: Secondary | ICD-10-CM | POA: Diagnosis not present

## 2019-08-07 DIAGNOSIS — R072 Precordial pain: Secondary | ICD-10-CM

## 2019-08-07 MED ORDER — SODIUM CHLORIDE 0.9% FLUSH: 3.0000 mL | Freq: Two times a day (BID) | INTRAVENOUS | Status: DC

## 2019-08-07 MED ORDER — ATORVASTATIN CALCIUM 80 MG PO TABS: 80.0000 mg | ORAL_TABLET | Freq: Every day | ORAL | 3 refills | Status: AC

## 2019-08-07 NOTE — Progress Notes (Signed)
HPI: FU coronary artery disease. He has had a previous PCI of his LAD in 2000. His most recent catheterization performed in August 2005 showed nonobstructive disease. Stress echocardiogram January 2016 showed electrographic changes, chest pain and ischemia in the LAD distribution. I discussed options with him at that time and he was felt to be asymptomatic. He elected medical therapy.Nuclear study August 2018 showed ejection fraction 45%. ECG positive. However no ischemia or infarction. Since I last saw him,in the past week he has had 3 separate episodes of substernal chest pain while pushing his lawn more relieved with rest. No radiation. There is dyspnea but no nausea or diaphoresis. No symptoms with normal activities or at rest.  Current Outpatient Medications  Medication Sig Dispense Refill  . acetaminophen (TYLENOL) 500 MG tablet Take 1,000 mg by mouth as needed.    Marland Kitchen aspirin EC 81 MG tablet Take 1 tablet (81 mg total) by mouth daily.    Marland Kitchen atorvastatin (LIPITOR) 40 MG tablet Take 1 tablet (40 mg total) by mouth daily. 90 tablet 1  . Calcium-Magnesium-Vitamin D (CALCIUM 500 PO) Take 1 tablet by mouth daily.    . Cholecalciferol (VITAMIN D3 PO) Take 2,000 Units by mouth daily.    . DULoxetine (CYMBALTA) 30 MG capsule Take 1 capsule (30 mg total) by mouth daily. 90 capsule 1  . fluticasone (FLONASE) 50 MCG/ACT nasal spray Place 1-2 sprays into both nostrils daily as needed for allergies.     . hydrochlorothiazide (HYDRODIURIL) 25 MG tablet Take 25 mg by mouth daily.    . metoprolol succinate (TOPROL-XL) 25 MG 24 hr tablet TAKE 1 TABLET BY MOUTH EVERY DAY 90 tablet 3  . rOPINIRole (REQUIP) 1 MG tablet TAKE 2 TABLETS (2 MG TOTAL) BY MOUTH AT BEDTIME. 180 tablet 3  . traZODone (DESYREL) 50 MG tablet Take 150 mg by mouth at bedtime.      No current facility-administered medications for this visit.     Past Medical History:  Diagnosis Date  . CAD   . HIATAL HERNIA   .  HYPERLIPIDEMIA   . HYPERTENSION     Past Surgical History:  Procedure Laterality Date  . CARDIAC CATHETERIZATION    . NOSE SURGERY    . TRANSURETHRAL RESECTION OF PROSTATE  03/2017    Social History   Socioeconomic History  . Marital status: Married    Spouse name: Not on file  . Number of children: Not on file  . Years of education: Not on file  . Highest education level: Not on file  Occupational History  . Not on file  Tobacco Use  . Smoking status: Never Smoker  . Smokeless tobacco: Never Used  Substance and Sexual Activity  . Alcohol use: No    Alcohol/week: 0.0 standard drinks  . Drug use: No  . Sexual activity: Not on file  Other Topics Concern  . Not on file  Social History Narrative   Lives   Caffeine use:    Social Determinants of Health   Financial Resource Strain:   . Difficulty of Paying Living Expenses:   Food Insecurity:   . Worried About Programme researcher, broadcasting/film/video in the Last Year:   . Barista in the Last Year:   Transportation Needs:   . Freight forwarder (Medical):   Marland Kitchen Lack of Transportation (Non-Medical):   Physical Activity:   . Days of Exercise per Week:   . Minutes of Exercise per Session:  Stress:   . Feeling of Stress :   Social Connections:   . Frequency of Communication with Friends and Family:   . Frequency of Social Gatherings with Friends and Family:   . Attends Religious Services:   . Active Member of Clubs or Organizations:   . Attends Archivist Meetings:   Marland Kitchen Marital Status:   Intimate Partner Violence:   . Fear of Current or Ex-Partner:   . Emotionally Abused:   Marland Kitchen Physically Abused:   . Sexually Abused:     Family History  Problem Relation Age of Onset  . Alzheimer's disease Mother        SOME TYPE OF VALUE DISORDER  . Heart attack Father   . Hypertension Father   . Obesity Father   . Hypertension Sister   . Diabetes Sister   . Obesity Sister   . Other Son        NO HEALTH PROBLEMS  . Other  Daughter        NO HEALTH PROBLEMS    ROS: no fevers or chills, productive cough, hemoptysis, dysphasia, odynophagia, melena, hematochezia, dysuria, hematuria, rash, seizure activity, orthopnea, PND, pedal edema, claudication. Remaining systems are negative.  Physical Exam: Well-developed well-nourished in no acute distress.  Skin is warm and dry.  HEENT is normal.  Neck is supple.  Chest is clear to auscultation with normal expansion.  Cardiovascular exam is regular rate and rhythm.  Abdominal exam nontender or distended. No masses palpated. Extremities show no edema. neuro grossly intact  ECG-sinus bradycardia with no ST changes.  Personally reviewed  A/P  1 chest pain-patient has symptoms that are consistent with recurrent angina. He has had no rest symptoms. We will continue with present medications including metoprolol, aspirin and statin. Plan cardiac catheterization. The risks and benefits including myocardial infarction, CVA and death discussed and he agrees to proceed.  2 coronary artery disease- Plan to continue aspirin and statin.  3 hypertension-blood pressure controlled.  Continue present medical regimen.  4 hyperlipidemia-continue Lipitor but will increase to 80 mg daily. Check lipids and liver in 12 weeks.  Kirk Ruths, MD

## 2019-08-07 NOTE — Patient Instructions (Addendum)
    [facility]ARDIOVASCULAR DIV[facility] [place] [address], [address] [place] [address] Dept: [phone] Loc: [phone]  NUH LIPTON  08/07/2019  You are scheduled for a Cardiac Catheterization on Wednesday, April 7 with Dr. Verne Carrow.  1. Please arrive at the [facility] (Main Entrance A) at [facility]: [address] [place], [place] [address] at 6:30 AM (This time is two hours before your procedure to ensure your preparation). Free valet parking service is available.   Special note: Every effort is made to have your procedure done on time. Please understand that emergencies sometimes delay scheduled procedures.  2. Diet: Do not eat solid foods after midnight.  The patient may have clear liquids until 5am upon the day of the procedure.  3. Labs: HAVE LAB WORK TODAY.  GO TO [address] ON Saturday 08/10/19 @ 11:30 AM  4. Medication instructions in preparation for your procedure:  On the morning of your procedure, take your Aspirin and any morning medicines NOT listed above.  You may use sips of water.  5. Plan for one night stay--bring personal belongings. 6. Bring a current list of your medications and current insurance cards. 7. You MUST have a responsible person to drive you home. 8. Someone MUST be with you the first 24 hours after you arrive home or your discharge will be delayed. 9. Please wear clothes that are easy to get on and off and wear slip-on shoes.  Thank you for allowing [place] to care for you!   -- [facility]   INCREASE ATORVASTATIN TO 80 MG ONCE DAILY= 2 OF THE 40 MG TABLETS ONCE DAILY  Your physician recommends that you return for lab work in: 12 WEEKS PRIOR TO EATING  Your physician recommends that you schedule a follow-up appointment in: 3 MONTHS WITH DR Jens Som

## 2019-08-07 NOTE — Telephone Encounter (Signed)
Spoke with patient. Patient has chest pain in the middle of his chest only when mowing his yard and it is not constant. He reports the pain does not stop him but it slows him until the pain passes. Patient reports 20 years ago he had a similar pain walking up the hill in his neighborhood and he had to have an LAD stent placed. Patient reports he is able to walk the same hill now without any issues but is concerned that the pain comes when mowing his yard which he has been doing more frequently now. Patient requesting next available appointment with Dr. Jens Som in [place] or [facility]. Patient placed on schedule for 11:20am this morning in [facility]. Patient is on his way to appointment now. Patient is not actively having chest pain.

## 2019-08-07 NOTE — H&P (View-Only) (Signed)
HPI: FU coronary artery disease. He has had a previous PCI of his LAD in 2000. His most recent catheterization performed in August 2005 showed nonobstructive disease. Stress echocardiogram January 2016 showed electrographic changes, chest pain and ischemia in the LAD distribution. I discussed options with him at that time and he was felt to be asymptomatic. He elected medical therapy.Nuclear study August 2018 showed ejection fraction 45%. ECG positive. However no ischemia or infarction. Since I last saw him,in the past week he has had 3 separate episodes of substernal chest pain while pushing his lawn more relieved with rest. No radiation. There is dyspnea but no nausea or diaphoresis. No symptoms with normal activities or at rest.  Current Outpatient Medications  Medication Sig Dispense Refill  . acetaminophen (TYLENOL) 500 MG tablet Take 1,000 mg by mouth as needed.    Marland Kitchen aspirin EC 81 MG tablet Take 1 tablet (81 mg total) by mouth daily.    Marland Kitchen atorvastatin (LIPITOR) 40 MG tablet Take 1 tablet (40 mg total) by mouth daily. 90 tablet 1  . Calcium-Magnesium-Vitamin D (CALCIUM 500 PO) Take 1 tablet by mouth daily.    . Cholecalciferol (VITAMIN D3 PO) Take 2,000 Units by mouth daily.    . DULoxetine (CYMBALTA) 30 MG capsule Take 1 capsule (30 mg total) by mouth daily. 90 capsule 1  . fluticasone (FLONASE) 50 MCG/ACT nasal spray Place 1-2 sprays into both nostrils daily as needed for allergies.     . hydrochlorothiazide (HYDRODIURIL) 25 MG tablet Take 25 mg by mouth daily.    . metoprolol succinate (TOPROL-XL) 25 MG 24 hr tablet TAKE 1 TABLET BY MOUTH EVERY DAY 90 tablet 3  . rOPINIRole (REQUIP) 1 MG tablet TAKE 2 TABLETS (2 MG TOTAL) BY MOUTH AT BEDTIME. 180 tablet 3  . traZODone (DESYREL) 50 MG tablet Take 150 mg by mouth at bedtime.      No current facility-administered medications for this visit.     Past Medical History:  Diagnosis Date  . CAD   . HIATAL HERNIA   .  HYPERLIPIDEMIA   . HYPERTENSION     Past Surgical History:  Procedure Laterality Date  . CARDIAC CATHETERIZATION    . NOSE SURGERY    . TRANSURETHRAL RESECTION OF PROSTATE  03/2017    Social History   Socioeconomic History  . Marital status: Married    Spouse name: Not on file  . Number of children: Not on file  . Years of education: Not on file  . Highest education level: Not on file  Occupational History  . Not on file  Tobacco Use  . Smoking status: Never Smoker  . Smokeless tobacco: Never Used  Substance and Sexual Activity  . Alcohol use: No    Alcohol/week: 0.0 standard drinks  . Drug use: No  . Sexual activity: Not on file  Other Topics Concern  . Not on file  Social History Narrative   Lives   Caffeine use:    Social Determinants of Health   Financial Resource Strain:   . Difficulty of Paying Living Expenses:   Food Insecurity:   . Worried About Programme researcher, broadcasting/film/video in the Last Year:   . Barista in the Last Year:   Transportation Needs:   . Freight forwarder (Medical):   Marland Kitchen Lack of Transportation (Non-Medical):   Physical Activity:   . Days of Exercise per Week:   . Minutes of Exercise per Session:  Stress:   . Feeling of Stress :   Social Connections:   . Frequency of Communication with Friends and Family:   . Frequency of Social Gatherings with Friends and Family:   . Attends Religious Services:   . Active Member of Clubs or Organizations:   . Attends Club or Organization Meetings:   . Marital Status:   Intimate Partner Violence:   . Fear of Current or Ex-Partner:   . Emotionally Abused:   . Physically Abused:   . Sexually Abused:     Family History  Problem Relation Age of Onset  . Alzheimer's disease Mother        SOME TYPE OF VALUE DISORDER  . Heart attack Father   . Hypertension Father   . Obesity Father   . Hypertension Sister   . Diabetes Sister   . Obesity Sister   . Other Son        NO HEALTH PROBLEMS  . Other  Daughter        NO HEALTH PROBLEMS    ROS: no fevers or chills, productive cough, hemoptysis, dysphasia, odynophagia, melena, hematochezia, dysuria, hematuria, rash, seizure activity, orthopnea, PND, pedal edema, claudication. Remaining systems are negative.  Physical Exam: Well-developed well-nourished in no acute distress.  Skin is warm and dry.  HEENT is normal.  Neck is supple.  Chest is clear to auscultation with normal expansion.  Cardiovascular exam is regular rate and rhythm.  Abdominal exam nontender or distended. No masses palpated. Extremities show no edema. neuro grossly intact  ECG-sinus bradycardia with no ST changes.  Personally reviewed  A/P  1 chest pain-patient has symptoms that are consistent with recurrent angina. He has had no rest symptoms. We will continue with present medications including metoprolol, aspirin and statin. Plan cardiac catheterization. The risks and benefits including myocardial infarction, CVA and death discussed and he agrees to proceed.  2 coronary artery disease- Plan to continue aspirin and statin.  3 hypertension-blood pressure controlled.  Continue present medical regimen.  4 hyperlipidemia-continue Lipitor but will increase to 80 mg daily. Check lipids and liver in 12 weeks.  Morgan Rennert, MD    

## 2019-08-08 ENCOUNTER — Telehealth: Payer: Self-pay | Admitting: Cardiology

## 2019-08-08 LAB — CBC
Hematocrit: 38.1 % (ref 37.5–51.0)
Hemoglobin: 12.9 g/dL — ABNORMAL LOW (ref 13.0–17.7)
MCH: 31.6 pg (ref 26.6–33.0)
MCHC: 33.9 g/dL (ref 31.5–35.7)
MCV: 93 fL (ref 79–97)
Platelets: 182 10*3/uL (ref 150–450)
RBC: 4.08 x10E6/uL — ABNORMAL LOW (ref 4.14–5.80)
RDW: 12.3 % (ref 11.6–15.4)
WBC: 7.5 10*3/uL (ref 3.4–10.8)

## 2019-08-08 LAB — BASIC METABOLIC PANEL
BUN/Creatinine Ratio: 20 (ref 10–24)
BUN: 18 mg/dL (ref 8–27)
CO2: 26 mmol/L (ref 20–29)
Calcium: 8.7 mg/dL (ref 8.6–10.2)
Chloride: 101 mmol/L (ref 96–106)
Creatinine, Ser: 0.91 mg/dL (ref 0.76–1.27)
GFR calc Af Amer: 96 mL/min/{1.73_m2} (ref >59)
GFR calc non Af Amer: 83 mL/min/{1.73_m2} (ref >59)
Glucose: 94 mg/dL (ref 65–99)
Potassium: 4.5 mmol/L (ref 3.5–5.2)
Sodium: 141 mmol/L (ref 134–144)

## 2019-08-08 NOTE — Telephone Encounter (Signed)
Spoke with pt, states he knows that his labs need to be drawn in 12 weeks but his appt with Dr.Crenshaw is in 12 weeks, wants to know when he should have the labs drawn. Notified pt that he could have them drawn 2-3 days prior to visit and we should have the results for his office visit. Told him to have them fax the results to [place] and if they have issues they can contact our office. Reminded pt that the labs are supposed to be fasting as well. Pt verbalized understanding for labs. No other questions at this time.

## 2019-08-08 NOTE — Telephone Encounter (Signed)
New Message:      Pt wants to know when does he need his lab work before his appt on 10-30-19 with Dr Jens Som?

## 2019-08-10 ENCOUNTER — Other Ambulatory Visit (HOSPITAL_COMMUNITY)
Admission: RE | Admit: 2019-08-10 | Discharge: 2019-08-10 | Disposition: A | Discharge status: Home / Self Care | Payer: Medicare HMO | Source: Ambulatory Visit | Attending: Cardiovascular Disease | Admitting: Cardiovascular Disease

## 2019-08-10 DIAGNOSIS — Z01812 Encounter for preprocedural laboratory examination: Secondary | ICD-10-CM | POA: Insufficient documentation

## 2019-08-10 DIAGNOSIS — Z20822 Contact with and (suspected) exposure to covid-19: Secondary | ICD-10-CM | POA: Diagnosis not present

## 2019-08-10 LAB — SARS CORONAVIRUS 2 (TAT 6-24 HRS): SARS Coronavirus 2: NEGATIVE

## 2019-08-13 ENCOUNTER — Telehealth: Payer: Self-pay | Admitting: *Deleted

## 2019-08-13 NOTE — Telephone Encounter (Signed)
Pt contacted pre-catheterization scheduled at [facility] for: Wednesday August 14, 2019 8:30 AM Verified arrival time and place: [facility] Main Entrance A [facility]) at: 6:30 AM   No solid food after midnight prior to cath, clear liquids until 5 AM day of procedure.  Hold: HCTZ-AM of procedure   Except hold medications AM meds can be  taken pre-cath with sip of water including: ASA 81 mg   Confirmed patient has responsible adult to drive home post procedure and observe 24 hours after arriving home: yes  Currently, due to Covid-19 pandemic, only one person will be allowed with patient. Must be the same person for patient's entire stay and will be required to wear a mask. They will be asked to wait in the waiting room for the duration of the patient's stay.  Patients are required to wear a mask when they enter the hospital.      COVID-19 Pre-Screening Questions:  . In the past 7 to 10 days have you had a cough,  shortness of breath, headache, congestion, fever (100 or greater) body aches, chills, sore throat, or sudden loss of taste or sense of smell? no . Have you been around anyone with known Covid 19 in the past 7-10 days? no . Have you been around anyone who is awaiting Covid 19 test results in the past 7 to 10 days? no . Have you been around anyone who has mentioned symptoms of Covid 19 within the past 7 to 10 days? no    Reviewed procedure/mask/visitor instructions,COVID-19 screening questions with patient.

## 2019-08-14 ENCOUNTER — Other Ambulatory Visit: Payer: Self-pay

## 2019-08-14 ENCOUNTER — Ambulatory Visit (HOSPITAL_COMMUNITY)
Admission: RE | Admit: 2019-08-14 | Discharge: 2019-08-14 | Disposition: A | Discharge status: Home / Self Care | Payer: Medicare HMO | Source: Ambulatory Visit | Attending: Cardiovascular Disease | Admitting: Cardiovascular Disease

## 2019-08-14 ENCOUNTER — Encounter (HOSPITAL_COMMUNITY)
Admission: RE | Disposition: A | Discharge status: Home / Self Care | Payer: Medicare HMO | Source: Ambulatory Visit | Attending: Cardiovascular Disease

## 2019-08-14 DIAGNOSIS — Z888 Allergy status to other drugs, medicaments and biological substances status: Secondary | ICD-10-CM | POA: Diagnosis not present

## 2019-08-14 DIAGNOSIS — R072 Precordial pain: Secondary | ICD-10-CM

## 2019-08-14 DIAGNOSIS — I10 Essential (primary) hypertension: Secondary | ICD-10-CM | POA: Diagnosis not present

## 2019-08-14 DIAGNOSIS — I200 Unstable angina: Secondary | ICD-10-CM

## 2019-08-14 DIAGNOSIS — I2584 Coronary atherosclerosis due to calcified coronary lesion: Secondary | ICD-10-CM | POA: Insufficient documentation

## 2019-08-14 DIAGNOSIS — Z7982 Long term (current) use of aspirin: Secondary | ICD-10-CM | POA: Diagnosis not present

## 2019-08-14 DIAGNOSIS — Z8249 Family history of ischemic heart disease and other diseases of the circulatory system: Secondary | ICD-10-CM | POA: Insufficient documentation

## 2019-08-14 DIAGNOSIS — I25110 Atherosclerotic heart disease of native coronary artery with unstable angina pectoris: Secondary | ICD-10-CM | POA: Diagnosis not present

## 2019-08-14 DIAGNOSIS — E785 Hyperlipidemia, unspecified: Secondary | ICD-10-CM | POA: Insufficient documentation

## 2019-08-14 DIAGNOSIS — Z79899 Other long term (current) drug therapy: Secondary | ICD-10-CM | POA: Diagnosis not present

## 2019-08-14 DIAGNOSIS — Z95820 Peripheral vascular angioplasty status with implants and grafts: Secondary | ICD-10-CM

## 2019-08-14 HISTORY — PX: LEFT HEART CATH AND CORONARY ANGIOGRAPHY: CATH118249

## 2019-08-14 HISTORY — PX: CORONARY STENT INTERVENTION: CATH118234

## 2019-08-14 LAB — POCT ACTIVATED CLOTTING TIME
Activated Clotting Time: 235 seconds
Activated Clotting Time: 351 seconds
Activated Clotting Time: 252 seconds

## 2019-08-14 SURGERY — LEFT HEART CATH AND CORONARY ANGIOGRAPHY
Anesthesia: LOCAL

## 2019-08-14 MED ORDER — MIDAZOLAM HCL 2 MG/2ML IJ SOLN
INTRAMUSCULAR | Status: AC
  Filled 2019-08-14: qty 2

## 2019-08-14 MED ORDER — ATORVASTATIN CALCIUM 80 MG PO TABS: 80.0000 mg | ORAL_TABLET | Freq: Every day | ORAL | Status: DC

## 2019-08-14 MED ORDER — LIDOCAINE HCL (PF) 1 % IJ SOLN
INTRAMUSCULAR | Status: DC | PRN
  Administered 2019-08-14: 2 mL

## 2019-08-14 MED ORDER — CLOPIDOGREL BISULFATE 300 MG PO TABS
ORAL_TABLET | ORAL | Status: AC
  Filled 2019-08-14: qty 1

## 2019-08-14 MED ORDER — HYDROCHLOROTHIAZIDE 25 MG PO TABS: 25.0000 mg | ORAL_TABLET | Freq: Every day | ORAL | Status: DC

## 2019-08-14 MED ORDER — SODIUM CHLORIDE 0.9 % IV SOLN: 250.0000 mL | INTRAVENOUS | Status: DC | PRN

## 2019-08-14 MED ORDER — HEPARIN (PORCINE) IN NACL 1000-0.9 UT/500ML-% IV SOLN
INTRAVENOUS | Status: DC | PRN
  Administered 2019-08-14 (×2): 500 mL

## 2019-08-14 MED ORDER — IOHEXOL 350 MG/ML SOLN
INTRAVENOUS | Status: DC | PRN
  Administered 2019-08-14: 10:00:00 180 mL

## 2019-08-14 MED ORDER — VERAPAMIL HCL 2.5 MG/ML IV SOLN
INTRAVENOUS | Status: DC | PRN
  Administered 2019-08-14: 10 mL via INTRA_ARTERIAL

## 2019-08-14 MED ORDER — FENTANYL CITRATE (PF) 100 MCG/2ML IJ SOLN
INTRAMUSCULAR | Status: AC
  Filled 2019-08-14: qty 2

## 2019-08-14 MED ORDER — NITROGLYCERIN 0.4 MG SL SUBL: 0.4000 mg | SUBLINGUAL_TABLET | SUBLINGUAL | 2 refills | Status: DC | PRN

## 2019-08-14 MED ORDER — HEPARIN (PORCINE) IN NACL 1000-0.9 UT/500ML-% IV SOLN
INTRAVENOUS | Status: AC
  Filled 2019-08-14: qty 500

## 2019-08-14 MED ORDER — SODIUM CHLORIDE 0.9 % WEIGHT BASED INFUSION
3.0000 mL/kg/h | INTRAVENOUS | Status: DC
  Administered 2019-08-14: 3 mL/kg/h via INTRAVENOUS

## 2019-08-14 MED ORDER — FAMOTIDINE IN NACL 20-0.9 MG/50ML-% IV SOLN
INTRAVENOUS | Status: AC
  Filled 2019-08-14: qty 50

## 2019-08-14 MED ORDER — ACETAMINOPHEN 325 MG PO TABS: 650.0000 mg | ORAL_TABLET | ORAL | Status: DC | PRN

## 2019-08-14 MED ORDER — FENTANYL CITRATE (PF) 100 MCG/2ML IJ SOLN
INTRAMUSCULAR | Status: DC | PRN
  Administered 2019-08-14: 50 ug via INTRAVENOUS
  Administered 2019-08-14: 25 ug via INTRAVENOUS

## 2019-08-14 MED ORDER — CLOPIDOGREL BISULFATE 75 MG PO TABS: 75.0000 mg | ORAL_TABLET | Freq: Every day | ORAL | 1 refills | Status: DC

## 2019-08-14 MED ORDER — SODIUM CHLORIDE 0.9 % WEIGHT BASED INFUSION: 1.0000 mL/kg/h | INTRAVENOUS | Status: DC

## 2019-08-14 MED ORDER — HEPARIN SODIUM (PORCINE) 1000 UNIT/ML IJ SOLN
INTRAMUSCULAR | Status: DC | PRN
  Administered 2019-08-14: 5000 [IU] via INTRAVENOUS
  Administered 2019-08-14 (×2): 4000 [IU] via INTRAVENOUS

## 2019-08-14 MED ORDER — MIDAZOLAM HCL 2 MG/2ML IJ SOLN
INTRAMUSCULAR | Status: DC | PRN
  Administered 2019-08-14 (×2): 1 mg via INTRAVENOUS

## 2019-08-14 MED ORDER — ONDANSETRON HCL 4 MG/2ML IJ SOLN: 4.0000 mg | Freq: Four times a day (QID) | INTRAMUSCULAR | Status: DC | PRN

## 2019-08-14 MED ORDER — FAMOTIDINE IN NACL 20-0.9 MG/50ML-% IV SOLN
INTRAVENOUS | Status: AC | PRN
  Administered 2019-08-14: 20 mg via INTRAVENOUS

## 2019-08-14 MED ORDER — SODIUM CHLORIDE 0.9 % IV SOLN: INTRAVENOUS | Status: AC

## 2019-08-14 MED ORDER — HYDRALAZINE HCL 20 MG/ML IJ SOLN: 10.0000 mg | INTRAMUSCULAR | Status: AC | PRN

## 2019-08-14 MED ORDER — CLOPIDOGREL BISULFATE 75 MG PO TABS: 75.0000 mg | ORAL_TABLET | Freq: Every day | ORAL | Status: DC

## 2019-08-14 MED ORDER — CLOPIDOGREL BISULFATE 300 MG PO TABS
ORAL_TABLET | ORAL | Status: DC | PRN
  Administered 2019-08-14: 600 mg via ORAL

## 2019-08-14 MED ORDER — ASPIRIN 81 MG PO CHEW: 81.0000 mg | CHEWABLE_TABLET | ORAL | Status: DC

## 2019-08-14 MED ORDER — METOPROLOL SUCCINATE ER 25 MG PO TB24: 25.0000 mg | ORAL_TABLET | Freq: Every day | ORAL | Status: DC

## 2019-08-14 MED ORDER — HEPARIN SODIUM (PORCINE) 1000 UNIT/ML IJ SOLN
INTRAMUSCULAR | Status: AC
  Filled 2019-08-14: qty 1

## 2019-08-14 MED ORDER — VERAPAMIL HCL 2.5 MG/ML IV SOLN
INTRAVENOUS | Status: AC
  Filled 2019-08-14: qty 2

## 2019-08-14 MED ORDER — LIDOCAINE HCL (PF) 1 % IJ SOLN
INTRAMUSCULAR | Status: AC
  Filled 2019-08-14: qty 30

## 2019-08-14 MED ORDER — ASPIRIN EC 81 MG PO TBEC: 81.0000 mg | DELAYED_RELEASE_TABLET | Freq: Every day | ORAL | Status: DC

## 2019-08-14 MED ORDER — SODIUM CHLORIDE 0.9% FLUSH: 3.0000 mL | Freq: Two times a day (BID) | INTRAVENOUS | Status: DC

## 2019-08-14 MED ORDER — SODIUM CHLORIDE 0.9% FLUSH: 3.0000 mL | INTRAVENOUS | Status: DC | PRN

## 2019-08-14 MED ORDER — ROPINIROLE HCL 1 MG PO TABS: 2.0000 mg | ORAL_TABLET | Freq: Every day | ORAL | Status: DC

## 2019-08-14 MED ORDER — DULOXETINE HCL 30 MG PO CPEP: 30.0000 mg | ORAL_CAPSULE | Freq: Every day | ORAL | Status: DC

## 2019-08-14 MED ORDER — LABETALOL HCL 5 MG/ML IV SOLN: 10.0000 mg | INTRAVENOUS | Status: AC | PRN

## 2019-08-14 MED FILL — CLOPIDOGREL 75 MG TABLET: 75 | 30 days supply | Qty: 30 | Fill #0

## 2019-08-14 MED FILL — NITROGLYCERIN 0.4 MG TAB SL: 0.4 | 8 days supply | Qty: 25 | Fill #0

## 2019-08-14 SURGICAL SUPPLY — 26 items
BALLN SAPPHIRE 3.0X12 (BALLOONS) ×2
BALLN SAPPHIRE ~~LOC~~ 3.25X12 (BALLOONS) ×1 IMPLANT
BALLN SAPPHIRE ~~LOC~~ 3.75X12 (BALLOONS) ×1 IMPLANT
BALLN ~~LOC~~ EMERGE MR 2.0X12 (BALLOONS) ×2
BALLOON SAPPHIRE 3.0X12 (BALLOONS) IMPLANT
BALLOON ~~LOC~~ EMERGE MR 2.0X12 (BALLOONS) IMPLANT
CATH 5FR JL3.5 JR4 ANG PIG MP (CATHETERS) ×1 IMPLANT
CATH SHOCKWAVE 3.5X12 (CATHETERS) IMPLANT
CATH VISTA GUIDE 6FR XBLAD3.5 (CATHETERS) ×1 IMPLANT
CATHETER SHOCKWAVE 3.5X12 (CATHETERS) ×2
DEVICE RAD COMP TR BAND LRG (VASCULAR PRODUCTS) ×1 IMPLANT
GLIDESHEATH SLEND SS 6F .021 (SHEATH) ×1 IMPLANT
GUIDELINER 6F (CATHETERS) ×1 IMPLANT
GUIDEWIRE INQWIRE 1.5J.035X260 (WIRE) IMPLANT
INQWIRE 1.5J .035X260CM (WIRE) ×2
KIT ENCORE 26 ADVANTAGE (KITS) ×1 IMPLANT
KIT HEART LEFT (KITS) ×2 IMPLANT
PACK CARDIAC CATHETERIZATION (CUSTOM PROCEDURE TRAY) ×2 IMPLANT
STENT RESOLUTE ONYX 3.0X12 (Permanent Stent) IMPLANT
STENT RESOLUTE ONYX 3.0X15 (Permanent Stent) IMPLANT
STENT RESOLUTE ONYX 3.0X18 (Permanent Stent) ×1 IMPLANT
STENT RESOLUTE ONYX 3.5X15 (Permanent Stent) ×1 IMPLANT
SYR MEDRAD MARK 7 150ML (SYRINGE) ×2 IMPLANT
TRANSDUCER W/STOPCOCK (MISCELLANEOUS) ×2 IMPLANT
TUBING CIL FLEX 10 FLL-RA (TUBING) ×2 IMPLANT
WIRE COUGAR XT STRL 190CM (WIRE) ×1 IMPLANT

## 2019-08-14 NOTE — Progress Notes (Signed)
[id number] Education completed with pt who voiced understanding. Stressed importance of plavix with stent. Reviewed NTG use, heart healthy food choices, walking for ex, and CRP 2. Pt likes to walk for ex. Will refer to [place] CRP 2. Luetta Nutting RN BSN 08/14/2019 11:09 AM

## 2019-08-14 NOTE — Interval H&P Note (Signed)
History and Physical Interval Note:  08/14/2019 7:48 AM  Jeremy Simon  has presented today for surgery, with the diagnosis of chest pain.  The various methods of treatment have been discussed with the patient and family. After consideration of risks, benefits and other options for treatment, the patient has consented to  Procedure(s): LEFT HEART CATH AND CORONARY ANGIOGRAPHY (N/A) as a surgical intervention.  The patient's history has been reviewed, patient examined, no change in status, stable for surgery.  I have reviewed the patient's chart and labs.  Questions were answered to the patient's satisfaction.    Cath Lab Visit (complete for each Cath Lab visit)  Clinical Evaluation Leading to the Procedure:   ACS: No.  Non-ACS:    Anginal Classification: CCS III  Anti-ischemic medical therapy: Minimal Therapy (1 class of medications)  Non-Invasive Test Results: No non-invasive testing performed  Prior CABG: No previous CABG        Verne Carrow

## 2019-08-14 NOTE — Discharge Summary (Signed)
Discharge Summary for Same Day PCI   Patient ID: LUGENE BEOUGHER MRN: [id number]; DOB: May 30, 1945  Admit date: 08/14/2019 Discharge date: 08/14/2019  Primary Care Provider: Buckner Malta, MD  Primary Cardiologist: Olga Millers, MD  Primary Electrophysiologist:  None   Discharge Diagnoses    Active Problems:   Unstable angina [facility])   Diagnostic Studies/Procedures    Cardiac Catheterization 08/14/2019:   Prox RCA to Mid RCA lesion is 20% stenosed.  3rd Mrg lesion is 100% stenosed.  Ost Cx to Mid Cx lesion is 40% stenosed.  Mid LAD lesion is 20% stenosed.  Ost LAD to Prox LAD lesion is 99% stenosed.  Prox LAD to Mid LAD lesion is 60% stenosed.  A drug-eluting stent was successfully placed using a STENT RESOLUTE ONYX 3.0X18.  Post intervention, there is a 0% residual stenosis.  A drug-eluting stent was successfully placed using a STENT RESOLUTE ONYX 3.5X15.  Post intervention, there is a 0% residual stenosis.  The left ventricular systolic function is normal.  LV end diastolic pressure is normal.  The left ventricular ejection fraction is 55-65% by visual estimate.  There is no mitral valve regurgitation.   1. Severe, heavily calcified ostial LAD stenosis. Moderate, calcified disease in the proximal/mid LAD. Patent mid stent with minimal restenosis.  2. The Circumflex has mild diffuse disease. The most distal obtuse marginal branch is chronically occluded, small in caliber and fills from left to left collaterals.  3. The RCA is a large dominant vessel with mild plaque.  4. Normal LV systolic function 5. Successful PTCA/Intracoronary lithotripsy (Shockwave balloon), placement 2 drug eluting stents proximal/mid LAD  Recommendations: DAPT with ASA and Plavix for at least six months but longer if tolerated.   Diagnostic Dominance: Right  Intervention    _____________   History of Present Illness     Jeremy Simon is a 74 y.o. male with PCI of his  LAD in 2000. His most recent catheterization performed in August 2005 showed nonobstructive disease. Stress echocardiogram January 2016 showed electrographic changes, chest pain and ischemia in the LAD distribution. Dr. Jens Som discussed options with him at that time and he was felt to be asymptomatic. He elected for medical therapy.Nuclear study August 2018 showed ejection fraction 45%. ECG positive. However no ischemia or infarction. Since he was last seen in the office he reported 3 separate episodes of substernal chest pain while pushing his lawn more relieved with rest within a week. No radiation. There was dyspnea but no nausea or diaphoresis. No symptoms with normal activities or at rest. Options were discussed at this office visit.  Outpatient cardiac catheterization was arranged for further evaluation.  Hospital Course     The patient underwent cardiac cath as noted above with successful PTCA/Intracoronary lithotripsy (Shockwave balloon), placement 2 drug eluting stents proximal/mid LAD. Plan for DAPT with ASA/plavix for at least 6 months. The patient was seen by cardiac rehab while in short stay. There were no observed complications post cath. Radial cath site was re-evaluated prior to discharge and found to be stable without any complications.   Daymon Larsen was seen by Dr. Clifton James and determined stable for discharge home. Follow up with our office has been arranged. Instructions/precautions regarding cath site care were given prior to discharge. Medications are listed below. Pertinent changes include addition of plavix. ____________  Did the patient have an acute coronary syndrome (MI, NSTEMI, STEMI, etc) this admission?:  No  Did the patient have a percutaneous coronary intervention (stent / angioplasty)?:  Yes.     Cath/PCI Registry Performance & Quality Measures: 1. Aspirin prescribed? - Yes 2. ADP Receptor Inhibitor (Plavix/Clopidogrel,  Brilinta/Ticagrelor or Effient/Prasugrel) prescribed (includes medically managed patients)? - Yes 3. High Intensity Statin (Lipitor 40-80mg  or Crestor 20-40mg ) prescribed? - Yes 4. For EF <40%, was ACEI/ARB prescribed? - Not Applicable (EF >/= 08%) 5. For EF <40%, Aldosterone Antagonist (Spironolactone or Eplerenone) prescribed? - Not Applicable (EF >/= 67%) 6. Cardiac Rehab Phase II ordered (Included Medically managed Patients)? - Yes   _____________   Discharge Vitals Blood pressure (!) 118/53, pulse (!) 41, temperature 98.3 F (36.8 C), temperature source Skin, resp. rate (!) 6, height 6' 0.5" (1.842 m), weight 76.2 kg, SpO2 96 %.  Filed Weights   08/14/19 0703  Weight: 76.2 kg    Last Labs & Radiologic Studies    CBC No results for input(s): WBC, NEUTROABS, HGB, HCT, MCV, PLT in the last 72 hours. Basic Metabolic Panel No results for input(s): NA, K, CL, CO2, GLUCOSE, BUN, CREATININE, CALCIUM, MG, PHOS in the last 72 hours. Liver Function Tests No results for input(s): AST, ALT, ALKPHOS, BILITOT, PROT, ALBUMIN in the last 72 hours. No results for input(s): LIPASE, AMYLASE in the last 72 hours. High Sensitivity Troponin:   No results for input(s): TROPONINIHS in the last 720 hours.  BNP Invalid input(s): POCBNP D-Dimer No results for input(s): DDIMER in the last 72 hours. Hemoglobin A1C No results for input(s): HGBA1C in the last 72 hours. Fasting Lipid Panel No results for input(s): CHOL, HDL, LDLCALC, TRIG, CHOLHDL, LDLDIRECT in the last 72 hours. Thyroid Function Tests No results for input(s): TSH, T4TOTAL, T3FREE, THYROIDAB in the last 72 hours.  Invalid input(s): FREET3 _____________  CARDIAC CATHETERIZATION  Result Date: 08/14/2019  Prox RCA to Mid RCA lesion is 20% stenosed.  3rd Mrg lesion is 100% stenosed.  Ost Cx to Mid Cx lesion is 40% stenosed.  Mid LAD lesion is 20% stenosed.  Ost LAD to Prox LAD lesion is 99% stenosed.  Prox LAD to Mid LAD lesion is  60% stenosed.  A drug-eluting stent was successfully placed using a STENT RESOLUTE ONYX 3.0X18.  Post intervention, there is a 0% residual stenosis.  A drug-eluting stent was successfully placed using a STENT RESOLUTE ONYX 3.5X15.  Post intervention, there is a 0% residual stenosis.  The left ventricular systolic function is normal.  LV end diastolic pressure is normal.  The left ventricular ejection fraction is 55-65% by visual estimate.  There is no mitral valve regurgitation.  1. Severe, heavily calcified ostial LAD stenosis. Moderate, calcified disease in the proximal/mid LAD. Patent mid stent with minimal restenosis. 2. The Circumflex has mild diffuse disease. The most distal obtuse marginal branch is chronically occluded, small in caliber and fills from left to left collaterals. 3. The RCA is a large dominant vessel with mild plaque. 4. Normal LV systolic function 5. Successful PTCA/Intracoronary lithotripsy (Shockwave balloon), placement 2 drug eluting stents proximal/mid LAD Recommendations: DAPT with ASA and Plavix for at least six months but longer if tolerated.    Disposition   Pt is being discharged home today in good condition.  Follow-up Plans & Appointments    Follow-up Information    Deberah Pelton, NP Follow up on 08/23/2019.   Specialty: Cardiology Why: at 11:45am for your follow up appt.  Contact information: [address] [place] [address] [phone]  Discharge Instructions    Amb Referral to Cardiac Rehabilitation   Complete by: As directed    Referral to [place] [facility] 2   Diagnosis: Coronary Stents   After initial evaluation and assessments completed: Virtual Based Care may be provided alone or in conjunction with Phase 2 Cardiac Rehab based on patient barriers.: Yes       Discharge Medications   Allergies as of 08/14/2019      Reactions   Altace [ramipril]    Hives, lip swelling   Norvasc [amlodipine] Swelling   Left foot  swelling      Medication List    TAKE these medications   acetaminophen 500 MG tablet Commonly known as: TYLENOL Take 1,000 mg by mouth every 6 (six) hours as needed for mild pain or moderate pain.   aspirin EC 81 MG tablet Take 1 tablet (81 mg total) by mouth daily.   atorvastatin 80 MG tablet Commonly known as: LIPITOR Take 1 tablet (80 mg total) by mouth daily. What changed: when to take this   CALCIUM 500 PO Take 1 tablet by mouth daily.   clopidogrel 75 MG tablet Commonly known as: PLAVIX Take 1 tablet (75 mg total) by mouth daily with breakfast. Start taking on: August 15, 2019   DULoxetine 30 MG capsule Commonly known as: Cymbalta Take 1 capsule (30 mg total) by mouth daily. What changed: when to take this   fluticasone 50 MCG/ACT nasal spray Commonly known as: FLONASE Place 1-2 sprays into both nostrils daily as needed for allergies.   hydrochlorothiazide 25 MG tablet Commonly known as: HYDRODIURIL Take 25 mg by mouth daily.   metoprolol succinate 25 MG 24 hr tablet Commonly known as: TOPROL-XL TAKE 1 TABLET BY MOUTH EVERY DAY What changed: how much to take   nitroGLYCERIN 0.4 MG SL tablet Commonly known as: Nitrostat Place 1 tablet (0.4 mg total) under the tongue every 5 (five) minutes as needed.   rOPINIRole 1 MG tablet Commonly known as: REQUIP TAKE 2 TABLETS (2 MG TOTAL) BY MOUTH AT BEDTIME.   traZODone 50 MG tablet Commonly known as: DESYREL Take 100 mg by mouth at bedtime.   VITAMIN D3 PO Take 2,000 Units by mouth daily.       Allergies Allergies  Allergen Reactions  . Altace [Ramipril]     Hives, lip swelling  . Norvasc [Amlodipine] Swelling    Left foot swelling    Outstanding Labs/Studies   N/a   Duration of Discharge Encounter   Greater than 30 minutes including physician time.  Signed, Laverda Page, NP 08/14/2019, 2:45 PM

## 2019-08-14 NOTE — Discharge Instructions (Signed)
Radial Site Care ° °This sheet gives you information about how to care for yourself after your procedure. Your health care provider may also give you more specific instructions. If you have problems or questions, contact your health care provider. °What can I expect after the procedure? °After the procedure, it is common to have: °· Bruising and tenderness at the catheter insertion area. °Follow these instructions at home: °Medicines °· Take over-the-counter and prescription medicines only as told by your health care provider. °Insertion site care °· Follow instructions from your health care provider about how to take care of your insertion site. Make sure you: °? Wash your hands with soap and water before you change your bandage (dressing). If soap and water are not available, use hand sanitizer. °? Change your dressing as told by your health care provider. °? Leave stitches (sutures), skin glue, or adhesive strips in place. These skin closures may need to stay in place for 2 weeks or longer. If adhesive strip edges start to loosen and curl up, you may trim the loose edges. Do not remove adhesive strips completely unless your health care provider tells you to do that. °· Check your insertion site every day for signs of infection. Check for: °? Redness, swelling, or pain. °? Fluid or blood. °? Pus or a bad smell. °? Warmth. °· Do not take baths, swim, or use a hot tub until your health care provider approves. °· You may shower 24-48 hours after the procedure, or as directed by your health care provider. °? Remove the dressing and gently wash the site with plain soap and water. °? Pat the area dry with a clean towel. °? Do not rub the site. That could cause bleeding. °· Do not apply powder or lotion to the site. °Activity ° °· For 24 hours after the procedure, or as directed by your health care provider: °? Do not flex or bend the affected arm. °? Do not push or pull heavy objects with the affected arm. °? Do not  drive yourself home from the hospital or clinic. You may drive 24 hours after the procedure unless your health care provider tells you not to. °? Do not operate machinery or power tools. °· Do not lift anything that is heavier than 10 lb (4.5 kg), or the limit that you are told, until your health care provider says that it is safe. °· Ask your health care provider when it is okay to: °? Return to work or school. °? Resume usual physical activities or sports. °? Resume sexual activity. °General instructions °· If the catheter site starts to bleed, raise your arm and put firm pressure on the site. If the bleeding does not stop, get help right away. This is a medical emergency. °· If you went home on the same day as your procedure, a responsible adult should be with you for the first 24 hours after you arrive home. °· Keep all follow-up visits as told by your health care provider. This is important. °Contact a health care provider if: °· You have a fever. °· You have redness, swelling, or yellow drainage around your insertion site. °Get help right away if: °· You have unusual pain at the radial site. °· The catheter insertion area swells very fast. °· The insertion area is bleeding, and the bleeding does not stop when you hold steady pressure on the area. °· Your arm or hand becomes pale, cool, tingly, or numb. °These symptoms may represent a serious problem   that is an emergency. Do not wait to see if the symptoms will go away. Get medical help right away. Call your local emergency services (911 in the [place] Do not drive yourself to the hospital. Summary  After the procedure, it is common to have bruising and tenderness at the site.  Follow instructions from your health care provider about how to take care of your radial site wound. Check the wound every day for signs of infection.  Do not lift anything that is heavier than 10 lb (4.5 kg), or the limit that you are told, until your health care provider says  that it is safe. This information is not intended to replace advice given to you by your health care provider. Make sure you discuss any questions you have with your health care provider. Document Revised: 05/31/2017 Document Reviewed: 05/31/2017 Elsevier Patient Education  2020 [employer]   Coronary Angiogram With Stent, Care After This sheet gives you information about how to care for yourself after your procedure. Your health care provider may also give you more specific instructions. If you have problems or questions, contact your health care provider. What can I expect after the procedure? After the procedure, it is common to have:  Bruising and tenderness at the insertion site. This usually fades within 1-2 weeks.  A collection of blood under the skin (hematoma). This usually decreases within 1-2 weeks. Follow these instructions at home: Medicines  Take over-the-counter and prescription medicines only as told by your health care provider.  If you were prescribed an antibiotic medicine, take it as told by your health care provider. Do not stop using the antibiotic even if you start to feel better.  If you take medicines for diabetes, your health care provider may need to change how much you take. Ask your health care provider for specific directions about taking your diabetes medicines.  If you are taking blood thinners: ? Talk with your health care provider before you take any medicines that contain aspirin or NSAIDs, such as ibuprofen. These medicines increase your risk for dangerous bleeding. ? Take your medicine exactly as told, at the same time every day. ? Avoid activities that could cause injury or bruising, and follow instructions about how to prevent falls. ? Wear a medical alert bracelet or carry a card that lists what medicines you take. Eating and drinking   Follow instructions from your health care provider about eating or drinking restrictions.  Eat a  heart-healthy diet that includes plenty of fresh fruits and vegetables.  Avoid foods that are high in salt, sugar, or saturated fat. Avoid fried foods or canned or highly processed food.  Drink enough fluid to keep your urine pale yellow. Alcohol use  Do not drink alcohol if: ? Your health care provider tells you not to. ? You are pregnant, may be pregnant, or plan to become pregnant.  If you drink alcohol: ? Limit how much you use to:  0-1 drink a day for women.  0-2 drinks a day for men. ? Be aware of how much alcohol is in your drink. In the [place] one drink equals one 12 oz bottle of beer (355 mL), one 5 oz glass of wine (148 mL), or one 1 oz glass of hard liquor (44 mL). Bathing  Do not take baths, swim, or use a hot tub until your health care provider approves. Ask your health care provider if you may take showers. You may only be allowed to take sponge baths.  Gently wash the insertion site with plain soap and water.  Pat the area dry with a clean towel. Do not rub. This may cause bleeding. Incision care  Follow instructions from your health care provider about how to take care of your insertion area. Make sure you: ? Wash your hands with soap and water before and after you change your bandage (dressing). If soap and water are not available, use hand sanitizer. ? Change your dressing as told by your health care provider. ? Leave stitches (sutures) or adhesive strips in place. These skin closures may need to stay in place for 2 weeks or longer. If adhesive strip edges start to loosen and curl up, you may trim the loose edges. Do not remove adhesive strips completely unless your health care provider tells you to do that.  Do not apply powder or lotion on the insertion area.  Check your insertion area every day for signs of infection. Check for: ? Redness, swelling, or pain. ? Fluid or blood. ? Warmth. ? Pus or a bad smell. Activity  Do not drive for 24 hours if you were  given a sedative during your procedure.  Rest as told by your health care provider. ? Avoid sitting for a long time without moving. Get up to take short walks every 1-2 hours. This is important to improve blood flow and breathing. Ask for help if you feel weak or unsteady.  Do not lift anything that is heavier than 10 lb (4.5 kg), or the limit that you are told, until your health care provider says that it is safe.  Return to your normal activities as told by your health care provider. Ask your health care provider what activities are safe for you. Lifestyle   Do not use any products that contain nicotine or tobacco, such as cigarettes, e-cigarettes, and chewing tobacco. If you need help quitting, ask your health care provider.  If needed, work with your health care provider to treat other problems, such as being overweight, or having high blood pressure or diabetes.  Get regular exercise. Do exercises as told by your health care provider. General instructions  Tell all your health care providers that you have a stent. This is especially important if you are going to get imaging studies, such as MRI.  Wear compression stockings as told by your health care provider. These stockings help to prevent blood clots and reduce swelling in your legs.  Do not strain during a bowel movement if the procedure was done through your leg. Straining may cause bleeding from the insertion site.  Keep all follow-up visits as directed by your health care provider. This is important. Contact a health care provider if you:  Have a fever.  Have chills.  Have redness, swelling, or pain around your insertion area.  Have fluid or blood (other than a little blood on the dressing) coming from your insertion area.  Notice that your insertion area feels warm to the touch.  Have pus or a bad smell coming from your insertion area.  Have more bleeding from the insertion area. Hold pressure on the area. Get  help right away if:  You develop chest pain or shortness of breath.  You feel like fainting or you faint.  Your leg or arm becomes cool, numb, or tingly.  You have unusual pain.  Your insertion area is bleeding, and bleeding continues after 30 minutes of steadily held pressure.  You develop bleeding anywhere else, including from your rectum. There  may be bright red blood in your urine or stool, or you may have black, tarry stool. These symptoms may represent a serious problem that is an emergency. Do not wait to see if the symptoms will go away. Get medical help right away. Call your local emergency services (911 in the [place] Do not drive yourself to the hospital. Summary  After this procedure, it is common to have bruising and tenderness around the catheter insertion site. This will go away in a few weeks.  Follow your health care provider's instructions about caring for your insertion site. Change dressing and clean the area as instructed.  Eat a heart-healthy diet. Limit alcohol use. Do not use tobacco or nicotine.  Contact a health care provider if you have fever or chills, or if you have pus or a bad smell coming from the site.  Get help right away if you develop chest pain, you faint, or have bleeding at the insertion site. This information is not intended to replace advice given to you by your health care provider. Make sure you discuss any questions you have with your health care provider. Document Revised: 11/14/2018 Document Reviewed: 11/14/2018 Elsevier Patient Education  [place].  Clopidogrel tablets What is this medicine? CLOPIDOGREL (kloh PID oh grel) helps to prevent blood clots. This medicine is used to prevent heart attack, stroke, or other vascular events in people who are at high risk. This medicine may be used for other purposes; ask your health care provider or pharmacist if you have questions. COMMON BRAND NAME(S): Plavix What should I tell my  health care provider before I take this medicine? They need to know if you have any of the following conditions:  bleeding disorders  bleeding in the brain  having surgery  history of stomach bleeding  an unusual or allergic reaction to clopidogrel, other medicines, foods, dyes, or preservatives  pregnant or trying to get pregnant  breast-feeding How should I use this medicine? Take this medicine by mouth with a glass of water. Follow the directions on the prescription label. You may take this medicine with or without food. If it upsets your stomach, take it with food. Take your medicine at regular intervals. Do not take it more often than directed. Do not stop taking except on your doctor's advice. A special MedGuide will be given to you by the pharmacist with each prescription and refill. Be sure to read this information carefully each time. Talk to your pediatrician regarding the use of this medicine in children. Special care may be needed. Overdosage: If you think you have taken too much of this medicine contact a poison control center or emergency room at once. NOTE: This medicine is only for you. Do not share this medicine with others. What if I miss a dose? If you miss a dose, take it as soon as you can. If it is almost time for your next dose, take only that dose. Do not take double or extra doses. What may interact with this medicine? Do not take this medicine with the following medications:  dasabuvir; ombitasvir; paritaprevir; ritonavir  defibrotide  selexipag This medicine may also interact with the following medications:  certain medicines that treat or prevent blood clots like warfarin  narcotic medicines for pain  NSAIDs, medicines for pain and inflammation, like ibuprofen or naproxen  repaglinide  SNRIs, medicines for depression, like desvenlafaxine, duloxetine, levomilnacipran, venlafaxine  SSRIs, medicines for depression, like citalopram, escitalopram,  fluoxetine, fluvoxamine, paroxetine, sertraline  stomach acid  blockers like cimetidine, esomeprazole, omeprazole This list may not describe all possible interactions. Give your health care provider a list of all the medicines, herbs, non-prescription drugs, or dietary supplements you use. Also tell them if you smoke, drink alcohol, or use illegal drugs. Some items may interact with your medicine. What should I watch for while using this medicine? Visit your doctor or health care professional for regular check-ups. Do not stop taking your medicine unless your doctor tells you to. Notify your doctor or health care professional and seek emergency treatment if you develop breathing problems; changes in vision; chest pain; severe, sudden headache; pain, swelling, warmth in the leg; trouble speaking; sudden numbness or weakness of the face, arm or leg. These can be signs that your condition has gotten worse. If you are going to have surgery or dental work, tell your doctor or health care professional that you are taking this medicine. Certain genetic factors may reduce the effect of this medicine. Your doctor may use genetic tests to determine treatment. Only take aspirin if you are instructed to. Low doses of aspirin are used with this medicine to treat some conditions. Taking aspirin with this medicine can increase your risk of bleeding so you must be careful. Talk to your doctor or pharmacist if you have questions. What side effects may I notice from receiving this medicine? Side effects that you should report to your doctor or health care professional as soon as possible:  allergic reactions like skin rash, itching or hives, swelling of the face, lips, or tongue  signs and symptoms of bleeding such as bloody or black, tarry stools; red or dark-brown urine; spitting up blood or brown material that looks like coffee grounds; red spots on the skin; unusual bruising or bleeding from the eye, gums, or  nose  signs and symptoms of a blood clot such as breathing problems; changes in vision; chest pain; severe, sudden headache; pain, swelling, warmth in the leg; trouble speaking; sudden numbness or weakness of the face, arm or leg  signs and symptoms of low blood sugar such as feeling anxious; confusion; dizziness; increased hunger; unusually weak or tired; increased sweating; shakiness; cold, clammy skin; irritable; headache; blurred vision; fast heartbeat; loss of consciousness Side effects that usually do not require medical attention (report to your doctor or health care professional if they continue or are bothersome):  constipation  diarrhea  headache  upset stomach This list may not describe all possible side effects. Call your doctor for medical advice about side effects. You may report side effects to FDA at 1-800-FDA-1088. Where should I keep my medicine? Keep out of the reach of children. Store at room temperature of 59 to 86 degrees F (15 to 30 degrees C). Throw away any unused medicine after the expiration date. NOTE: This sheet is a summary. It may not cover all possible information. If you have questions about this medicine, talk to your doctor, pharmacist, or health care provider.  2020 Elsevier/[employer] (2017-09-25 15:03:38)

## 2019-08-16 ENCOUNTER — Telehealth (HOSPITAL_COMMUNITY): Payer: Self-pay

## 2019-08-16 NOTE — Telephone Encounter (Signed)
Faxed referral to [facility]. 

## 2019-08-20 DIAGNOSIS — Z9229 Personal history of other drug therapy: Secondary | ICD-10-CM | POA: Diagnosis not present

## 2019-08-20 DIAGNOSIS — Z6822 Body mass index (BMI) 22.0-22.9, adult: Secondary | ICD-10-CM | POA: Diagnosis not present

## 2019-08-20 DIAGNOSIS — I119 Hypertensive heart disease without heart failure: Secondary | ICD-10-CM | POA: Diagnosis not present

## 2019-08-20 DIAGNOSIS — R69 Illness, unspecified: Secondary | ICD-10-CM | POA: Diagnosis not present

## 2019-08-20 DIAGNOSIS — Z955 Presence of coronary angioplasty implant and graft: Secondary | ICD-10-CM | POA: Diagnosis not present

## 2019-08-20 DIAGNOSIS — Z1331 Encounter for screening for depression: Secondary | ICD-10-CM | POA: Diagnosis not present

## 2019-08-20 DIAGNOSIS — G4700 Insomnia, unspecified: Secondary | ICD-10-CM | POA: Diagnosis not present

## 2019-08-20 DIAGNOSIS — I2510 Atherosclerotic heart disease of native coronary artery without angina pectoris: Secondary | ICD-10-CM | POA: Diagnosis not present

## 2019-08-20 DIAGNOSIS — J302 Other seasonal allergic rhinitis: Secondary | ICD-10-CM | POA: Diagnosis not present

## 2019-08-21 DIAGNOSIS — I2510 Atherosclerotic heart disease of native coronary artery without angina pectoris: Secondary | ICD-10-CM | POA: Diagnosis not present

## 2019-08-21 DIAGNOSIS — E785 Hyperlipidemia, unspecified: Secondary | ICD-10-CM | POA: Diagnosis not present

## 2019-08-21 DIAGNOSIS — Z955 Presence of coronary angioplasty implant and graft: Secondary | ICD-10-CM | POA: Diagnosis not present

## 2019-08-23 ENCOUNTER — Ambulatory Visit: Payer: Medicare HMO | Admitting: General Practice

## 2019-08-23 DIAGNOSIS — E785 Hyperlipidemia, unspecified: Secondary | ICD-10-CM | POA: Diagnosis not present

## 2019-08-23 DIAGNOSIS — I2510 Atherosclerotic heart disease of native coronary artery without angina pectoris: Secondary | ICD-10-CM | POA: Diagnosis not present

## 2019-08-23 DIAGNOSIS — Z955 Presence of coronary angioplasty implant and graft: Secondary | ICD-10-CM | POA: Diagnosis not present

## 2019-08-26 DIAGNOSIS — I2510 Atherosclerotic heart disease of native coronary artery without angina pectoris: Secondary | ICD-10-CM | POA: Diagnosis not present

## 2019-08-26 DIAGNOSIS — Z955 Presence of coronary angioplasty implant and graft: Secondary | ICD-10-CM | POA: Diagnosis not present

## 2019-08-26 DIAGNOSIS — E785 Hyperlipidemia, unspecified: Secondary | ICD-10-CM | POA: Diagnosis not present

## 2019-08-28 DIAGNOSIS — I2510 Atherosclerotic heart disease of native coronary artery without angina pectoris: Secondary | ICD-10-CM | POA: Diagnosis not present

## 2019-08-28 DIAGNOSIS — Z955 Presence of coronary angioplasty implant and graft: Secondary | ICD-10-CM | POA: Diagnosis not present

## 2019-08-28 DIAGNOSIS — E785 Hyperlipidemia, unspecified: Secondary | ICD-10-CM | POA: Diagnosis not present

## 2019-08-30 DIAGNOSIS — Z955 Presence of coronary angioplasty implant and graft: Secondary | ICD-10-CM | POA: Diagnosis not present

## 2019-08-30 DIAGNOSIS — I2510 Atherosclerotic heart disease of native coronary artery without angina pectoris: Secondary | ICD-10-CM | POA: Diagnosis not present

## 2019-08-30 DIAGNOSIS — E785 Hyperlipidemia, unspecified: Secondary | ICD-10-CM | POA: Diagnosis not present

## 2019-08-31 ENCOUNTER — Other Ambulatory Visit: Payer: Self-pay | Admitting: Neurology

## 2019-09-02 DIAGNOSIS — E785 Hyperlipidemia, unspecified: Secondary | ICD-10-CM | POA: Diagnosis not present

## 2019-09-02 DIAGNOSIS — Z955 Presence of coronary angioplasty implant and graft: Secondary | ICD-10-CM | POA: Diagnosis not present

## 2019-09-02 DIAGNOSIS — I2510 Atherosclerotic heart disease of native coronary artery without angina pectoris: Secondary | ICD-10-CM | POA: Diagnosis not present

## 2019-09-04 DIAGNOSIS — Z955 Presence of coronary angioplasty implant and graft: Secondary | ICD-10-CM | POA: Diagnosis not present

## 2019-09-04 DIAGNOSIS — I2510 Atherosclerotic heart disease of native coronary artery without angina pectoris: Secondary | ICD-10-CM | POA: Diagnosis not present

## 2019-09-04 DIAGNOSIS — E785 Hyperlipidemia, unspecified: Secondary | ICD-10-CM | POA: Diagnosis not present

## 2019-09-04 NOTE — Progress Notes (Signed)
Cardiology Clinic Note   Patient Name: Jeremy Simon Date of Encounter: 09/05/2019  Primary Care Provider:  Serita Grammes, MD Primary Cardiologist:  Kirk Ruths, MD  Patient Profile    Jeremy Simon 74 year old male presents today for follow-up after his PCI on 08/14/2019 with DES x2 to his proximal and LAD.  Past Medical History    Past Medical History:  Diagnosis Date  . CAD   . HIATAL HERNIA   . HYPERLIPIDEMIA   . HYPERTENSION    Past Surgical History:  Procedure Laterality Date  . CARDIAC CATHETERIZATION    . CORONARY STENT INTERVENTION N/A 08/14/2019   Procedure: CORONARY STENT INTERVENTION;  Surgeon: Burnell Blanks, MD;  Location: [place] [facility];  Service: Cardiovascular;  Laterality: N/A;  . LEFT HEART CATH AND CORONARY ANGIOGRAPHY N/A 08/14/2019   Procedure: LEFT HEART CATH AND CORONARY ANGIOGRAPHY;  Surgeon: Burnell Blanks, MD;  Location: [place] [facility];  Service: Cardiovascular;  Laterality: N/A;  . NOSE SURGERY    . TRANSURETHRAL RESECTION OF PROSTATE  03/2017    Allergies  Allergies  Allergen Reactions  . Altace [Ramipril]     Hives, lip swelling  . Norvasc [Amlodipine] Swelling    Left foot swelling    History of Present Illness  Mr. Glogowski has a PMH of coronary artery disease with a PCI to his LAD in 2000.  Cardiac catheterization 12/2003 showing nonobstructive CAD, echocardiogram 1/16 showed electrographic changes, chest pain and ischemia in LAD distribution.  His symptoms were discussed at that time and he was felt to be asymptomatic.  He elected for medical therapy.  Nuclear stress test 8/18 showed an EF of 45% and a positive EKG.  However no ischemia or infarction were noted.  He was last seen by Dr. Stanford Breed on 08/07/2019.  During that time he expressed 3 episodes of substernal chest pain while pushing his lawnmower which were relieved by rest.  He experienced no radiation.  He did indicate dyspnea but no nausea or  diaphoresis.  He was not having symptoms with normal activities or at rest.  He was scheduled for cardiac catheterization.  His atorvastatin was increased to 80 mg at that time.  EKG 08/15/2019 showed sinus bradycardia 49 bpm.  Underwent same-day  PCI on 08/14/2019.  His cardiac catheterization showed a proximal 99% LAD occlusion and mid 60% LAD occlusion with a patent mid LAD stent, third marginal lesion 100% stenosed, LST circumflex to mid circumflex lesion 40% stenosed, and a proximal RCA to mid RCA lesion of 20%.  LVEF 55-65% with no mitral valve regurgitation.  Received DES x2 to his mid and proximal LAD.  He presents the clinic today for follow-up evaluation and states he feels well today.  He is working with cardiac rehab and has not had any further episodes of chest discomfort.  He does have some slight left ankle swelling.  He is wearing his lower extremity support stockings and states this is helped.  I will order a BMP today, give him salty 6 information sheet, and have him follow-up with Dr. Stanford Breed in 3 months.  Today he  denies chest pain, shortness of breath, lower extremity edema, fatigue, palpitations, melena, hematuria, hemoptysis, diaphoresis, weakness, presyncope, syncope, orthopnea, and PND.  Home Medications    Prior to Admission medications   Medication Sig Start Date End Date Taking? Authorizing Provider  acetaminophen (TYLENOL) 500 MG tablet Take 1,000 mg by mouth every 6 (six) hours as needed for mild pain  or moderate pain.     [provider]  aspirin EC 81 MG tablet Take 1 tablet (81 mg total) by mouth daily. 12/22/17   Lelon Perla, MD  atorvastatin (LIPITOR) 80 MG tablet Take 1 tablet (80 mg total) by mouth daily. Patient taking differently: Take 80 mg by mouth at bedtime.  08/07/19   Lelon Perla, MD  Calcium-Magnesium-Vitamin D (CALCIUM 500 PO) Take 1 tablet by mouth daily.    [provider]  Cholecalciferol (VITAMIN D3 PO) Take 2,000 Units by  mouth daily.    [provider]  clopidogrel (PLAVIX) 75 MG tablet Take 1 tablet (75 mg total) by mouth daily with breakfast. 08/15/19   Reino Bellis B, NP  DULoxetine (CYMBALTA) 30 MG capsule Take 1 capsule (30 mg total) by mouth daily. Patient taking differently: Take 30 mg by mouth daily at 12 noon.  07/15/19   Suzzanne Cloud, NP  fluticasone Asencion Islam) 50 MCG/ACT nasal spray Place 1-2 sprays into both nostrils daily as needed for allergies.  03/25/12   [provider]  hydrochlorothiazide (HYDRODIURIL) 25 MG tablet Take 25 mg by mouth daily.    [provider]  metoprolol succinate (TOPROL-XL) 25 MG 24 hr tablet TAKE 1 TABLET BY MOUTH EVERY DAY Patient taking differently: Take 12.5 mg by mouth daily.  04/05/19   Lelon Perla, MD  nitroGLYCERIN (NITROSTAT) 0.4 MG SL tablet Place 1 tablet (0.4 mg total) under the tongue every 5 (five) minutes as needed. 08/14/19   Cheryln Manly, NP  rOPINIRole (REQUIP) 1 MG tablet TAKE 2 TABLETS (2 MG TOTAL) BY MOUTH AT BEDTIME. 03/15/19   Marcial Pacas, MD  traZODone (DESYREL) 50 MG tablet Take 100 mg by mouth at bedtime.     [provider]    Family History    Family History  Problem Relation Age of Onset  . Alzheimer's disease Mother        SOME TYPE OF VALUE DISORDER  . Heart attack Father   . Hypertension Father   . Obesity Father   . Hypertension Sister   . Diabetes Sister   . Obesity Sister   . Other Son        NO HEALTH PROBLEMS  . Other Daughter        NO HEALTH PROBLEMS   He indicated that his mother is deceased. He indicated that his father is deceased. He indicated that only one of his four sisters is alive. He indicated that his maternal grandmother is deceased. He indicated that his maternal grandfather is deceased. He indicated that his paternal grandmother is deceased. He indicated that his paternal grandfather is deceased. He indicated that only one of his two daughters is alive. He indicated  that only one of his two sons is alive.  Social History    Social History   Socioeconomic History  . Marital status: Married    Spouse name: Not on file  . Number of children: Not on file  . Years of education: Not on file  . Highest education level: Not on file  Occupational History  . Not on file  Tobacco Use  . Smoking status: Never Smoker  . Smokeless tobacco: Never Used  Substance and Sexual Activity  . Alcohol use: No    Alcohol/week: 0.0 standard drinks  . Drug use: No  . Sexual activity: Not on file  Other Topics Concern  . Not on file  Social History Narrative   Lives  Caffeine use:    Social Determinants of Radio broadcast assistant Strain:   . Difficulty of Paying Living Expenses:   Food Insecurity:   . Worried About Charity fundraiser in the Last Year:   . Arboriculturist in the Last Year:   Transportation Needs:   . Film/video editor (Medical):   Marland Kitchen Lack of Transportation (Non-Medical):   Physical Activity:   . Days of Exercise per Week:   . Minutes of Exercise per Session:   Stress:   . Feeling of Stress :   Social Connections:   . Frequency of Communication with Friends and Family:   . Frequency of Social Gatherings with Friends and Family:   . Attends Religious Services:   . Active Member of Clubs or Organizations:   . Attends Archivist Meetings:   Marland Kitchen Marital Status:   Intimate Partner Violence:   . Fear of Current or Ex-Partner:   . Emotionally Abused:   Marland Kitchen Physically Abused:   . Sexually Abused:      Review of Systems    General:  No chills, fever, night sweats or weight changes.  Cardiovascular:  No chest pain, dyspnea on exertion, edema, orthopnea, palpitations, paroxysmal nocturnal dyspnea. Dermatological: No rash, lesions/masses Respiratory: No cough, dyspnea Urologic: No hematuria, dysuria Abdominal:   No nausea, vomiting, diarrhea, bright red blood per rectum, melena, or hematemesis Neurologic:  No visual  changes, wkns, changes in mental status. All other systems reviewed and are otherwise negative except as noted above.  Physical Exam    VS:  BP 118/62   Pulse (!) 59   Ht 6' 0.5" (1.842 m)   Wt 169 lb (76.7 kg)   BMI 22.61 kg/m  , BMI Body mass index is 22.61 kg/m. GEN: Well nourished, well developed, in no acute distress. HEENT: normal. Neck: Supple, no JVD, carotid bruits, or masses. Cardiac: RRR, no murmurs, rubs, or gallops. No clubbing, cyanosis, slight left ankle edema.  Radials/DP/PT 2+ and equal bilaterally.  Respiratory:  Respirations regular and unlabored, clear to auscultation bilaterally. GI: Soft, nontender, nondistended, BS + x 4. MS: no deformity or atrophy. Skin: warm and dry, no rash. Neuro:  Strength and sensation are intact. Psych: Normal affect.  Accessory Clinical Findings    ECG personally reviewed by me today-sinus bradycardia 59 bpm no ST or T wave deviation-no acute changes.  EKG 08/15/2019 Sinus bradycardia 49 bpm no ST or T wave deviation  Cardiac catheterization 08/14/2019   Prox RCA to Mid RCA lesion is 20% stenosed.  3rd Mrg lesion is 100% stenosed.  Ost Cx to Mid Cx lesion is 40% stenosed.  Mid LAD lesion is 20% stenosed.  Ost LAD to Prox LAD lesion is 99% stenosed.  Prox LAD to Mid LAD lesion is 60% stenosed.  A drug-eluting stent was successfully placed using a STENT RESOLUTE ONYX 3.0X18.  Post intervention, there is a 0% residual stenosis.  A drug-eluting stent was successfully placed using a STENT RESOLUTE ONYX 3.5X15.  Post intervention, there is a 0% residual stenosis.  The left ventricular systolic function is normal.  LV end diastolic pressure is normal.  The left ventricular ejection fraction is 55-65% by visual estimate.  There is no mitral valve regurgitation.   1. Severe, heavily calcified ostial LAD stenosis. Moderate, calcified disease in the proximal/mid LAD. Patent mid stent with minimal restenosis.  2. The  Circumflex has mild diffuse disease. The most distal obtuse marginal branch is chronically occluded,  small in caliber and fills from left to left collaterals.  3. The RCA is a large dominant vessel with mild plaque.  4. Normal LV systolic function 5. Successful PTCA/Intracoronary lithotripsy (Shockwave balloon), placement 2 drug eluting stents proximal/mid LAD  Recommendations: DAPT with ASA and Plavix for at least six months but longer if tolerated.   Diagnostic Dominance: Right  Intervention     Assessment & Plan   1.  Chest pain/coronary artery disease -no chest pain today.  No further episodes of angina.  Same-day PCI on 08/14/2019 DES x2 to proximal and mid LAD.  EKG today shows sinus bradycardia 59 bpm no ST or T wave deviation. Continue current medical therapy Heart healthy low-sodium diet Increase physical activity as tolerated Order BMP  Essential hypertension-BP today 118/62.  Well-controlled at home. Continue current medical therapy Heart healthy low-sodium diet-salty 6 given Increase physical activity as tolerated  Hyperlipidemia-Lipitor increased to 80 mg daily on 08/07/2019. Repeat lipid panel and LFTs in 8 weeks.   Disposition: Follow-up with Dr. Stanford Breed in 3 months.   Jossie Ng. Jahni Paul NP-C    09/05/2019, 10:42 AM [place] [address] [address] [address] Office [phone] Fax [phone]

## 2019-09-05 ENCOUNTER — Encounter: Payer: Self-pay | Admitting: General Practice

## 2019-09-05 ENCOUNTER — Ambulatory Visit: Payer: Medicare HMO | Admitting: General Practice

## 2019-09-05 ENCOUNTER — Other Ambulatory Visit: Payer: Self-pay

## 2019-09-05 VITALS — BP 118/62 | HR 59 | Ht 72.5 in | Wt 169.0 lb

## 2019-09-05 DIAGNOSIS — E7800 Pure hypercholesterolemia, unspecified: Secondary | ICD-10-CM | POA: Diagnosis not present

## 2019-09-05 DIAGNOSIS — I2510 Atherosclerotic heart disease of native coronary artery without angina pectoris: Secondary | ICD-10-CM

## 2019-09-05 DIAGNOSIS — I10 Essential (primary) hypertension: Secondary | ICD-10-CM

## 2019-09-05 LAB — BASIC METABOLIC PANEL
BUN/Creatinine Ratio: 19 (ref 10–24)
BUN: 16 mg/dL (ref 8–27)
CO2: 25 mmol/L (ref 20–29)
Calcium: 8.9 mg/dL (ref 8.6–10.2)
Chloride: 104 mmol/L (ref 96–106)
Creatinine, Ser: 0.85 mg/dL (ref 0.76–1.27)
GFR calc Af Amer: 100 mL/min/{1.73_m2} (ref >59)
GFR calc non Af Amer: 86 mL/min/{1.73_m2} (ref >59)
Glucose: 95 mg/dL (ref 65–99)
Potassium: 4.9 mmol/L (ref 3.5–5.2)
Sodium: 141 mmol/L (ref 134–144)

## 2019-09-05 NOTE — Patient Instructions (Signed)
Medication Instructions:  Your physician recommends that you continue on your current medications as directed. Please refer to the Current Medication list given to you today.  *If you need a refill on your cardiac medications before your next appointment, please call your pharmacy*   Lab Work: Your physician recommends that you return for lab work today: BMET  Your physician recommends that you return for a FASTING lipid profile and liver function panel in 8 weeks (June).   If you have labs (blood work) drawn today and your tests are completely normal, you will receive your results only by: Marland Kitchen MyChart Message (if you have MyChart) OR . A paper copy in the mail If you have any lab test that is abnormal or we need to change your treatment, we will call you to review the results.   Follow-Up: At [facility], you and your health needs are our priority.  As part of our continuing mission to provide you with exceptional heart care, we have created designated Provider Care Teams.  These Care Teams include your primary Cardiologist (physician) and Advanced Practice Providers (APPs -  Physician Assistants and Nurse Practitioners) who all work together to provide you with the care you need, when you need it.  We recommend signing up for the patient portal called "MyChart".  Sign up information is provided on this After Visit Summary.  MyChart is used to connect with patients for Virtual Visits (Telemedicine).  Patients are able to view lab/test results, encounter notes, upcoming appointments, etc.  Non-urgent messages can be sent to your provider as well.   To learn more about what you can do with MyChart, go to [email or web address].    Your next appointment:   3 month(s)  The format for your next appointment:   In Person  Provider:   Olga Millers, MD   Other Instructions You have been cleared for physical activity as tolerated.   Please review the salty six sheet given to you  today.

## 2019-09-06 DIAGNOSIS — I2510 Atherosclerotic heart disease of native coronary artery without angina pectoris: Secondary | ICD-10-CM | POA: Diagnosis not present

## 2019-09-06 DIAGNOSIS — E785 Hyperlipidemia, unspecified: Secondary | ICD-10-CM | POA: Diagnosis not present

## 2019-09-06 DIAGNOSIS — Z955 Presence of coronary angioplasty implant and graft: Secondary | ICD-10-CM | POA: Diagnosis not present

## 2019-09-09 DIAGNOSIS — Z95820 Peripheral vascular angioplasty status with implants and grafts: Secondary | ICD-10-CM | POA: Diagnosis not present

## 2019-09-09 DIAGNOSIS — I2510 Atherosclerotic heart disease of native coronary artery without angina pectoris: Secondary | ICD-10-CM | POA: Diagnosis not present

## 2019-09-09 DIAGNOSIS — E785 Hyperlipidemia, unspecified: Secondary | ICD-10-CM | POA: Diagnosis not present

## 2019-09-09 DIAGNOSIS — Z955 Presence of coronary angioplasty implant and graft: Secondary | ICD-10-CM | POA: Diagnosis not present

## 2019-09-11 DIAGNOSIS — E785 Hyperlipidemia, unspecified: Secondary | ICD-10-CM | POA: Diagnosis not present

## 2019-09-11 DIAGNOSIS — Z95820 Peripheral vascular angioplasty status with implants and grafts: Secondary | ICD-10-CM | POA: Diagnosis not present

## 2019-09-11 DIAGNOSIS — I2510 Atherosclerotic heart disease of native coronary artery without angina pectoris: Secondary | ICD-10-CM | POA: Diagnosis not present

## 2019-09-11 DIAGNOSIS — Z955 Presence of coronary angioplasty implant and graft: Secondary | ICD-10-CM | POA: Diagnosis not present

## 2019-09-13 DIAGNOSIS — Z95820 Peripheral vascular angioplasty status with implants and grafts: Secondary | ICD-10-CM | POA: Diagnosis not present

## 2019-09-13 DIAGNOSIS — Z955 Presence of coronary angioplasty implant and graft: Secondary | ICD-10-CM | POA: Diagnosis not present

## 2019-09-13 DIAGNOSIS — I2510 Atherosclerotic heart disease of native coronary artery without angina pectoris: Secondary | ICD-10-CM | POA: Diagnosis not present

## 2019-09-13 DIAGNOSIS — E785 Hyperlipidemia, unspecified: Secondary | ICD-10-CM | POA: Diagnosis not present

## 2019-09-16 DIAGNOSIS — Z955 Presence of coronary angioplasty implant and graft: Secondary | ICD-10-CM | POA: Diagnosis not present

## 2019-09-16 DIAGNOSIS — E785 Hyperlipidemia, unspecified: Secondary | ICD-10-CM | POA: Diagnosis not present

## 2019-09-16 DIAGNOSIS — I2510 Atherosclerotic heart disease of native coronary artery without angina pectoris: Secondary | ICD-10-CM | POA: Diagnosis not present

## 2019-09-16 DIAGNOSIS — Z95820 Peripheral vascular angioplasty status with implants and grafts: Secondary | ICD-10-CM | POA: Diagnosis not present

## 2019-09-18 DIAGNOSIS — I2510 Atherosclerotic heart disease of native coronary artery without angina pectoris: Secondary | ICD-10-CM | POA: Diagnosis not present

## 2019-09-18 DIAGNOSIS — Z955 Presence of coronary angioplasty implant and graft: Secondary | ICD-10-CM | POA: Diagnosis not present

## 2019-09-18 DIAGNOSIS — E785 Hyperlipidemia, unspecified: Secondary | ICD-10-CM | POA: Diagnosis not present

## 2019-09-18 DIAGNOSIS — Z95820 Peripheral vascular angioplasty status with implants and grafts: Secondary | ICD-10-CM | POA: Diagnosis not present

## 2019-09-20 DIAGNOSIS — Z95820 Peripheral vascular angioplasty status with implants and grafts: Secondary | ICD-10-CM | POA: Diagnosis not present

## 2019-09-20 DIAGNOSIS — Z955 Presence of coronary angioplasty implant and graft: Secondary | ICD-10-CM | POA: Diagnosis not present

## 2019-09-20 DIAGNOSIS — I2510 Atherosclerotic heart disease of native coronary artery without angina pectoris: Secondary | ICD-10-CM | POA: Diagnosis not present

## 2019-09-20 DIAGNOSIS — E785 Hyperlipidemia, unspecified: Secondary | ICD-10-CM | POA: Diagnosis not present

## 2019-09-23 DIAGNOSIS — Z95820 Peripheral vascular angioplasty status with implants and grafts: Secondary | ICD-10-CM | POA: Diagnosis not present

## 2019-09-23 DIAGNOSIS — E785 Hyperlipidemia, unspecified: Secondary | ICD-10-CM | POA: Diagnosis not present

## 2019-09-23 DIAGNOSIS — I2510 Atherosclerotic heart disease of native coronary artery without angina pectoris: Secondary | ICD-10-CM | POA: Diagnosis not present

## 2019-09-23 DIAGNOSIS — G2581 Restless legs syndrome: Secondary | ICD-10-CM | POA: Diagnosis not present

## 2019-09-23 DIAGNOSIS — J302 Other seasonal allergic rhinitis: Secondary | ICD-10-CM | POA: Diagnosis not present

## 2019-09-23 DIAGNOSIS — G4700 Insomnia, unspecified: Secondary | ICD-10-CM | POA: Diagnosis not present

## 2019-09-23 DIAGNOSIS — I10 Essential (primary) hypertension: Secondary | ICD-10-CM | POA: Diagnosis not present

## 2019-09-23 DIAGNOSIS — Z955 Presence of coronary angioplasty implant and graft: Secondary | ICD-10-CM | POA: Diagnosis not present

## 2019-09-23 DIAGNOSIS — R69 Illness, unspecified: Secondary | ICD-10-CM | POA: Diagnosis not present

## 2019-09-23 DIAGNOSIS — Z1159 Encounter for screening for other viral diseases: Secondary | ICD-10-CM | POA: Diagnosis not present

## 2019-09-23 DIAGNOSIS — Z6822 Body mass index (BMI) 22.0-22.9, adult: Secondary | ICD-10-CM | POA: Diagnosis not present

## 2019-09-25 DIAGNOSIS — E785 Hyperlipidemia, unspecified: Secondary | ICD-10-CM | POA: Diagnosis not present

## 2019-09-25 DIAGNOSIS — Z955 Presence of coronary angioplasty implant and graft: Secondary | ICD-10-CM | POA: Diagnosis not present

## 2019-09-25 DIAGNOSIS — Z95820 Peripheral vascular angioplasty status with implants and grafts: Secondary | ICD-10-CM | POA: Diagnosis not present

## 2019-09-25 DIAGNOSIS — I2510 Atherosclerotic heart disease of native coronary artery without angina pectoris: Secondary | ICD-10-CM | POA: Diagnosis not present

## 2019-09-27 DIAGNOSIS — Z95820 Peripheral vascular angioplasty status with implants and grafts: Secondary | ICD-10-CM | POA: Diagnosis not present

## 2019-09-27 DIAGNOSIS — E785 Hyperlipidemia, unspecified: Secondary | ICD-10-CM | POA: Diagnosis not present

## 2019-09-27 DIAGNOSIS — Z955 Presence of coronary angioplasty implant and graft: Secondary | ICD-10-CM | POA: Diagnosis not present

## 2019-09-27 DIAGNOSIS — I2510 Atherosclerotic heart disease of native coronary artery without angina pectoris: Secondary | ICD-10-CM | POA: Diagnosis not present

## 2019-09-30 DIAGNOSIS — Z95820 Peripheral vascular angioplasty status with implants and grafts: Secondary | ICD-10-CM | POA: Diagnosis not present

## 2019-09-30 DIAGNOSIS — E785 Hyperlipidemia, unspecified: Secondary | ICD-10-CM | POA: Diagnosis not present

## 2019-09-30 DIAGNOSIS — I2510 Atherosclerotic heart disease of native coronary artery without angina pectoris: Secondary | ICD-10-CM | POA: Diagnosis not present

## 2019-09-30 DIAGNOSIS — Z955 Presence of coronary angioplasty implant and graft: Secondary | ICD-10-CM | POA: Diagnosis not present

## 2019-10-02 DIAGNOSIS — Z95820 Peripheral vascular angioplasty status with implants and grafts: Secondary | ICD-10-CM | POA: Diagnosis not present

## 2019-10-02 DIAGNOSIS — Z955 Presence of coronary angioplasty implant and graft: Secondary | ICD-10-CM | POA: Diagnosis not present

## 2019-10-02 DIAGNOSIS — E785 Hyperlipidemia, unspecified: Secondary | ICD-10-CM | POA: Diagnosis not present

## 2019-10-02 DIAGNOSIS — I2510 Atherosclerotic heart disease of native coronary artery without angina pectoris: Secondary | ICD-10-CM | POA: Diagnosis not present

## 2019-10-04 DIAGNOSIS — Z95820 Peripheral vascular angioplasty status with implants and grafts: Secondary | ICD-10-CM | POA: Diagnosis not present

## 2019-10-04 DIAGNOSIS — Z955 Presence of coronary angioplasty implant and graft: Secondary | ICD-10-CM | POA: Diagnosis not present

## 2019-10-04 DIAGNOSIS — E785 Hyperlipidemia, unspecified: Secondary | ICD-10-CM | POA: Diagnosis not present

## 2019-10-04 DIAGNOSIS — I2510 Atherosclerotic heart disease of native coronary artery without angina pectoris: Secondary | ICD-10-CM | POA: Diagnosis not present

## 2019-10-08 DIAGNOSIS — I2510 Atherosclerotic heart disease of native coronary artery without angina pectoris: Secondary | ICD-10-CM | POA: Diagnosis not present

## 2019-10-08 DIAGNOSIS — Z95820 Peripheral vascular angioplasty status with implants and grafts: Secondary | ICD-10-CM | POA: Diagnosis not present

## 2019-10-08 DIAGNOSIS — Z955 Presence of coronary angioplasty implant and graft: Secondary | ICD-10-CM | POA: Diagnosis not present

## 2019-10-08 DIAGNOSIS — E785 Hyperlipidemia, unspecified: Secondary | ICD-10-CM | POA: Diagnosis not present

## 2019-10-09 ENCOUNTER — Encounter: Payer: Self-pay | Admitting: Cardiology

## 2019-10-09 DIAGNOSIS — E785 Hyperlipidemia, unspecified: Secondary | ICD-10-CM | POA: Diagnosis not present

## 2019-10-09 DIAGNOSIS — Z95820 Peripheral vascular angioplasty status with implants and grafts: Secondary | ICD-10-CM | POA: Diagnosis not present

## 2019-10-09 DIAGNOSIS — Z955 Presence of coronary angioplasty implant and graft: Secondary | ICD-10-CM | POA: Diagnosis not present

## 2019-10-09 DIAGNOSIS — I2510 Atherosclerotic heart disease of native coronary artery without angina pectoris: Secondary | ICD-10-CM | POA: Diagnosis not present

## 2019-10-09 NOTE — Telephone Encounter (Signed)
error 

## 2019-10-11 DIAGNOSIS — I2510 Atherosclerotic heart disease of native coronary artery without angina pectoris: Secondary | ICD-10-CM | POA: Diagnosis not present

## 2019-10-11 DIAGNOSIS — Z955 Presence of coronary angioplasty implant and graft: Secondary | ICD-10-CM | POA: Diagnosis not present

## 2019-10-11 DIAGNOSIS — Z95820 Peripheral vascular angioplasty status with implants and grafts: Secondary | ICD-10-CM | POA: Diagnosis not present

## 2019-10-11 DIAGNOSIS — E785 Hyperlipidemia, unspecified: Secondary | ICD-10-CM | POA: Diagnosis not present

## 2019-10-14 DIAGNOSIS — E785 Hyperlipidemia, unspecified: Secondary | ICD-10-CM | POA: Diagnosis not present

## 2019-10-14 DIAGNOSIS — Z95820 Peripheral vascular angioplasty status with implants and grafts: Secondary | ICD-10-CM | POA: Diagnosis not present

## 2019-10-14 DIAGNOSIS — I2510 Atherosclerotic heart disease of native coronary artery without angina pectoris: Secondary | ICD-10-CM | POA: Diagnosis not present

## 2019-10-14 DIAGNOSIS — Z955 Presence of coronary angioplasty implant and graft: Secondary | ICD-10-CM | POA: Diagnosis not present

## 2019-10-15 DIAGNOSIS — R69 Illness, unspecified: Secondary | ICD-10-CM | POA: Diagnosis not present

## 2019-10-16 DIAGNOSIS — Z95820 Peripheral vascular angioplasty status with implants and grafts: Secondary | ICD-10-CM | POA: Diagnosis not present

## 2019-10-16 DIAGNOSIS — E785 Hyperlipidemia, unspecified: Secondary | ICD-10-CM | POA: Diagnosis not present

## 2019-10-16 DIAGNOSIS — Z955 Presence of coronary angioplasty implant and graft: Secondary | ICD-10-CM | POA: Diagnosis not present

## 2019-10-16 DIAGNOSIS — I2510 Atherosclerotic heart disease of native coronary artery without angina pectoris: Secondary | ICD-10-CM | POA: Diagnosis not present

## 2019-10-18 DIAGNOSIS — Z95820 Peripheral vascular angioplasty status with implants and grafts: Secondary | ICD-10-CM | POA: Diagnosis not present

## 2019-10-18 DIAGNOSIS — Z955 Presence of coronary angioplasty implant and graft: Secondary | ICD-10-CM | POA: Diagnosis not present

## 2019-10-18 DIAGNOSIS — E785 Hyperlipidemia, unspecified: Secondary | ICD-10-CM | POA: Diagnosis not present

## 2019-10-18 DIAGNOSIS — I2510 Atherosclerotic heart disease of native coronary artery without angina pectoris: Secondary | ICD-10-CM | POA: Diagnosis not present

## 2019-10-21 DIAGNOSIS — Z955 Presence of coronary angioplasty implant and graft: Secondary | ICD-10-CM | POA: Diagnosis not present

## 2019-10-21 DIAGNOSIS — I2510 Atherosclerotic heart disease of native coronary artery without angina pectoris: Secondary | ICD-10-CM | POA: Diagnosis not present

## 2019-10-21 DIAGNOSIS — E785 Hyperlipidemia, unspecified: Secondary | ICD-10-CM | POA: Diagnosis not present

## 2019-10-21 DIAGNOSIS — Z95820 Peripheral vascular angioplasty status with implants and grafts: Secondary | ICD-10-CM | POA: Diagnosis not present

## 2019-10-23 DIAGNOSIS — I2510 Atherosclerotic heart disease of native coronary artery without angina pectoris: Secondary | ICD-10-CM | POA: Diagnosis not present

## 2019-10-23 DIAGNOSIS — E785 Hyperlipidemia, unspecified: Secondary | ICD-10-CM | POA: Diagnosis not present

## 2019-10-23 DIAGNOSIS — Z955 Presence of coronary angioplasty implant and graft: Secondary | ICD-10-CM | POA: Diagnosis not present

## 2019-10-23 DIAGNOSIS — Z95820 Peripheral vascular angioplasty status with implants and grafts: Secondary | ICD-10-CM | POA: Diagnosis not present

## 2019-10-24 NOTE — Progress Notes (Signed)
HPI: FU coronary artery disease. He has had a previous PCI of his LAD in 2000. Cath 4/21 showed 100 OM, 99 ostial to proximal LAD followed by 60; and normal LV function; had PCI of LAD with DES. Since I last saw him,there is no chest pain, palpitations, dyspnea or syncope.  Current Outpatient Medications  Medication Sig Dispense Refill  . acetaminophen (TYLENOL) 500 MG tablet Take 1,000 mg by mouth every 6 (six) hours as needed for mild pain or moderate pain.     Marland Kitchen aspirin EC 81 MG tablet Take 1 tablet (81 mg total) by mouth daily.    Marland Kitchen atorvastatin (LIPITOR) 80 MG tablet Take 1 tablet (80 mg total) by mouth daily. (Patient taking differently: Take 80 mg by mouth at bedtime. ) 90 tablet 3  . Calcium-Magnesium-Vitamin D (CALCIUM 500 PO) Take 1 tablet by mouth daily.    . Cholecalciferol (VITAMIN D3 PO) Take 4,000 mg by mouth. Patient take 4000 uints at noon    . clopidogrel (PLAVIX) 75 MG tablet Take 1 tablet (75 mg total) by mouth daily with breakfast. 90 tablet 1  . DULoxetine (CYMBALTA) 60 MG capsule Take 60 mg by mouth daily.    . fluticasone (FLONASE) 50 MCG/ACT nasal spray Place 1-2 sprays into both nostrils daily as needed for allergies.     . metoprolol succinate (TOPROL-XL) 25 MG 24 hr tablet TAKE 1 TABLET BY MOUTH EVERY DAY (Patient taking differently: Take 12.5 mg by mouth daily. ) 90 tablet 3  . nitroGLYCERIN (NITROSTAT) 0.4 MG SL tablet Place 1 tablet (0.4 mg total) under the tongue every 5 (five) minutes as needed. 25 tablet 2  . rOPINIRole (REQUIP) 1 MG tablet TAKE 2 TABLETS (2 MG TOTAL) BY MOUTH AT BEDTIME. 180 tablet 3  . traZODone (DESYREL) 50 MG tablet Take 50 mg by mouth at bedtime.     No current facility-administered medications for this visit.     Past Medical History:  Diagnosis Date  . CAD   . HIATAL HERNIA   . HYPERLIPIDEMIA   . HYPERTENSION     Past Surgical History:  Procedure Laterality Date  . CARDIAC CATHETERIZATION    . CORONARY STENT  INTERVENTION N/A 08/14/2019   Procedure: CORONARY STENT INTERVENTION;  Surgeon: Kathleene Hazel, MD;  Location: [facility];  Service: Cardiovascular;  Laterality: N/A;  . LEFT HEART CATH AND CORONARY ANGIOGRAPHY N/A 08/14/2019   Procedure: LEFT HEART CATH AND CORONARY ANGIOGRAPHY;  Surgeon: Kathleene Hazel, MD;  Location: [facility];  Service: Cardiovascular;  Laterality: N/A;  . NOSE SURGERY    . TRANSURETHRAL RESECTION OF PROSTATE  03/2017    Social History   Socioeconomic History  . Marital status: Married    Spouse name: Not on file  . Number of children: Not on file  . Years of education: Not on file  . Highest education level: Not on file  Occupational History  . Not on file  Tobacco Use  . Smoking status: Never Smoker  . Smokeless tobacco: Never Used  Vaping Use  . Vaping Use: Never used  Substance and Sexual Activity  . Alcohol use: No    Alcohol/week: 0.0 standard drinks  . Drug use: No  . Sexual activity: Not on file  Other Topics Concern  . Not on file  Social History Narrative   Lives   Caffeine use:    Social Determinants of Health   Financial Resource Strain:   .  Difficulty of Paying Living Expenses:   Food Insecurity:   . Worried About Charity fundraiser in the Last Year:   . Arboriculturist in the Last Year:   Transportation Needs:   . Film/video editor (Medical):   Marland Kitchen Lack of Transportation (Non-Medical):   Physical Activity:   . Days of Exercise per Week:   . Minutes of Exercise per Session:   Stress:   . Feeling of Stress :   Social Connections:   . Frequency of Communication with Friends and Family:   . Frequency of Social Gatherings with Friends and Family:   . Attends Religious Services:   . Active Member of Clubs or Organizations:   . Attends Archivist Meetings:   Marland Kitchen Marital Status:   Intimate Partner Violence:   . Fear of Current or Ex-Partner:   . Emotionally Abused:   Marland Kitchen Physically Abused:     . Sexually Abused:     Family History  Problem Relation Age of Onset  . Alzheimer's disease Mother        SOME TYPE OF VALUE DISORDER  . Heart attack Father   . Hypertension Father   . Obesity Father   . Hypertension Sister   . Diabetes Sister   . Obesity Sister   . Other Son        NO HEALTH PROBLEMS  . Other Daughter        NO HEALTH PROBLEMS    ROS: no fevers or chills, productive cough, hemoptysis, dysphasia, odynophagia, melena, hematochezia, dysuria, hematuria, rash, seizure activity, orthopnea, PND, pedal edema, claudication. Remaining systems are negative.  Physical Exam: Well-developed well-nourished in no acute distress.  Skin is warm and dry.  HEENT is normal.  Neck is supple.  Chest is clear to auscultation with normal expansion.  Cardiovascular exam is regular rate and rhythm.  Abdominal exam nontender or distended. No masses palpated. Extremities show no edema. neuro grossly intact   A/P  1 CAD-s/p recent PCI of LAD; continue ASA, pavix and statin; no recurrent symptoms.  2 hypertension-BP controlled; continue present meds and follow.  3 hyperlipidemia-continue lipitor.  Kirk Ruths, MD

## 2019-10-25 DIAGNOSIS — Z95820 Peripheral vascular angioplasty status with implants and grafts: Secondary | ICD-10-CM | POA: Diagnosis not present

## 2019-10-25 DIAGNOSIS — E785 Hyperlipidemia, unspecified: Secondary | ICD-10-CM | POA: Diagnosis not present

## 2019-10-25 DIAGNOSIS — Z955 Presence of coronary angioplasty implant and graft: Secondary | ICD-10-CM | POA: Diagnosis not present

## 2019-10-25 DIAGNOSIS — I2510 Atherosclerotic heart disease of native coronary artery without angina pectoris: Secondary | ICD-10-CM | POA: Diagnosis not present

## 2019-10-28 DIAGNOSIS — I2510 Atherosclerotic heart disease of native coronary artery without angina pectoris: Secondary | ICD-10-CM | POA: Diagnosis not present

## 2019-10-28 DIAGNOSIS — E7800 Pure hypercholesterolemia, unspecified: Secondary | ICD-10-CM | POA: Diagnosis not present

## 2019-10-28 DIAGNOSIS — Z955 Presence of coronary angioplasty implant and graft: Secondary | ICD-10-CM | POA: Diagnosis not present

## 2019-10-28 DIAGNOSIS — Z95820 Peripheral vascular angioplasty status with implants and grafts: Secondary | ICD-10-CM | POA: Diagnosis not present

## 2019-10-28 DIAGNOSIS — E785 Hyperlipidemia, unspecified: Secondary | ICD-10-CM | POA: Diagnosis not present

## 2019-10-29 ENCOUNTER — Encounter: Payer: Self-pay | Admitting: Cardiology

## 2019-10-29 LAB — HEPATIC FUNCTION PANEL
ALT: 20 IU/L (ref 0–44)
AST: 23 IU/L (ref 0–40)
Albumin: 4.2 g/dL (ref 3.7–4.7)
Alkaline Phosphatase: 74 IU/L (ref 48–121)
Bilirubin Total: 0.8 mg/dL (ref 0.0–1.2)
Bilirubin, Direct: 0.21 mg/dL (ref 0.00–0.40)
Total Protein: 6.1 g/dL (ref 6.0–8.5)

## 2019-10-29 LAB — LIPID PANEL
Chol/HDL Ratio: 2.5 ratio (ref 0.0–5.0)
Cholesterol, Total: 134 mg/dL (ref 100–199)
HDL: 53 mg/dL (ref >39)
LDL Chol Calc (NIH): 60 mg/dL (ref 0–99)
Triglycerides: 121 mg/dL (ref 0–149)
VLDL Cholesterol Cal: 21 mg/dL (ref 5–40)

## 2019-10-29 NOTE — Telephone Encounter (Signed)
New message:    Pt wants to know if his wife can come in with him tomorrow with him for his appt with Dr Jens Som.?

## 2019-10-30 ENCOUNTER — Ambulatory Visit: Payer: Medicare HMO | Admitting: Cardiology

## 2019-10-30 ENCOUNTER — Other Ambulatory Visit: Payer: Self-pay

## 2019-10-30 ENCOUNTER — Encounter: Payer: Self-pay | Admitting: Cardiology

## 2019-10-30 VITALS — BP 120/82 | HR 53 | Ht 72.0 in | Wt 167.0 lb

## 2019-10-30 DIAGNOSIS — I10 Essential (primary) hypertension: Secondary | ICD-10-CM

## 2019-10-30 DIAGNOSIS — I2510 Atherosclerotic heart disease of native coronary artery without angina pectoris: Secondary | ICD-10-CM | POA: Diagnosis not present

## 2019-10-30 DIAGNOSIS — Z95820 Peripheral vascular angioplasty status with implants and grafts: Secondary | ICD-10-CM | POA: Diagnosis not present

## 2019-10-30 DIAGNOSIS — E7800 Pure hypercholesterolemia, unspecified: Secondary | ICD-10-CM | POA: Diagnosis not present

## 2019-10-30 DIAGNOSIS — Z955 Presence of coronary angioplasty implant and graft: Secondary | ICD-10-CM | POA: Diagnosis not present

## 2019-10-30 DIAGNOSIS — E785 Hyperlipidemia, unspecified: Secondary | ICD-10-CM | POA: Diagnosis not present

## 2019-10-30 NOTE — Telephone Encounter (Signed)
This encounter was created in error - please disregard.

## 2019-10-30 NOTE — Patient Instructions (Signed)
Medication Instructions:  NO CHANGE *If you need a refill on your cardiac medications before your next appointment, please call your pharmacy*   Lab Work: If you have labs (blood work) drawn today and your tests are completely normal, you will receive your results only by: . MyChart Message (if you have MyChart) OR . A paper copy in the mail If you have any lab test that is abnormal or we need to change your treatment, we will call you to review the results.   Follow-Up: At [facility], you and your health needs are our priority.  As part of our continuing mission to provide you with exceptional heart care, we have created designated Provider Care Teams.  These Care Teams include your primary Cardiologist (physician) and Advanced Practice Providers (APPs -  Physician Assistants and Nurse Practitioners) who all work together to provide you with the care you need, when you need it.  We recommend signing up for the patient portal called "MyChart".  Sign up information is provided on this After Visit Summary.  MyChart is used to connect with patients for Virtual Visits (Telemedicine).  Patients are able to view lab/test results, encounter notes, upcoming appointments, etc.  Non-urgent messages can be sent to your provider as well.   To learn more about what you can do with MyChart, go to [email or web address].    Your next appointment:   6 month(s)  The format for your next appointment:   In Person  Provider:   Brian Crenshaw, MD    

## 2019-11-01 DIAGNOSIS — E785 Hyperlipidemia, unspecified: Secondary | ICD-10-CM | POA: Diagnosis not present

## 2019-11-01 DIAGNOSIS — I2510 Atherosclerotic heart disease of native coronary artery without angina pectoris: Secondary | ICD-10-CM | POA: Diagnosis not present

## 2019-11-01 DIAGNOSIS — Z95820 Peripheral vascular angioplasty status with implants and grafts: Secondary | ICD-10-CM | POA: Diagnosis not present

## 2019-11-01 DIAGNOSIS — Z955 Presence of coronary angioplasty implant and graft: Secondary | ICD-10-CM | POA: Diagnosis not present

## 2019-11-04 DIAGNOSIS — Z95820 Peripheral vascular angioplasty status with implants and grafts: Secondary | ICD-10-CM | POA: Diagnosis not present

## 2019-11-04 DIAGNOSIS — Z955 Presence of coronary angioplasty implant and graft: Secondary | ICD-10-CM | POA: Diagnosis not present

## 2019-11-04 DIAGNOSIS — I2510 Atherosclerotic heart disease of native coronary artery without angina pectoris: Secondary | ICD-10-CM | POA: Diagnosis not present

## 2019-11-04 DIAGNOSIS — E785 Hyperlipidemia, unspecified: Secondary | ICD-10-CM | POA: Diagnosis not present

## 2019-11-06 DIAGNOSIS — E785 Hyperlipidemia, unspecified: Secondary | ICD-10-CM | POA: Diagnosis not present

## 2019-11-06 DIAGNOSIS — Z955 Presence of coronary angioplasty implant and graft: Secondary | ICD-10-CM | POA: Diagnosis not present

## 2019-11-06 DIAGNOSIS — I2510 Atherosclerotic heart disease of native coronary artery without angina pectoris: Secondary | ICD-10-CM | POA: Diagnosis not present

## 2019-11-06 DIAGNOSIS — Z95820 Peripheral vascular angioplasty status with implants and grafts: Secondary | ICD-10-CM | POA: Diagnosis not present

## 2019-11-08 DIAGNOSIS — Z955 Presence of coronary angioplasty implant and graft: Secondary | ICD-10-CM | POA: Diagnosis not present

## 2019-11-12 DIAGNOSIS — Z955 Presence of coronary angioplasty implant and graft: Secondary | ICD-10-CM | POA: Diagnosis not present

## 2019-12-10 ENCOUNTER — Ambulatory Visit: Payer: Medicare HMO | Admitting: Cardiology

## 2019-12-17 DIAGNOSIS — N401 Enlarged prostate with lower urinary tract symptoms: Secondary | ICD-10-CM | POA: Diagnosis not present

## 2019-12-17 DIAGNOSIS — N3941 Urge incontinence: Secondary | ICD-10-CM | POA: Diagnosis not present

## 2019-12-17 DIAGNOSIS — N503 Cyst of epididymis: Secondary | ICD-10-CM | POA: Diagnosis not present

## 2019-12-17 DIAGNOSIS — Z79899 Other long term (current) drug therapy: Secondary | ICD-10-CM | POA: Diagnosis not present

## 2019-12-23 DIAGNOSIS — N503 Cyst of epididymis: Secondary | ICD-10-CM | POA: Diagnosis not present

## 2019-12-23 DIAGNOSIS — N442 Benign cyst of testis: Secondary | ICD-10-CM | POA: Diagnosis not present

## 2019-12-31 DIAGNOSIS — R69 Illness, unspecified: Secondary | ICD-10-CM | POA: Diagnosis not present

## 2020-01-15 DIAGNOSIS — N503 Cyst of epididymis: Secondary | ICD-10-CM | POA: Diagnosis not present

## 2020-01-15 DIAGNOSIS — R351 Nocturia: Secondary | ICD-10-CM | POA: Diagnosis not present

## 2020-01-15 DIAGNOSIS — N401 Enlarged prostate with lower urinary tract symptoms: Secondary | ICD-10-CM | POA: Diagnosis not present

## 2020-01-15 DIAGNOSIS — N3281 Overactive bladder: Secondary | ICD-10-CM | POA: Diagnosis not present

## 2020-01-21 ENCOUNTER — Ambulatory Visit: Payer: Medicare HMO | Admitting: Neurology

## 2020-01-29 ENCOUNTER — Other Ambulatory Visit: Payer: Self-pay

## 2020-01-29 ENCOUNTER — Ambulatory Visit: Payer: Medicare HMO | Admitting: Neurology

## 2020-01-29 ENCOUNTER — Telehealth: Payer: Self-pay | Admitting: Neurology

## 2020-01-29 ENCOUNTER — Encounter: Payer: Self-pay | Admitting: Neurology

## 2020-01-29 VITALS — BP 129/73 | HR 57 | Ht 72.0 in | Wt 177.8 lb

## 2020-01-29 DIAGNOSIS — R413 Other amnesia: Secondary | ICD-10-CM

## 2020-01-29 DIAGNOSIS — R202 Paresthesia of skin: Secondary | ICD-10-CM

## 2020-01-29 NOTE — Telephone Encounter (Signed)
[employer] medicare order sent to [facility]. They will obtain the auth and reach out to the patient to schedule.  °

## 2020-01-29 NOTE — Patient Instructions (Addendum)
Decrease Requip to 1 tablet at bedtime for 1-2 weeks if you do well stop it all together  I will order MRI of the brain, need to check on timing since recent stents  Check blood work today, for reversible causes of memory loss Please see you primary doctor about the foot swelling  See you back in 6 months

## 2020-01-29 NOTE — Progress Notes (Signed)
PATIENT: Jeremy Simon DOB: 1945-12-24  REASON FOR VISIT: follow up HISTORY FROM: patient  HISTORY OF PRESENT ILLNESS: Today 01/29/20  HISTORY  Jeremy Simon a 74 years old male, seen in request by his primary care physician Dr. Nona Dell, Corene Cornea for evaluation of neuropathy, initial evaluation was on December 04, 2018.  I have reviewed and summarized the referring note from the referring physician. He had past medical history of hypertension, hyperlipidemia, coronary artery disease, status post stent  Since 2019, he noticed frequent bilateral calf muscle deep achy pain, especially at nighttime, difficulty falling to sleep, massage, pacing around did not help, he also complains of bilateral feet, toes tingling, he denies burning pain, denies gait abnormality, mild low back pain, denies significant radiating pain, denies bowel bladder incontinence, he does complains of frequent shoulder achy pain, denies upper extremity paresthesia or weakness, he also has occasionally bilateral posterior thigh muscle cramping  He was seen by [facility] orthopedic surgeon Dr.Sisco, reported EMG nerve conduction study showed evidence of peripheral neuropathy, he was given prescription of gabapentin 300 mg every night without significant improvement,  Laboratory evaluation in February 2020 showed normal CBC, CMP with exception of mild elevated glucose 116, normal TSH, LDL was 53  UPDATE Sept 8 2020: I reviewedEMG nerve conduction study [facility] on July 20, 2018 by Lenox Ahr, DPT, ECS, showed diminished amplitude in bilateral sural, left superficial peroneal sensory study. 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  Laboratory evaluation in July 2020 showed normal negative protein electrophoresis, vitamin D, ferritin, C-reactive protein, B12, ESR, RPR, ANA, A1c was 5.4  He is also concerned about his mild memory loss, his mother suffered dementia in his 9s, occasionally sleep of memory, highly functional has no difficulty handling his daily activity, today's Mini-Mental Status Examination is 30 out of 30.  I personally reviewed MRI of lumbar in August 2020: Multilevel degenerative changes, hemangioma within L2, L4, no significant canal or foraminal narrowing   Update July 15, 2019 SS: Here today with his wife, continues to complain of burning in his calves, tingling in his feet, constant.  He has difficulty sleeping at night, but this has improved with higher dose trazodone 150 mg.  He is taking Requip 2 mg at bedtime, 10 pm.  He describes more of neuropathy symptoms, burning, tingling, that bother him, versus sudden urge to move his legs.  He wakes up every morning around 3 AM, not because of his neuropathy, but because he cannot sleep.  Will toss and turn 3-6 am.  Has seen a podiatrist, has orthotics, good support shoes, right metatarsalgia. Saw orthopedic, for swelling in the right foot, amlodipine was discontinued, wearing TED hose, now on HCTZ.  He used to be able to walk 1.5 miles, now has to stop around 1 mile due to his legs being tired, feeling heavy.    Update January 29, 2020 SS: Here today with wife, started on Cymbalta 30 mg, then PCP increased to 60 mg, was started  for neuropathy symptoms, unclear why they increased, is not sure its helping?  Also on Requip, unclear if this has any benefit.  On lower dose trazodone 50 mg at bedtime, sleeping great,  neuropathy or RLS symptoms do not wake him up.  No falls, has had swelling to the left foot for at least 6 months, was wearing TED hoses but stopped consistently during the summer.   New problem, reports concerns of memory loss for several years, more over the last several months.  His mother had early onset Alzheimer's at age 9, she has passed away.  He may lose things, has trouble finding things.  Has repetitive questioning.  He manages the finances, 2 times ordered items from Internet scams.  Went to the bank with a specific folder, got there, could not remember what he was supposed to do.  He keeps track of his medications, medical history, seems to do this well with this. He needs specific verbal instructions from his wife.  Had 2 cardiac stents placed in April 2021, 2 drug eluting stents proximal/mid LAD  REVIEW OF SYSTEMS: Out of a complete 14 system review of symptoms, the patient complains only of the following symptoms, and all other reviewed systems are negative.  Memory loss, foot swelling  ALLERGIES: Allergies  Allergen Reactions  . Altace [Ramipril]     Hives, lip swelling  . Norvasc [Amlodipine] Swelling    Left foot swelling    HOME MEDICATIONS: Outpatient Medications Prior to Visit  Medication Sig Dispense Refill  . acetaminophen (TYLENOL) 500 MG tablet Take 1,000 mg by mouth every 6 (six) hours as needed for mild pain or moderate pain.     Marland Kitchen aspirin EC 81 MG tablet Take 1 tablet (81 mg total) by mouth daily.    Marland Kitchen atorvastatin (LIPITOR) 80 MG tablet Take 1 tablet (80 mg total) by mouth daily. (Patient taking differently: Take 80 mg by mouth at bedtime. ) 90 tablet 3  . Calcium-Magnesium-Vitamin D (CALCIUM 500 PO) Take 1 tablet by mouth daily.    . Cholecalciferol (VITAMIN D3 PO) Take 5,000 mg by mouth. Patient take 4000 uints at noon     . clopidogrel (PLAVIX) 75 MG tablet Take 1 tablet (75 mg total) by mouth daily with breakfast. 90 tablet 1  . DULoxetine (CYMBALTA) 60  MG capsule Take 60 mg by mouth daily.    . fluticasone (FLONASE) 50 MCG/ACT nasal spray Place 1-2 sprays into both nostrils daily as needed for allergies.     . metoprolol succinate (TOPROL-XL) 25 MG 24 hr tablet TAKE 1 TABLET BY MOUTH EVERY DAY (Patient taking differently: Take 25 mg by mouth daily. ) 90 tablet 3  . mirabegron ER (MYRBETRIQ) 25 MG TB24 tablet Take 25 mg by mouth daily.    . nitroGLYCERIN (NITROSTAT) 0.4 MG SL tablet Place 1 tablet (0.4 mg total) under the tongue every 5 (five) minutes as needed. 25 tablet 2  . rOPINIRole (REQUIP) 1 MG tablet TAKE 2 TABLETS (2 MG TOTAL) BY MOUTH AT BEDTIME. 180 tablet 3  . tamsulosin (FLOMAX) 0.4 MG CAPS capsule Take 0.4 mg by mouth daily.    . traZODone (DESYREL) 50 MG tablet Take 50 mg by mouth at bedtime.     No facility-administered medications prior to visit.    PAST MEDICAL HISTORY: Past Medical History:  Diagnosis Date  . CAD   . HIATAL HERNIA   . HYPERLIPIDEMIA   . HYPERTENSION     PAST SURGICAL HISTORY: Past Surgical History:  Procedure Laterality Date  . CARDIAC CATHETERIZATION    . CORONARY STENT INTERVENTION N/A 08/14/2019   Procedure: CORONARY STENT INTERVENTION;  Surgeon: Burnell Blanks, MD;  Location: [place];  Service: Cardiovascular;  Laterality: N/A;  . LEFT HEART CATH AND CORONARY ANGIOGRAPHY N/A 08/14/2019   Procedure: LEFT HEART CATH AND CORONARY ANGIOGRAPHY;  Surgeon: Burnell Blanks, MD;  Location: [place] [facility];  Service: Cardiovascular;  Laterality: N/A;  . NOSE SURGERY    . TRANSURETHRAL RESECTION OF PROSTATE  03/2017    FAMILY HISTORY: Family History  Problem Relation Age of Onset  . Alzheimer's disease Mother        SOME TYPE OF VALUE DISORDER  . Heart attack Father   . Hypertension Father   . Obesity Father   . Hypertension Sister   . Diabetes Sister   . Obesity Sister   . Other Son        NO HEALTH PROBLEMS  . Other Daughter        NO HEALTH PROBLEMS     SOCIAL HISTORY: Social History   Socioeconomic History  . Marital status: Married    Spouse name: Not on file  . Number of children: Not on file  . Years of education: Not on file  . Highest education level: Not on file  Occupational History  . Not on file  Tobacco Use  . Smoking status: Never Smoker  . Smokeless tobacco: Never Used  Vaping Use  . Vaping Use: Never used  Substance and Sexual Activity  . Alcohol use: No    Alcohol/week: 0.0 standard drinks  . Drug use: No  . Sexual activity: Not on file  Other Topics Concern  . Not on file  Social History Narrative   Lives   Caffeine use:    Social Determinants of Health   Financial Resource Strain:   . Difficulty of Paying Living Expenses: Not on file  Food Insecurity:   . Worried About Charity fundraiser in the Last Year: Not on file  . Ran Out of Food in the Last Year: Not on file  Transportation Needs:   . Lack of Transportation (Medical): Not on file  . Lack of Transportation (Non-Medical): Not on file  Physical Activity:   . Days of Exercise per Week: Not on file  . Minutes of Exercise per Session: Not on file  Stress:   . Feeling of Stress : Not on file  Social Connections:   . Frequency of Communication with Friends and Family: Not on file  . Frequency of Social Gatherings with Friends and Family: Not on file  . Attends Religious Services: Not on file  . Active Member of Clubs or Organizations: Not on file  . Attends Archivist Meetings: Not on file  . Marital Status: Not on file  Intimate Partner Violence:   . Fear of Current or Ex-Partner: Not on file  . Emotionally Abused: Not on file  . Physically Abused: Not on file  . Sexually Abused: Not on file   PHYSICAL EXAM  Vitals:   01/29/20 0925  BP: 129/73  Pulse: (!) 57  Weight: 177 lb 12.8 oz (80.6 kg)  Height: 6' (1.829 m)   Body mass index is 24.11 kg/m.  Generalized: Well developed, in no acute distress  MMSE - Mini Mental  State Exam 01/29/2020 01/15/2019  Orientation to time 5 5  Orientation to Place 4 5  Registration 3 3  Attention/ Calculation 5  5  Recall 2 3  Language- name 2 objects 2 2  Language- repeat 1 1  Language- follow 3 step command 3 3  Language- read & follow direction 1 1  Write a sentence 1 1  Copy design 1 1  Total score 28 30    Neurological examination  Mentation: Alert oriented to time, place, history taking. Follows all commands speech and language fluent Cranial nerve II-XII: Pupils were equal round reactive to light. Extraocular movements were full, visual field were full on confrontational test. Facial sensation and strength were normal.  Head turning and shoulder shrug  were normal and symmetric. Motor: The motor testing reveals 5 over 5 strength of all 4 extremities. Good symmetric motor tone is noted throughout. 1+ pitting edema left lower extremity Sensory: Sensory testing is intact to soft touch on all 4 extremities. No evidence of extinction is noted.  Coordination: Cerebellar testing reveals good finger-nose-finger and heel-to-shin bilaterally.  Gait and station: Gait is normal. Tandem gait is normal. Romberg is negative. No drift is seen.  Reflexes: Deep tendon reflexes are symmetric and normal bilaterally.   DIAGNOSTIC DATA (LABS, IMAGING, TESTING) - I reviewed patient records, labs, notes, testing and imaging myself where available.  Lab Results  Component Value Date   WBC 7.5 08/07/2019   HGB 12.9 (L) 08/07/2019   HCT 38.1 08/07/2019   MCV 93 08/07/2019   PLT 182 08/07/2019      Component Value Date/Time   NA 141 09/05/2019 1054   K 4.9 09/05/2019 1054   CL 104 09/05/2019 1054   CO2 25 09/05/2019 1054   GLUCOSE 95 09/05/2019 1054   BUN 16 09/05/2019 1054   CREATININE 0.85 09/05/2019 1054   CALCIUM 8.9 09/05/2019 1054   PROT 6.1 10/28/2019 0809   ALBUMIN 4.2 10/28/2019 0809   AST 23 10/28/2019 0809   ALT 20 10/28/2019 0809   ALKPHOS 74 10/28/2019 0809    BILITOT 0.8 10/28/2019 0809   GFRNONAA 86 09/05/2019 1054   GFRAA 100 09/05/2019 1054   Lab Results  Component Value Date   CHOL 134 10/28/2019   HDL 53 10/28/2019   LDLCALC 60 10/28/2019   TRIG 121 10/28/2019   CHOLHDL 2.5 10/28/2019   Lab Results  Component Value Date   HGBA1C 5.4 12/04/2018   Lab Results  Component Value Date   VITAMINB12 390 12/04/2018   No results found for: TSH    ASSESSMENT AND PLAN 74 y.o. year old male  has a past medical history of CAD, HIATAL HERNIA, HYPERLIPIDEMIA, and HYPERTENSION. here with:  1.  Bilateral feet paresthesia, bilateral calf muscle deep aching pain, frequent thigh muscle cramping 2.  Restless leg syndrome -Outside EMG has shown bilateral axonal peripheral polyneuropathy -Laboratory evaluation showed no treatable etiology -Remains on Cymbalta 60 mg daily, is sleeping well with trazodone 50 mg at bedtime -Unsure benefit of Requip 2 mg at bedtime, will decrease to 1 mg at bedtime, if symptoms remain well controlled, will discontinue -Swelling to the left foot x 6 months, unclear etiology, will discuss with PCP, no reported injury  3.  Memory loss -Maternal family history of early onset Alzheimer's reportedly -Check for reversible causes of memory loss -Will get MRI of the brain without contrast -MMSE 28/30 today -Follow up in 6 months with Dr. Krista Blue or sooner if needed  Orders Placed This Encounter  Procedures  . MR BRAIN WO CONTRAST  . Vitamin B12  . RPR  . TSH  . Sedimentation Rate  I spent 30 minutes of face-to-face and non-face-to-face time with patient.  This included previsit chart review, lab review, study review, order entry, electronic health record documentation, patient education.  Butler Denmark, AGNP-C, DNP 01/29/2020, 10:22 AM [facility] [address] [place], [place] [address] [phone]

## 2020-01-30 LAB — SEDIMENTATION RATE: Sed Rate: 2 mm/hr (ref 0–30)

## 2020-01-30 LAB — VITAMIN B12: Vitamin B-12: 417 pg/mL (ref 232–1245)

## 2020-01-30 LAB — TSH: TSH: 1.5 u[IU]/mL (ref 0.450–4.500)

## 2020-01-30 LAB — RPR: RPR Ser Ql: NONREACTIVE

## 2020-02-02 ENCOUNTER — Encounter: Payer: Self-pay | Admitting: Neurology

## 2020-02-02 ENCOUNTER — Ambulatory Visit
Admission: RE | Admit: 2020-02-02 | Discharge: 2020-02-02 | Disposition: A | Discharge status: Home / Self Care | Payer: Medicare HMO | Source: Ambulatory Visit | Attending: Neurology | Admitting: Neurology

## 2020-02-02 DIAGNOSIS — R413 Other amnesia: Secondary | ICD-10-CM

## 2020-02-03 DIAGNOSIS — Z6824 Body mass index (BMI) 24.0-24.9, adult: Secondary | ICD-10-CM | POA: Diagnosis not present

## 2020-02-03 DIAGNOSIS — G2581 Restless legs syndrome: Secondary | ICD-10-CM | POA: Diagnosis not present

## 2020-02-03 DIAGNOSIS — G4700 Insomnia, unspecified: Secondary | ICD-10-CM | POA: Diagnosis not present

## 2020-02-03 DIAGNOSIS — M7989 Other specified soft tissue disorders: Secondary | ICD-10-CM | POA: Diagnosis not present

## 2020-02-03 DIAGNOSIS — N3281 Overactive bladder: Secondary | ICD-10-CM | POA: Diagnosis not present

## 2020-02-03 DIAGNOSIS — Z23 Encounter for immunization: Secondary | ICD-10-CM | POA: Diagnosis not present

## 2020-02-05 ENCOUNTER — Telehealth: Payer: Self-pay | Admitting: Neurology

## 2020-02-05 MED ORDER — MEMANTINE HCL 10 MG PO TABS: ORAL_TABLET | ORAL | 11 refills | Status: DC

## 2020-02-05 NOTE — Telephone Encounter (Addendum)
I called the patient. 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  Reviewed images with Dr. Terrace Arabia, is currently on aspirin and Plavix- Did reach out to Dr. Jens Som, he needs to stay on aspirin and plavix until April 2022, then may consider d/c Plavix. Will start Namenda for memory. He cut back Requip, doing well, less swelling to his foot and constipation has improved.   MRI brain (without) demonstrating; - Mild periventricular and subcortical chronic small vessel ischemic disease. - Few scattered punctate foci of chronic cerebral microhemorrhages.  - No acute findings.

## 2020-02-27 DIAGNOSIS — Z20822 Contact with and (suspected) exposure to covid-19: Secondary | ICD-10-CM | POA: Diagnosis not present

## 2020-03-09 DIAGNOSIS — N3281 Overactive bladder: Secondary | ICD-10-CM | POA: Diagnosis not present

## 2020-03-09 DIAGNOSIS — N401 Enlarged prostate with lower urinary tract symptoms: Secondary | ICD-10-CM | POA: Diagnosis not present

## 2020-03-09 DIAGNOSIS — R351 Nocturia: Secondary | ICD-10-CM | POA: Diagnosis not present

## 2020-03-11 NOTE — Progress Notes (Signed)
I have reviewed and agreed above plan. 

## 2020-03-24 DIAGNOSIS — M9902 Segmental and somatic dysfunction of thoracic region: Secondary | ICD-10-CM | POA: Diagnosis not present

## 2020-03-24 DIAGNOSIS — M542 Cervicalgia: Secondary | ICD-10-CM | POA: Diagnosis not present

## 2020-03-24 DIAGNOSIS — M9901 Segmental and somatic dysfunction of cervical region: Secondary | ICD-10-CM | POA: Diagnosis not present

## 2020-03-24 DIAGNOSIS — M546 Pain in thoracic spine: Secondary | ICD-10-CM | POA: Diagnosis not present

## 2020-03-24 DIAGNOSIS — M9903 Segmental and somatic dysfunction of lumbar region: Secondary | ICD-10-CM | POA: Diagnosis not present

## 2020-03-25 DIAGNOSIS — R413 Other amnesia: Secondary | ICD-10-CM | POA: Diagnosis not present

## 2020-03-25 DIAGNOSIS — N3281 Overactive bladder: Secondary | ICD-10-CM | POA: Diagnosis not present

## 2020-03-25 DIAGNOSIS — K5900 Constipation, unspecified: Secondary | ICD-10-CM | POA: Diagnosis not present

## 2020-03-25 DIAGNOSIS — I10 Essential (primary) hypertension: Secondary | ICD-10-CM | POA: Diagnosis not present

## 2020-03-25 DIAGNOSIS — Z6824 Body mass index (BMI) 24.0-24.9, adult: Secondary | ICD-10-CM | POA: Diagnosis not present

## 2020-04-08 DIAGNOSIS — R202 Paresthesia of skin: Secondary | ICD-10-CM | POA: Diagnosis not present

## 2020-04-08 DIAGNOSIS — G609 Hereditary and idiopathic neuropathy, unspecified: Secondary | ICD-10-CM | POA: Diagnosis not present

## 2020-04-15 NOTE — Progress Notes (Signed)
HPI: FU coronary artery disease. He has had a previous PCI of his LAD in 2000. Cath 4/21 showed 100 OM, 99 ostial to proximal LAD followed by 60; and normal LV function; had PCI of LAD with DES. Since I last saw him,he denies dyspnea, chest pain, palpitations or syncope.  Current Outpatient Medications  Medication Sig Dispense Refill  . acetaminophen (TYLENOL) 500 MG tablet Take 1,000 mg by mouth every 6 (six) hours as needed for mild pain or moderate pain.     Marland Kitchen aspirin EC 81 MG tablet Take 1 tablet (81 mg total) by mouth daily.    Marland Kitchen atorvastatin (LIPITOR) 80 MG tablet Take 1 tablet (80 mg total) by mouth daily. 90 tablet 3  . Calcium-Magnesium-Vitamin D (CALCIUM 500 PO) Take 1 tablet by mouth daily.    . Cholecalciferol (VITAMIN D3 PO) Take 5,000 mg by mouth. Patient take 4000 uints at noon    . clopidogrel (PLAVIX) 75 MG tablet Take 1 tablet (75 mg total) by mouth daily with breakfast. 90 tablet 1  . DULoxetine (CYMBALTA) 60 MG capsule Take 60 mg by mouth daily.    . fluticasone (FLONASE) 50 MCG/ACT nasal spray Place 1-2 sprays into both nostrils daily as needed for allergies.     . memantine (NAMENDA) 10 MG tablet Take 10 mg by mouth 2 (two) times daily.    . metoprolol succinate (TOPROL-XL) 25 MG 24 hr tablet TAKE 1 TABLET BY MOUTH EVERY DAY 90 tablet 3  . mirabegron ER (MYRBETRIQ) 50 MG TB24 tablet Take 50 mg by mouth daily.    . nitroGLYCERIN (NITROSTAT) 0.4 MG SL tablet Place 1 tablet (0.4 mg total) under the tongue every 5 (five) minutes as needed. 25 tablet 2  . rOPINIRole (REQUIP) 1 MG tablet Take 1 mg by mouth daily in the afternoon.    . tamsulosin (FLOMAX) 0.4 MG CAPS capsule Take 0.4 mg by mouth daily.    . traZODone (DESYREL) 50 MG tablet Take 50 mg by mouth at bedtime.     No current facility-administered medications for this visit.     Past Medical History:  Diagnosis Date  . CAD   . HIATAL HERNIA   . HYPERLIPIDEMIA   . HYPERTENSION     Past Surgical  History:  Procedure Laterality Date  . CARDIAC CATHETERIZATION    . CORONARY STENT INTERVENTION N/A 08/14/2019   Procedure: CORONARY STENT INTERVENTION;  Surgeon: Kathleene Hazel, MD;  Location: [facility];  Service: Cardiovascular;  Laterality: N/A;  . LEFT HEART CATH AND CORONARY ANGIOGRAPHY N/A 08/14/2019   Procedure: LEFT HEART CATH AND CORONARY ANGIOGRAPHY;  Surgeon: Kathleene Hazel, MD;  Location: [facility];  Service: Cardiovascular;  Laterality: N/A;  . NOSE SURGERY    . TRANSURETHRAL RESECTION OF PROSTATE  03/2017    Social History   Socioeconomic History  . Marital status: Married    Spouse name: Not on file  . Number of children: Not on file  . Years of education: Not on file  . Highest education level: Not on file  Occupational History  . Not on file  Tobacco Use  . Smoking status: Never Smoker  . Smokeless tobacco: Never Used  Vaping Use  . Vaping Use: Never used  Substance and Sexual Activity  . Alcohol use: No    Alcohol/week: 0.0 standard drinks  . Drug use: No  . Sexual activity: Not on file  Other Topics Concern  . Not  on file  Social History Narrative   Lives   Caffeine use:    Social Determinants of Health   Financial Resource Strain: Not on file  Food Insecurity: Not on file  Transportation Needs: Not on file  Physical Activity: Not on file  Stress: Not on file  Social Connections: Not on file  Intimate Partner Violence: Not on file    Family History  Problem Relation Age of Onset  . Alzheimer's disease Mother        SOME TYPE OF VALUE DISORDER  . Heart attack Father   . Hypertension Father   . Obesity Father   . Hypertension Sister   . Diabetes Sister   . Obesity Sister   . Other Son        NO HEALTH PROBLEMS  . Other Daughter        NO HEALTH PROBLEMS    ROS: no fevers or chills, productive cough, hemoptysis, dysphasia, odynophagia, melena, hematochezia, dysuria, hematuria, rash, seizure activity,  orthopnea, PND, pedal edema, claudication. Remaining systems are negative.  Physical Exam: Well-developed well-nourished in no acute distress.  Skin is warm and dry.  HEENT is normal.  Neck is supple.  Chest is clear to auscultation with normal expansion.  Cardiovascular exam is regular rate and rhythm.  Abdominal exam nontender or distended. No masses palpated. Extremities show no edema. neuro grossly intact  A/P  1 coronary artery disease-patient denies recurrent chest pain.  Continue aspirin, Plavix and statin.  Plan to discontinue Plavix May 2022.  2 hypertension-patient's blood pressure is controlled.  Continue present medical regimen.  3 hyperlipidemia-continue statin.  Olga Millers, MD

## 2020-04-19 DIAGNOSIS — Z20822 Contact with and (suspected) exposure to covid-19: Secondary | ICD-10-CM | POA: Diagnosis not present

## 2020-04-21 ENCOUNTER — Telehealth: Payer: Self-pay | Admitting: Cardiology

## 2020-04-21 NOTE — Telephone Encounter (Signed)
Encounter not needed nurse picked up 

## 2020-04-22 ENCOUNTER — Encounter: Payer: Self-pay | Admitting: Cardiology

## 2020-04-22 ENCOUNTER — Other Ambulatory Visit: Payer: Self-pay

## 2020-04-22 ENCOUNTER — Ambulatory Visit: Payer: Medicare HMO | Admitting: Cardiology

## 2020-04-22 VITALS — BP 126/72 | HR 54 | Ht 72.0 in | Wt 181.0 lb

## 2020-04-22 DIAGNOSIS — I10 Essential (primary) hypertension: Secondary | ICD-10-CM | POA: Diagnosis not present

## 2020-04-22 DIAGNOSIS — I2510 Atherosclerotic heart disease of native coronary artery without angina pectoris: Secondary | ICD-10-CM | POA: Diagnosis not present

## 2020-04-22 DIAGNOSIS — E7800 Pure hypercholesterolemia, unspecified: Secondary | ICD-10-CM

## 2020-04-22 NOTE — Patient Instructions (Signed)
Medication Instructions:   STOP CLOPIDOGREL THE FIRST OF MAY 2022  *If you need a refill on your cardiac medications before your next appointment, please call your pharmacy*   Follow-Up: At [facility], you and your health needs are our priority.  As part of our continuing mission to provide you with exceptional heart care, we have created designated Provider Care Teams.  These Care Teams include your primary Cardiologist (physician) and Advanced Practice Providers (APPs -  Physician Assistants and Nurse Practitioners) who all work together to provide you with the care you need, when you need it.  We recommend signing up for the patient portal called "MyChart".  Sign up information is provided on this After Visit Summary.  MyChart is used to connect with patients for Virtual Visits (Telemedicine).  Patients are able to view lab/test results, encounter notes, upcoming appointments, etc.  Non-urgent messages can be sent to your provider as well.   To learn more about what you can do with MyChart, go to [email or web address].    Your next appointment:   12 month(s)  The format for your next appointment:   In Person  Provider:   Olga Millers, MD

## 2020-05-20 DIAGNOSIS — N401 Enlarged prostate with lower urinary tract symptoms: Secondary | ICD-10-CM | POA: Diagnosis not present

## 2020-05-20 DIAGNOSIS — R351 Nocturia: Secondary | ICD-10-CM | POA: Diagnosis not present

## 2020-05-20 DIAGNOSIS — N3281 Overactive bladder: Secondary | ICD-10-CM | POA: Diagnosis not present

## 2020-05-20 DIAGNOSIS — N529 Male erectile dysfunction, unspecified: Secondary | ICD-10-CM | POA: Diagnosis not present

## 2020-06-07 DIAGNOSIS — E785 Hyperlipidemia, unspecified: Secondary | ICD-10-CM | POA: Diagnosis not present

## 2020-06-07 DIAGNOSIS — I2510 Atherosclerotic heart disease of native coronary artery without angina pectoris: Secondary | ICD-10-CM | POA: Diagnosis not present

## 2020-06-07 DIAGNOSIS — F4322 Adjustment disorder with anxiety: Secondary | ICD-10-CM | POA: Diagnosis not present

## 2020-06-18 DIAGNOSIS — Z961 Presence of intraocular lens: Secondary | ICD-10-CM | POA: Diagnosis not present

## 2020-06-18 DIAGNOSIS — H524 Presbyopia: Secondary | ICD-10-CM | POA: Diagnosis not present

## 2020-07-03 DIAGNOSIS — Z955 Presence of coronary angioplasty implant and graft: Secondary | ICD-10-CM | POA: Diagnosis not present

## 2020-07-03 DIAGNOSIS — I10 Essential (primary) hypertension: Secondary | ICD-10-CM | POA: Diagnosis not present

## 2020-07-03 DIAGNOSIS — F4322 Adjustment disorder with anxiety: Secondary | ICD-10-CM | POA: Diagnosis not present

## 2020-07-03 DIAGNOSIS — I2510 Atherosclerotic heart disease of native coronary artery without angina pectoris: Secondary | ICD-10-CM | POA: Diagnosis not present

## 2020-07-06 DIAGNOSIS — I2510 Atherosclerotic heart disease of native coronary artery without angina pectoris: Secondary | ICD-10-CM | POA: Diagnosis not present

## 2020-07-06 DIAGNOSIS — F4322 Adjustment disorder with anxiety: Secondary | ICD-10-CM | POA: Diagnosis not present

## 2020-07-06 DIAGNOSIS — E785 Hyperlipidemia, unspecified: Secondary | ICD-10-CM | POA: Diagnosis not present

## 2020-07-28 ENCOUNTER — Telehealth: Payer: Self-pay | Admitting: Neurology

## 2020-07-28 ENCOUNTER — Encounter: Payer: Self-pay | Admitting: Neurology

## 2020-07-28 ENCOUNTER — Ambulatory Visit: Payer: Medicare Other | Admitting: Neurology

## 2020-07-28 VITALS — BP 123/71 | HR 59 | Ht 72.0 in | Wt 182.5 lb

## 2020-07-28 DIAGNOSIS — G629 Polyneuropathy, unspecified: Secondary | ICD-10-CM

## 2020-07-28 DIAGNOSIS — R269 Unspecified abnormalities of gait and mobility: Secondary | ICD-10-CM | POA: Diagnosis not present

## 2020-07-28 DIAGNOSIS — R413 Other amnesia: Secondary | ICD-10-CM | POA: Diagnosis not present

## 2020-07-28 MED ORDER — MEMANTINE HCL 10 MG PO TABS: 10.0000 mg | ORAL_TABLET | Freq: Two times a day (BID) | ORAL | 4 refills | Status: DC

## 2020-07-28 NOTE — Progress Notes (Signed)
ASSESSMENT AND PLAN 75 y.o. year old male   Gait abnormality Peripheral neuropathy Urinary incontinence Neuropathic pain, restless leg symptoms Memory loss  Mini-Mental Status Examination 30 out of 30, animal naming was 14, refill Namenda 10 mg twice a day  MRI of the brain showed microhemorrhage, generalized atrophy, mild supratentorium small vessel disease,  He is on aspirin 81+ Plavix 75 mg daily due to history of coronary artery disease, status post stent placement April 2021, will on dual antiplatelet agent treatment for 1 year, then aspirin 81 mg alongside in May 2022  He was found to have less dependent sensory loss, previous EMG nerve conduction study confirmed axonal peripheral neuropathy, now he has worsening lower extremity weakness, neuropathic pain, hyperreflexia of bilateral patella on examination, also has worsening urinary incontinence, MRI of cervical spine to rule out a component of cervical spondylitic neuropathy  EMG nerve conduction study  Referral to physical therapy   DIAGNOSTIC DATA (LABS, IMAGING, TESTING) - I reviewed patient records, labs, notes, testing and imaging myself where available. MRI of the brain on January 04 2020: Generalized atrophy, microhemorrhage, mild supratentorial and small vessel disease, no acute abnormality.    HISTORY OF PRESENT ILLNESS: Jeremy Simon a 75 years old male, seen in request by his primary care physician Dr. Nona Dell, Corene Cornea for evaluation of neuropathy, initial evaluation was on December 04, 2018.  Past medical history of hypertension, hyperlipidemia, coronary artery disease, status post stent  Since 2019, he noticed frequent bilateral calf muscle deep achy pain, especially at nighttime, difficulty falling to sleep, massage, pacing around did not help, he also complains of bilateral feet, toes tingling, he denies burning pain, denies gait abnormality, mild low back pain, denies significant radiating pain, denies bowel  bladder incontinence, he does complains of frequent shoulder achy pain, denies upper extremity paresthesia or weakness, he also has occasionally bilateral posterior thigh muscle cramping  He was seen by [facility] orthopedic surgeon Dr.Sisco, reported EMG nerve conduction study showed evidence of peripheral neuropathy, he was given prescription of gabapentin 300 mg every night without significant improvement,  Laboratory evaluation in February 2020 showed normal CBC, CMP with exception of mild elevated glucose 116, normal TSH, LDL was 53  UPDATE Sept 8 2020: I reviewedEMG nerve conduction study [facility] orthopedics on July 20, 2018 by Lenox Ahr, DPT, NCS showed diminished amplitude in bilateral sural, left superficial peroneal sensory study. 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  Laboratory evaluation in July 2020 showed normal negative protein electrophoresis, vitamin D, ferritin, C-reactive protein, B12, ESR, RPR, ANA, A1c was 5.4  He is also concerned about his mild memory loss, his mother suffered dementia in his 62s, occasionally slip of memory, highly functional has no difficulty handling his daily activity, today's Mini-Mental Status Examination is 30 out of 30.  I personally reviewed MRI of lumbar in August 2020: Multilevel degenerative changes, hemangioma within L2, L4, no significant canal or foraminal narrowing   UPDATE July 28 2020: With change of his  medication, Requip 1 mg every night, he can sleep better, he complains of slow worsening bilateral lower extremity weakness, increased bilateral feet numbness tingling, especially during the early morning, it is painful to take the first few steps, he has to be careful not to 4  He had few years history of slow worsening urinary incontinence, has frequent nocturnal accident, is treated with Flomax 0.4 mg daily, Myrbetriq 25 mg every night, he has to set up clock every 3 hours to remind him using bathroom in the middle of the sleep  He had a coronary artery disease, status post stent April 2021, overlap with aspirin 81 mg plus Plavix 75 mg daily for 1 year, we will stop Plavix in May 2022  We personally reviewed MRI of brain without contrast, generalized atrophy, scattered microhemorrhage, mild supratentorial and small vessel disease.  Laboratory evaluation on July 03, 2020, normal hemoglobin 14.4, CMP, creatinine of 0.9, A1c of 5.5, LDL of 63   REVIEW OF SYSTEMS: Out of a complete 14 system review of symptoms, the patient complains only of the following symptoms, and all other reviewed systems are negative.  Memory loss, foot swelling  ALLERGIES: Allergies  Allergen Reactions  . Altace [Ramipril]     Hives, lip swelling  . Norvasc [Amlodipine] Swelling    Left foot swelling    HOME MEDICATIONS: Outpatient Medications Prior to Visit  Medication Sig Dispense Refill  . acetaminophen (TYLENOL) 500 MG tablet Take 1,000 mg by mouth every 6 (six) hours as needed for mild pain or moderate pain.     Marland Kitchen aspirin EC 81 MG tablet Take 1 tablet (81 mg total) by mouth daily.    Marland Kitchen atorvastatin (LIPITOR) 80 MG tablet Take 1 tablet (80 mg total) by mouth daily. 90 tablet 3  . Calcium-Magnesium-Vitamin D (CALCIUM 500 PO) Take 1 tablet by mouth daily.    . Cholecalciferol (VITAMIN D3 PO) Take 4,000 Units by mouth.    . clopidogrel (PLAVIX) 75 MG tablet Take 1 tablet (75 mg total) by mouth daily with  breakfast. 90 tablet 1  . DULoxetine (CYMBALTA) 60 MG capsule Take 60 mg by mouth daily.    . fluticasone (FLONASE) 50 MCG/ACT nasal spray Place 1-2 sprays into both nostrils daily as needed for allergies.     . memantine (NAMENDA) 10 MG tablet Take 10 mg by mouth 2 (two) times daily.    . metoprolol succinate (TOPROL-XL) 25 MG 24 hr tablet TAKE 1 TABLET BY MOUTH EVERY DAY 90 tablet 3  . MYRBETRIQ 25 MG TB24 tablet Take 25 mg by mouth daily.    . nitroGLYCERIN (NITROSTAT) 0.4 MG SL tablet Place 1 tablet (0.4 mg total) under the tongue every 5 (five) minutes as needed. 25 tablet 2  . rOPINIRole (REQUIP) 1 MG tablet Take 1 mg by mouth at bedtime.    . tamsulosin (FLOMAX) 0.4 MG CAPS capsule Take 0.4 mg by mouth daily.    . mirabegron ER (MYRBETRIQ) 50 MG TB24 tablet Take 50 mg by mouth daily.    . traZODone (DESYREL) 50 MG tablet Take 50 mg by mouth at bedtime.     No facility-administered medications prior to visit.    PAST MEDICAL HISTORY: Past Medical History:  Diagnosis Date  . CAD   . HIATAL HERNIA   . HYPERLIPIDEMIA   . HYPERTENSION     PAST SURGICAL HISTORY: Past Surgical History:  Procedure Laterality Date  . CARDIAC CATHETERIZATION    . CORONARY STENT INTERVENTION N/A 08/14/2019  Procedure: CORONARY STENT INTERVENTION;  Surgeon: Burnell Blanks, MD;  Location: [place] [facility];  Service: Cardiovascular;  Laterality: N/A;  . LEFT HEART CATH AND CORONARY ANGIOGRAPHY N/A 08/14/2019   Procedure: LEFT HEART CATH AND CORONARY ANGIOGRAPHY;  Surgeon: Burnell Blanks, MD;  Location: [place] [facility];  Service: Cardiovascular;  Laterality: N/A;  . NOSE SURGERY    . TRANSURETHRAL RESECTION OF PROSTATE  03/2017    FAMILY HISTORY: Family History  Problem Relation Age of Onset  . Alzheimer's disease Mother        SOME TYPE OF VALUE DISORDER  . Heart attack Father   . Hypertension Father   . Obesity Father   . Hypertension Sister   . Diabetes Sister   . Obesity  Sister   . Other Son        NO HEALTH PROBLEMS  . Other Daughter        NO HEALTH PROBLEMS    SOCIAL HISTORY: Social History   Socioeconomic History  . Marital status: Married    Spouse name: Not on file  . Number of children: Not on file  . Years of education: Not on file  . Highest education level: Not on file  Occupational History  . Not on file  Tobacco Use  . Smoking status: Never Smoker  . Smokeless tobacco: Never Used  Vaping Use  . Vaping Use: Never used  Substance and Sexual Activity  . Alcohol use: No    Alcohol/week: 0.0 standard drinks  . Drug use: No  . Sexual activity: Not on file  Other Topics Concern  . Not on file  Social History Narrative   Lives   Caffeine use:    Social Determinants of Health   Financial Resource Strain: Not on file  Food Insecurity: Not on file  Transportation Needs: Not on file  Physical Activity: Not on file  Stress: Not on file  Social Connections: Not on file  Intimate Partner Violence: Not on file   PHYSICAL EXAM  Vitals:   07/28/20 1004  BP: 123/71  Pulse: (!) 59  Weight: 182 lb 8 oz (82.8 kg)  Height: 6' (1.829 m)   Body mass index is 24.75 kg/m.  Generalized: Well developed, in no acute distress  MMSE - Mini Mental State Exam 07/28/2020 01/29/2020 01/15/2019  Orientation to time _0 Orientation to Place _1 Registration _2 Attention/ Calculation _3 Recall _4 Language- name 2 objects _5 Language- repeat _6 Language- follow 3 step command _7 Language- read & follow direction _8 Write a sentence _9 Copy design _10 Total score _11 Animal naming 14.   PHYSICAL EXAMNIATION:  Gen: NAD, conversant, well nourised, well groomed                     Cardiovascular: Regular rate rhythm, no peripheral edema, warm, nontender. Eyes: Conjunctivae clear without exudates or hemorrhage Neck: Supple, no carotid bruits. Pulmonary: Clear to auscultation bilaterally    NEUROLOGICAL EXAM:  MENTAL STATUS: Speech/Cognition: Awake, alert, normal speech, oriented to history taking and casual conversation.  CRANIAL NERVES: CN II: Visual fields are full to confrontation.  Pupils are round equal and briskly reactive to light. CN III, IV, VI: extraocular movement are normal. No ptosis. CN V: Facial sensation is intact to  light touch. CN VII: Face is symmetric with normal eye closure and smile. CN VIII: Hearing is normal to casual conversation CN IX, X: Palate elevates symmetrically. Phonation is normal. CN XI: Head turning and shoulder shrug are intact CN XII: Tongue is midline with normal movements and no atrophy.  MOTOR: Muscle bulk and tone are normal. Muscle strength is normal.  REFLEXES: Reflexes are 2  and symmetric at the biceps, triceps, 3/3 knees and absent at ankles. Plantar responses are flexor.  SENSORY: Length dependent decreased light touch, pinprick, vibratory sensation to mid shin  COORDINATION: There is no trunk or limb ataxia.    GAIT/STANCE: He can get up from seated position arm crossed, wide-based, unsteady, cautious, mild positive Romberg signs difficulty standing up on heels, better with tiptoe   Marcial Pacas, M.D. Ph.D.  [facility] [place], [place] [address] Phone: [phone] Fax:      [phone]

## 2020-07-28 NOTE — Telephone Encounter (Signed)
[employer] medicare order sent to [facility]. No auth they will reach out to the patient to schedule.  

## 2020-07-29 ENCOUNTER — Telehealth: Payer: Self-pay | Admitting: Neurology

## 2020-07-29 NOTE — Telephone Encounter (Signed)
Done

## 2020-07-29 NOTE — Telephone Encounter (Signed)
Pt. states that he no longer has [employer] as insu. coverage & he needs for referral to be sent to [facility]- Group # [phone]

## 2020-08-05 ENCOUNTER — Ambulatory Visit (INDEPENDENT_AMBULATORY_CARE_PROVIDER_SITE_OTHER): Payer: Medicare Other | Admitting: Neurology

## 2020-08-05 ENCOUNTER — Encounter: Payer: Medicare Other | Admitting: Neurology

## 2020-08-05 DIAGNOSIS — E785 Hyperlipidemia, unspecified: Secondary | ICD-10-CM | POA: Diagnosis not present

## 2020-08-05 DIAGNOSIS — F4322 Adjustment disorder with anxiety: Secondary | ICD-10-CM | POA: Diagnosis not present

## 2020-08-05 DIAGNOSIS — R413 Other amnesia: Secondary | ICD-10-CM

## 2020-08-05 DIAGNOSIS — I2510 Atherosclerotic heart disease of native coronary artery without angina pectoris: Secondary | ICD-10-CM | POA: Diagnosis not present

## 2020-08-05 DIAGNOSIS — Z0289 Encounter for other administrative examinations: Secondary | ICD-10-CM

## 2020-08-05 DIAGNOSIS — R269 Unspecified abnormalities of gait and mobility: Secondary | ICD-10-CM | POA: Diagnosis not present

## 2020-08-05 DIAGNOSIS — G629 Polyneuropathy, unspecified: Secondary | ICD-10-CM

## 2020-08-05 MED ORDER — DONEPEZIL HCL 10 MG PO TABS: 10.0000 mg | ORAL_TABLET | Freq: Every day | ORAL | 11 refills | Status: DC

## 2020-08-05 NOTE — Progress Notes (Signed)
ASSESSMENT AND PLAN 75 y.o. year old male   Gait abnormality Peripheral neuropathy Urinary incontinence Neuropathic pain, restless leg symptoms Memory loss  Previous Mini-Mental Status Examination 30 out of 30, animal naming was 14, wife confirmed that he does have mild memory loss, there is noticeable in his daily activity, continue Namenda 10 mg twice a day, add on Aricept 10 mg daily  MRI of the brain showed microhemorrhage, generalized atrophy, mild supratentorium small vessel disease,  He is on aspirin 81+ Plavix 75 mg daily due to history of coronary artery disease, status post stent placement April 2021, will on dual antiplatelet agent treatment for 1 year, then aspirin 81 mg alone starting in May 2022  He was found to have length dependent sensory loss, previous EMG nerve conduction study reported axonal peripheral neuropathy, now he has worsening lower extremity weakness, neuropathic pain, hyperreflexia of bilateral patella on examination, also has worsening urinary incontinence, MRI of cervical spine to rule out a component of cervical spondylitic neuropathy, scheduled for August 13, 2020  EMG nerve conduction study today showed no evidence of large fiber peripheral neuropathy,  Continue silver sneakers exercise  His restless leg symptoms has much improved taking Requip 1 mg 2 tablets every night, he can sleep very well, will try to decrease to 1 tablet every night   DIAGNOSTIC DATA (LABS, IMAGING, TESTING) - I reviewed patient records, labs, notes, testing and imaging myself where available. MRI of the brain on January 04 2020: Generalized atrophy, microhemorrhage, mild supratentorial and small vessel disease, no acute abnormality.    HISTORY OF PRESENT ILLNESS: Jeremy Simon a 75 years old male, seen in request by his primary care physician Dr. Nona Dell, Corene Cornea for evaluation of neuropathy, initial evaluation was on December 04, 2018.  Past medical history of hypertension,  hyperlipidemia, coronary artery disease, status post stent  Since 2019, he noticed frequent bilateral calf muscle deep achy pain, especially at nighttime, difficulty falling to sleep, massage, pacing around did not help, he also complains of bilateral feet, toes tingling, he denies burning pain, denies gait abnormality, mild low back pain, denies significant radiating pain, denies bowel bladder incontinence, he does complains of frequent shoulder achy pain, denies upper extremity paresthesia or weakness, he also has occasionally bilateral posterior thigh muscle cramping  He was seen by [facility] orthopedic surgeon Dr.Sisco, reported EMG nerve conduction study showed evidence of peripheral neuropathy, he was given prescription of gabapentin 300 mg every night without significant improvement,  Laboratory evaluation in February 2020 showed normal CBC, CMP with exception of mild elevated glucose 116, normal TSH, LDL was 53  UPDATE Sept 8 2020: I reviewedEMG nerve conduction study [facility]opedics on July 20, 2018 by Lenox Ahr, DPT, NCS showed diminished amplitude in bilateral sural, left superficial peroneal sensory study. 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  Laboratory evaluation in July 2020 showed normal negative protein electrophoresis, vitamin D, ferritin, C-reactive protein, B12, ESR, RPR, ANA, A1c was 5.4  He is also concerned  about his mild memory loss, his mother  suffered dementia in his 81s, occasionally slip of memory, highly functional has no difficulty handling his daily activity, today's Mini-Mental Status Examination is 30 out of 30.  I personally reviewed MRI of lumbar in August 2020: Multilevel degenerative changes, hemangioma within L2, L4, no significant canal or foraminal narrowing   UPDATE July 28 2020: With change of his medication, Requip 1 mg every night, he can sleep better, he complains of slow worsening bilateral lower extremity weakness, increased bilateral feet numbness tingling, especially during the early morning, it is painful to take the first few steps, he has to be careful not to 4  He had few years history of slow worsening urinary incontinence, has frequent nocturnal accident, is treated with Flomax 0.4 mg daily, Myrbetriq 25 mg every night, he has to set up clock every 3 hours to remind him using bathroom in the middle of the sleep  He had a coronary artery disease, status post stent April 2021, overlap with aspirin 81 mg plus Plavix 75 mg daily for 1 year, we will stop Plavix in May 2022  We personally reviewed MRI of brain without contrast, generalized atrophy, scattered microhemorrhage, mild supratentorial and small vessel disease.  Laboratory evaluation on July 03, 2020, normal hemoglobin 14.4, CMP, creatinine of 0.9, A1c of 5.5, LDL of 63  Update August 05, 2020 EMG nerve conduction study today is normal, there is no evidence of large fiber peripheral neuropathy, no evidence of right cervical radiculopathy  Continue complains of worsening gait abnormality, brisk reflex especially at bilateral patella, intermittent bilateral hands paresthesia, also has worsening urinary incontinence, MRI of cervical spine to rule out cervical spondylitic myelopathy, pending for August 13, 2020  He sleeps well with Requip 1 mg 2 tablets every night, will try to decrease to 1 mg every night, continue has worsening urinary urgency,  frequency, that is helped by Myrbetriq  REVIEW OF SYSTEMS: Out of a complete 14 system review of symptoms, the patient complains only of the following symptoms, and all other reviewed systems are negative.  Memory loss, foot swelling  ALLERGIES: Allergies  Allergen Reactions  . Altace [Ramipril]     Hives, lip swelling  . Norvasc [Amlodipine] Swelling    Left foot swelling    HOME MEDICATIONS: Outpatient Medications Prior to Visit  Medication Sig Dispense Refill  . acetaminophen (TYLENOL) 500 MG tablet Take 1,000 mg by mouth every 6 (six) hours as needed for mild pain or moderate pain.     Marland Kitchen aspirin EC 81 MG tablet Take 1 tablet (81 mg total) by mouth daily.    Marland Kitchen atorvastatin (LIPITOR) 80 MG tablet Take 1 tablet (80 mg total) by mouth daily. 90 tablet 3  . Calcium-Magnesium-Vitamin D (CALCIUM 500 PO) Take 1 tablet by mouth daily.    . Cholecalciferol (VITAMIN D3 PO) Take 4,000 Units by mouth.    . clopidogrel (PLAVIX) 75 MG tablet Take 1 tablet (75 mg total) by mouth daily with breakfast. 90 tablet 1  . DULoxetine (CYMBALTA) 60 MG capsule Take 60 mg by mouth daily.    . fluticasone (FLONASE) 50 MCG/ACT nasal spray Place 1-2 sprays into both nostrils daily as needed for allergies.     . memantine (NAMENDA) 10 MG tablet Take 1 tablet (10 mg total) by mouth 2 (two) times daily. 180 tablet 4  . metoprolol succinate (TOPROL-XL) 25 MG 24 hr tablet TAKE 1 TABLET BY MOUTH EVERY DAY 90 tablet 3  . MYRBETRIQ  25 MG TB24 tablet Take 25 mg by mouth daily.    . nitroGLYCERIN (NITROSTAT) 0.4 MG SL tablet Place 1 tablet (0.4 mg total) under the tongue every 5 (five) minutes as needed. 25 tablet 2  . rOPINIRole (REQUIP) 1 MG tablet Take 1 mg by mouth at bedtime.    . tamsulosin (FLOMAX) 0.4 MG CAPS capsule Take 0.4 mg by mouth daily.     No facility-administered medications prior to visit.    PAST MEDICAL HISTORY: Past Medical History:  Diagnosis Date  . CAD   . HIATAL HERNIA   .  HYPERLIPIDEMIA   . HYPERTENSION     PAST SURGICAL HISTORY: Past Surgical History:  Procedure Laterality Date  . CARDIAC CATHETERIZATION    . CORONARY STENT INTERVENTION N/A 08/14/2019   Procedure: CORONARY STENT INTERVENTION;  Surgeon: Burnell Blanks, MD;  Location: [place] [facility];  Service: Cardiovascular;  Laterality: N/A;  . LEFT HEART CATH AND CORONARY ANGIOGRAPHY N/A 08/14/2019   Procedure: LEFT HEART CATH AND CORONARY ANGIOGRAPHY;  Surgeon: Burnell Blanks, MD;  Location: [place] [facility];  Service: Cardiovascular;  Laterality: N/A;  . NOSE SURGERY    . TRANSURETHRAL RESECTION OF PROSTATE  03/2017    FAMILY HISTORY: Family History  Problem Relation Age of Onset  . Alzheimer's disease Mother        SOME TYPE OF VALUE DISORDER  . Heart attack Father   . Hypertension Father   . Obesity Father   . Hypertension Sister   . Diabetes Sister   . Obesity Sister   . Other Son        NO HEALTH PROBLEMS  . Other Daughter        NO HEALTH PROBLEMS    SOCIAL HISTORY: Social History   Socioeconomic History  . Marital status: Married    Spouse name: Not on file  . Number of children: Not on file  . Years of education: Not on file  . Highest education level: Not on file  Occupational History  . Not on file  Tobacco Use  . Smoking status: Never Smoker  . Smokeless tobacco: Never Used  Vaping Use  . Vaping Use: Never used  Substance and Sexual Activity  . Alcohol use: No    Alcohol/week: 0.0 standard drinks  . Drug use: No  . Sexual activity: Not on file  Other Topics Concern  . Not on file  Social History Narrative   Lives   Caffeine use:    Social Determinants of Health   Financial Resource Strain: Not on file  Food Insecurity: Not on file  Transportation Needs: Not on file  Physical Activity: Not on file  Stress: Not on file  Social Connections: Not on file  Intimate Partner Violence: Not on file   PHYSICAL EXAM  There were no vitals  filed for this visit. There is no height or weight on file to calculate BMI.  Generalized: Well developed, in no acute distress  MMSE - Mini Mental State Exam 07/28/2020 01/29/2020 01/15/2019  Orientation to time 5 5 5   Orientation to Place 5 4 5   Registration 3 3 3   Attention/ Calculation 5 5 5   Recall 3 2 3   Language- name 2 objects 2 2 2   Language- repeat 1 1 1   Language- follow 3 step command 3 3 3   Language- read & follow direction 1 1 1   Write a sentence 1 1 1   Copy design 1 1 1   Total score 30  28 30  Animal naming 14.   PHYSICAL EXAMNIATION:  Gen: NAD, conversant, well nourised, well groomed                     Cardiovascular: Regular rate rhythm, no peripheral edema, warm, nontender. Eyes: Conjunctivae clear without exudates or hemorrhage Neck: Supple, no carotid bruits. Pulmonary: Clear to auscultation bilaterally   NEUROLOGICAL EXAM:  MENTAL STATUS: Speech/Cognition: Awake, alert, normal speech, oriented to history taking and casual conversation.  CRANIAL NERVES: CN II: Visual fields are full to confrontation.  Pupils are round equal and briskly reactive to light. CN III, IV, VI: extraocular movement are normal. No ptosis. CN V: Facial sensation is intact to light touch. CN VII: Face is symmetric with normal eye closure and smile. CN VIII: Hearing is normal to casual conversation CN IX, X: Palate elevates symmetrically. Phonation is normal. CN XI: Head turning and shoulder shrug are intact CN XII: Tongue is midline with normal movements and no atrophy.  MOTOR: Muscle bulk and tone are normal. Muscle strength is normal.  REFLEXES: Reflexes are 2  and symmetric at the biceps, triceps, 3/3 knees and absent at ankles. Plantar responses are flexor.  SENSORY: Length dependent decreased light touch, pinprick, vibratory sensation to mid shin  COORDINATION: There is no trunk or limb ataxia.    GAIT/STANCE: He can get up from seated position arm crossed,  wide-based, unsteady, cautious, mild positive Romberg signs difficulty standing up on heels, better with tiptoe   Marcial Pacas, M.D. Ph.D.  [facility] [place], [place] [address] Phone: [phone] Fax:      [phone]

## 2020-08-13 ENCOUNTER — Ambulatory Visit
Admission: RE | Admit: 2020-08-13 | Discharge: 2020-08-13 | Disposition: A | Discharge status: Home / Self Care | Payer: Medicare Other | Source: Ambulatory Visit | Attending: Neurology | Admitting: Neurology

## 2020-08-13 ENCOUNTER — Other Ambulatory Visit: Payer: Self-pay

## 2020-08-13 DIAGNOSIS — R269 Unspecified abnormalities of gait and mobility: Secondary | ICD-10-CM | POA: Diagnosis not present

## 2020-08-13 DIAGNOSIS — G629 Polyneuropathy, unspecified: Secondary | ICD-10-CM

## 2020-08-13 DIAGNOSIS — R413 Other amnesia: Secondary | ICD-10-CM

## 2020-08-17 ENCOUNTER — Telehealth: Payer: Self-pay | Admitting: Neurology

## 2020-08-17 NOTE — Telephone Encounter (Signed)
Pt returned call. Please call back at [phone]

## 2020-08-17 NOTE — Telephone Encounter (Signed)
I spoke to the patient and provided him with the MRI cervical results. He would like an earlier an appt to review with Dr. Terrace Arabia. He has been placed on the scheduled 08/19/20. His wife will also attend this follow up.

## 2020-08-17 NOTE — Telephone Encounter (Signed)
  IMPRESSION: Abnormal MRI scan cervical spine without contrast showing prominent spondylitic changes from C3-C7 most prominent at C5-6 where there is moderate canal stenosis and bilateral foraminal narrowing with displacement of cord.  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  Please call patient, MRI of the cervical spine showed prominent spondylitic changes from C3-C7, most prominent at C5-6 level, evidence of moderate canal stenosis, bilateral foraminal narrowing  Above findings would explain his worsening bilateral lower extremity paresthesia, weakness,  If his symptoms continue to get worse, bring him early, to reevaluate and review MRI together

## 2020-08-17 NOTE — Telephone Encounter (Signed)
Left message on both numbers in chart. Requested a return call.

## 2020-08-19 ENCOUNTER — Encounter: Payer: Self-pay | Admitting: Neurology

## 2020-08-19 ENCOUNTER — Ambulatory Visit: Payer: Medicare Other | Admitting: Neurology

## 2020-08-19 ENCOUNTER — Other Ambulatory Visit: Payer: Self-pay

## 2020-08-19 VITALS — BP 138/65 | HR 61 | Ht 72.0 in | Wt 180.5 lb

## 2020-08-19 DIAGNOSIS — R413 Other amnesia: Secondary | ICD-10-CM | POA: Diagnosis not present

## 2020-08-19 DIAGNOSIS — M47812 Spondylosis without myelopathy or radiculopathy, cervical region: Secondary | ICD-10-CM | POA: Diagnosis not present

## 2020-08-19 DIAGNOSIS — G2581 Restless legs syndrome: Secondary | ICD-10-CM | POA: Diagnosis not present

## 2020-08-19 NOTE — Progress Notes (Signed)
ASSESSMENT AND PLAN 75 y.o. year old male   Mild cognitive impairment  Previous Mini-Mental Status Examination 30 out of 15, animal naming was 5, wife confirmed that he does have mild memory loss, there is noticeable change in his daily activity, continue Namenda 10 mg twice a day, and Aricept 10 mg daily  MRI of the brain showed microhemorrhage, generalized atrophy, mild supratentorium small vessel disease,  He is on aspirin 81+ Plavix 75 mg daily due to history of coronary artery disease, status post stent placement April 2021, will on dual antiplatelet agent treatment for 1 year, then aspirin 81 mg alone starting in May 2022 Cervical stenosis  He complains worsening bilateral lower extremity paresthesia was found to have length dependent sensory loss,    EMG nerve conduction study on August 05, 2020 showed no evidence of large fiber peripheral neuropathy,  MRI of the cervical spine shows prominent spondylitic changes, evidence of moderate canal stenosis at C5-6, patient does has hyperreflexia on examination, but there was no significant weakness, only mild gait abnormality, no incontinence, denies significant pain pain, he is not a surgical candidate, patient also does not want to proceed with surgical evaluation  Restless leg symptoms  His restless leg symptoms has much improved taking Requip 1 mg 2 tablets every night, he can sleep very well, will try to decrease to 1 tablet every night   DIAGNOSTIC DATA (LABS, IMAGING, TESTING) - I reviewed patient records, labs, notes, testing and imaging myself where available. MRI of the brain on January 04 2020: Generalized atrophy, microhemorrhage, mild supratentorial and small vessel disease, no acute abnormality.   MRI scan cervical spine without contrast on August 05, 2020 showing prominent spondylitic changes from C3-C7 most prominent at C5-6 where there is moderate canal stenosis and bilateral foraminal narrowing with displacement of cord.  C4-5  also shows fungal degenerative changes with left-sided foraminal narrowing. PHYSICAL EXAM  Vitals:   08/19/20 1447  BP: 138/65  Pulse: 61  Weight: 180 lb 8 oz (81.9 kg)  Height: 6' (1.829 m)   Body mass index is 24.48 kg/m.  Generalized: Well developed, in no acute distress  MMSE - Mini Mental State Exam 07/28/2020 01/29/2020 01/15/2019  Orientation to time 5 5 5   Orientation to Place 5 4 5   Registration 3 3 3   Attention/ Calculation 5 5 5   Recall 3 2 3   Language- name 2 objects 2 2 2   Language- repeat 1 1 1   Language- follow 3 step command 3 3 3   Language- read & follow direction 1 1 1   Write a sentence 1 1 1   Copy design 1 1 1   Total score 30 28 30   Animal naming 14.   PHYSICAL EXAMNIATION:  Gen: NAD, conversant, well nourised, well groomed                     Cardiovascular: Regular rate rhythm, no peripheral edema, warm, nontender. Eyes: Conjunctivae clear without exudates or hemorrhage Neck: Supple, no carotid bruits. Pulmonary: Clear to auscultation bilaterally   NEUROLOGICAL EXAM:  MENTAL STATUS: Speech/Cognition: Awake, alert, normal speech, oriented to history taking and casual conversation.  CRANIAL NERVES: CN II: Visual fields are full to confrontation.  Pupils are round equal and briskly reactive to light. CN III, IV, VI: extraocular movement are normal. No ptosis. CN V: Facial sensation is intact to light touch. CN VII: Face is symmetric with normal eye closure and smile. CN VIII: Hearing is normal to casual conversation  CN IX, X: Palate elevates symmetrically. Phonation is normal. CN XI: Head turning and shoulder shrug are intact CN XII: Tongue is midline with normal movements and no atrophy.  MOTOR: Muscle bulk and tone are normal. Muscle strength is normal.  REFLEXES: Reflexes are 2  and symmetric at the biceps, triceps, 3/3 knees and absent at ankles. Plantar responses are flexor.  SENSORY: Length dependent decreased light touch, pinprick,  vibratory sensation to mid shin  COORDINATION: There is no trunk or limb ataxia.    GAIT/STANCE: He can get up from seated position arm crossed, wide-based, unsteady, cautious, mild positive Romberg signs difficulty standing up on heels, better with tiptoe  HISTORY OF PRESENT ILLNESS: Jeremy Simon a 75 years old male, seen in request by his primary care physician Dr. Nona Dell, Corene Cornea for evaluation of neuropathy, initial evaluation was on December 04, 2018.  Past medical history of hypertension, hyperlipidemia, coronary artery disease, status post stent  Since 2019, he noticed frequent bilateral calf muscle deep achy pain, especially at nighttime, difficulty falling to sleep, massage, pacing around did not help, he also complains of bilateral feet, toes tingling, he denies burning pain, denies gait abnormality, mild low back pain, denies significant radiating pain, denies bowel bladder incontinence, he does complains of frequent shoulder achy pain, denies upper extremity paresthesia or weakness, he also has occasionally bilateral posterior thigh muscle cramping  He was seen by [facility] orthopedic surgeon Dr.Sisco, reported EMG nerve conduction study showed evidence of peripheral neuropathy, he was given prescription of gabapentin 300 mg every night without significant improvement,  Laboratory evaluation in February 2020 showed normal CBC, CMP with exception of mild elevated glucose 116, normal TSH, LDL was 53  UPDATE Sept 8 2020: I reviewedEMG nerve conduction study [facility] orthopedics on July 20, 2018 by Lenox Ahr, DPT, NCS showed diminished amplitude in bilateral sural, left superficial peroneal sensory study. 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  Laboratory evaluation in July 2020 showed normal negative protein electrophoresis, vitamin D, ferritin, C-reactive protein, B12, ESR, RPR, ANA, A1c was 5.4  He is also concerned about his mild memory loss, his mother suffered dementia in his 57s, occasionally slip of memory, highly functional has no difficulty handling his daily activity, today's Mini-Mental Status Examination is 30 out of 30.  I personally reviewed MRI of lumbar in August 2020: Multilevel degenerative changes, hemangioma within L2, L4, no significant canal or foraminal narrowing   UPDATE July 28 2020: With change of his medication, Requip 1 mg every night, he can sleep better, he complains of slow worsening bilateral lower extremity weakness, increased bilateral feet numbness tingling, especially during the early morning, it is painful to take the first few steps, he has to be careful not to 4  He had few years history of slow worsening urinary incontinence, has frequent nocturnal accident, is treated with Flomax 0.4 mg daily, Myrbetriq 25 mg every night, he has to set up clock every 3 hours to remind him using bathroom in the  middle of the sleep  He had a coronary artery disease, status post stent April 2021, overlap with aspirin 81 mg plus Plavix 75 mg daily for 1 year, we will stop Plavix in May 2022  We personally reviewed MRI of brain without contrast, generalized atrophy, scattered microhemorrhage, mild supratentorial and small vessel disease.  Laboratory evaluation on July 03, 2020, normal hemoglobin 14.4, CMP, creatinine of 0.9, A1c of 5.5, LDL of 63  Update August 05, 2020 EMG nerve conduction study today is normal, there is  no evidence of large fiber peripheral neuropathy, no evidence of right cervical radiculopathy  Continue complains of worsening gait abnormality, brisk reflex especially at bilateral patella, intermittent bilateral hands paresthesia, also has worsening urinary incontinence, MRI of cervical spine to rule out cervical spondylitic myelopathy, pending for August 13, 2020  He sleeps well with Requip 1 mg 2 tablets every night, will try to decrease to 1 mg every night, continue has worsening urinary urgency, frequency, that is helped by Myrbetriq  UPDATE August 19 2020: He is accompanied by his wife at today's clinical visit, continued complaints of lower extremity paresthesia, mildly unsteady gait urinary urgency, but denies incontinence  Personally reviewed MRI of cervical spine on August 13, 2020, prominent spondylitic changes from C3-7, most prominent at C5-6, there is moderate canal stenosis bilateral foraminal narrowing, disc displacement of the cord, C4-5 also's showed significant degenerative changes with moderate left-sided foraminal narrowing      REVIEW OF SYSTEMS: Out of a complete 14 system review of symptoms, the patient complains only of the following symptoms, and all other reviewed systems are negative.  Memory loss, foot swelling  ALLERGIES: Allergies  Allergen Reactions  . Altace [Ramipril]     Hives, lip swelling  . Norvasc [Amlodipine] Swelling    Left foot swelling    HOME MEDICATIONS: Outpatient Medications Prior to Visit  Medication Sig Dispense Refill  . acetaminophen (TYLENOL) 500 MG tablet Take 1,000 mg by mouth every 6 (six) hours as needed for mild pain or moderate pain.     Marland Kitchen aspirin EC 81 MG tablet Take 1 tablet (81 mg total) by mouth daily.    Marland Kitchen atorvastatin (LIPITOR) 80 MG tablet Take 1 tablet (80 mg total) by mouth daily. 90 tablet 3  . Calcium-Magnesium-Vitamin D (CALCIUM 500 PO) Take 1 tablet by mouth daily.    . Cholecalciferol (VITAMIN D3 PO) Take 5,000  Units by mouth daily.    . clopidogrel (PLAVIX) 75 MG tablet Take 1 tablet (75 mg total) by mouth daily with breakfast. 90 tablet 1  . donepezil (ARICEPT) 10 MG tablet Take 1 tablet (10 mg total) by mouth at bedtime. 30 tablet 11  . DULoxetine (CYMBALTA) 60 MG capsule Take 60 mg by mouth daily.    . fluticasone (FLONASE) 50 MCG/ACT nasal spray Place 1-2 sprays into both nostrils daily as needed for allergies.     . memantine (NAMENDA) 10 MG tablet Take 1 tablet (10 mg total) by mouth 2 (two) times daily. 180 tablet 4  . metoprolol succinate (TOPROL-XL) 25 MG 24 hr tablet TAKE 1 TABLET BY MOUTH EVERY DAY 90 tablet 3  . MYRBETRIQ 25 MG TB24 tablet Take 25 mg by mouth daily.    . nitroGLYCERIN (NITROSTAT) 0.4 MG SL tablet Place 1 tablet (0.4 mg total) under the tongue every 5 (five) minutes as needed. 25 tablet 2  . rOPINIRole (REQUIP) 1 MG tablet Take 2 mg by mouth at bedtime.    . tamsulosin (FLOMAX)  0.4 MG CAPS capsule Take 0.4 mg by mouth daily.     No facility-administered medications prior to visit.    PAST MEDICAL HISTORY: Past Medical History:  Diagnosis Date  . CAD   . HIATAL HERNIA   . HYPERLIPIDEMIA   . HYPERTENSION     PAST SURGICAL HISTORY: Past Surgical History:  Procedure Laterality Date  . CARDIAC CATHETERIZATION    . CORONARY STENT INTERVENTION N/A 08/14/2019   Procedure: CORONARY STENT INTERVENTION;  Surgeon: Burnell Blanks, MD;  Location: [facility];  Service: Cardiovascular;  Laterality: N/A;  . LEFT HEART CATH AND CORONARY ANGIOGRAPHY N/A 08/14/2019   Procedure: LEFT HEART CATH AND CORONARY ANGIOGRAPHY;  Surgeon: Burnell Blanks, MD;  Location: [facility];  Service: Cardiovascular;  Laterality: N/A;  . NOSE SURGERY    . TRANSURETHRAL RESECTION OF PROSTATE  03/2017    FAMILY HISTORY: Family History  Problem Relation Age of Onset  . Alzheimer's disease Mother        SOME TYPE OF VALUE DISORDER  . Heart attack Father   .  Hypertension Father   . Obesity Father   . Hypertension Sister   . Diabetes Sister   . Obesity Sister   . Other Son        NO HEALTH PROBLEMS  . Other Daughter        NO HEALTH PROBLEMS    SOCIAL HISTORY: Social History   Socioeconomic History  . Marital status: Married    Spouse name: Not on file  . Number of children: Not on file  . Years of education: Not on file  . Highest education level: Not on file  Occupational History  . Not on file  Tobacco Use  . Smoking status: Never Smoker  . Smokeless tobacco: Never Used  Vaping Use  . Vaping Use: Never used  Substance and Sexual Activity  . Alcohol use: No    Alcohol/week: 0.0 standard drinks  . Drug use: No  . Sexual activity: Not on file  Other Topics Concern  . Not on file  Social History Narrative   Lives   Caffeine use:    Social Determinants of Health   Financial Resource Strain: Not on file  Food Insecurity: Not on file  Transportation Needs: Not on file  Physical Activity: Not on file  Stress: Not on file  Social Connections: Not on file  Intimate Partner Violence: Not on file     Marcial Pacas, M.D. Ph.D.  [facility] [place], [place] [address] Phone: [phone] Fax:      [phone]

## 2020-08-24 NOTE — Procedures (Signed)
Full Name: Jeremy Simon Gender: Male MRN #: [id number] Date of Birth: 12/16/1945    Visit Date: 08/05/2020 09:04 Age: 75 Years Examining Physician: Levert Feinstein, MD  Referring Physician: Levert Feinstein, MD History: 75 year old male, with worsening bilateral lower extremity paresthesia, gait abnormality  Summary of the test:  Nerve conduction study: Bilateral sural, superficial peroneal sensory responses were normal.  Right median ulnar sensory and motor responses were normal.  Bilateral tibialis peroneal to EDB motor responses were normal.  Electromyography: Selected needle examination of right upper, lower extremity muscles, right cervical and lumbosacral paraspinal muscles were normal.   Conclusion: This is a normal study.  There is no electrodiagnostic evidence of large fiber peripheral neuropathy, right cervical or lumbosacral radiculopathy.    ------------------------------- Levert Feinstein, M.D. PhD  [facility] [address] [place], [place] [address] Tel: [phone] Fax: [phone]  Verbal informed consent was obtained from the patient, patient was informed of potential risk of procedure, including bruising, bleeding, hematoma formation, infection, muscle weakness, muscle pain, numbness, among others.        MNC    Nerve / Sites Muscle Latency Ref. Amplitude Ref. Rel Amp Segments Distance Velocity Ref. Area    ms ms mV mV %  cm m/s m/s mVms  R Median - APB     Wrist APB 3.9 ?4.4 8.3 ?4.0 100 Wrist - APB 7   31.8     Upper arm APB 8.6  7.7  92.4 Upper arm - Wrist 27 57 ?49 31.4  R Ulnar - ADM     Wrist ADM 2.8 ?3.3 11.8 ?6.0 100 Wrist - ADM 7   34.5     B.Elbow ADM 6.8  10.0  85 B.Elbow - Wrist 23 58 ?49 30.0     A.Elbow ADM 8.6  10.4  104 A.Elbow - B.Elbow 10 54 ?49 33.2  L Peroneal - EDB     Ankle EDB 3.1 ?6.5 3.6 ?2.0 100 Ankle - EDB 9   14.8     Fib head EDB 9.4  2.8  76.1 Fib head - Ankle 30 47 ?44 15.4     Pop fossa EDB 11.6   2.9  105 Pop fossa - Fib head 10 46 ?44 15.9         Pop fossa - Ankle      R Peroneal - EDB     Ankle EDB 3.8 ?6.5 2.6 ?2.0 100 Ankle - EDB 9   16.6     Fib head EDB 10.6  2.5  99.2 Fib head - Ankle 30 44 ?44 19.2     Pop fossa EDB 12.9  2.6  102 Pop fossa - Fib head 10 44 ?44 17.4         Pop fossa - Ankle      L Tibial - AH     Ankle AH 4.6 ?5.8 4.2 ?4.0 100 Ankle - AH 9   20.5     Pop fossa AH 13.7  3.6  85.7 Pop fossa - Ankle 39 43 ?41 22.0  R Tibial - AH     Ankle AH 5.2 ?5.8 4.1 ?4.0 100 Ankle - AH 9   22.2     Pop fossa AH 14.8  3.6  87.2 Pop fossa - Ankle 40 42 ?41 21.2                 SNC    Nerve / Sites Rec. Site Peak Lat  Ref.  Amp Ref. Segments Distance    ms ms V V  cm  L Sural - Ankle (Calf)     Calf Ankle 3.5 ?4.4 7 ?6 Calf - Ankle 14  R Sural - Ankle (Calf)     Calf Ankle 3.5 ?4.4 6 ?6 Calf - Ankle 14  L Superficial peroneal - Ankle     Lat leg Ankle 4.3 ?4.4 6 ?6 Lat leg - Ankle 14  R Superficial peroneal - Ankle     Lat leg Ankle 3.9 ?4.4 6 ?6 Lat leg - Ankle 14  R Median - Orthodromic (Dig II, Mid palm)     Dig II Wrist 3.1 ?3.4 12 ?10 Dig II - Wrist 13  R Ulnar - Orthodromic, (Dig V, Mid palm)     Dig V Wrist 2.9 ?3.1 6 ?5 Dig V - Wrist 49                 F  Wave    Nerve F Lat Ref.   ms ms  L Tibial - AH 55.6 ?56.0  R Tibial - AH 54.9 ?56.0  R Ulnar - ADM 32.0 ?32.0           EMG Summary Table    Spontaneous MUAP Recruitment  Muscle IA Fib PSW Fasc Other Amp Dur. Poly Pattern  R. First dorsal interosseous Normal None None None _______ Normal Normal Normal Normal  R. Pronator teres Normal None None None _______ Normal Normal Normal Normal  R. Biceps brachii Normal None None None _______ Normal Normal Normal Normal  R. Thoracic paraspinals Normal None None None _______ Normal Normal Normal Normal  R. Triceps brachii Normal None None None _______ Normal Normal Normal Normal  R. Tibialis anterior Normal None None None _______ Normal Normal Normal  Normal  R. Tibialis posterior Normal None None None _______ Normal Normal Normal Normal  R. Peroneus longus Normal None None None _______ Normal Normal Normal Normal  R. Gastrocnemius (Lateral head) Normal None None None _______ Normal Normal Normal Normal  R. Vastus lateralis Normal None None None _______ Normal Normal Normal Normal  R.  Lumbar paraspinals Normal None None None _______ Normal Normal Normal Normal  R. Cervical paraspinals Normal None None None _______ Normal Normal Normal Normal

## 2020-08-31 ENCOUNTER — Other Ambulatory Visit: Payer: Self-pay | Admitting: *Deleted

## 2020-08-31 MED ORDER — ROPINIROLE HCL 1 MG PO TABS: 1.0000 mg | ORAL_TABLET | Freq: Every day | ORAL | 1 refills | Status: DC

## 2020-08-31 MED ORDER — MEMANTINE HCL 10 MG PO TABS: 10.0000 mg | ORAL_TABLET | Freq: Two times a day (BID) | ORAL | 3 refills | Status: DC

## 2020-09-17 DIAGNOSIS — N529 Male erectile dysfunction, unspecified: Secondary | ICD-10-CM | POA: Diagnosis not present

## 2020-09-17 DIAGNOSIS — N401 Enlarged prostate with lower urinary tract symptoms: Secondary | ICD-10-CM | POA: Diagnosis not present

## 2020-09-17 DIAGNOSIS — N3281 Overactive bladder: Secondary | ICD-10-CM | POA: Diagnosis not present

## 2020-09-17 DIAGNOSIS — R351 Nocturia: Secondary | ICD-10-CM | POA: Diagnosis not present

## 2020-09-30 DIAGNOSIS — Z1331 Encounter for screening for depression: Secondary | ICD-10-CM | POA: Diagnosis not present

## 2020-09-30 DIAGNOSIS — Z9181 History of falling: Secondary | ICD-10-CM | POA: Diagnosis not present

## 2020-09-30 DIAGNOSIS — I10 Essential (primary) hypertension: Secondary | ICD-10-CM | POA: Diagnosis not present

## 2020-09-30 DIAGNOSIS — M7989 Other specified soft tissue disorders: Secondary | ICD-10-CM | POA: Diagnosis not present

## 2020-10-05 DIAGNOSIS — F4322 Adjustment disorder with anxiety: Secondary | ICD-10-CM | POA: Diagnosis not present

## 2020-10-05 DIAGNOSIS — I2510 Atherosclerotic heart disease of native coronary artery without angina pectoris: Secondary | ICD-10-CM | POA: Diagnosis not present

## 2020-10-05 DIAGNOSIS — E785 Hyperlipidemia, unspecified: Secondary | ICD-10-CM | POA: Diagnosis not present

## 2020-10-09 DIAGNOSIS — M7989 Other specified soft tissue disorders: Secondary | ICD-10-CM | POA: Diagnosis not present

## 2020-10-09 DIAGNOSIS — I10 Essential (primary) hypertension: Secondary | ICD-10-CM | POA: Diagnosis not present

## 2020-10-09 DIAGNOSIS — Z6824 Body mass index (BMI) 24.0-24.9, adult: Secondary | ICD-10-CM | POA: Diagnosis not present

## 2020-10-09 DIAGNOSIS — Q740 Other congenital malformations of upper limb(s), including shoulder girdle: Secondary | ICD-10-CM | POA: Diagnosis not present

## 2020-10-30 DIAGNOSIS — J069 Acute upper respiratory infection, unspecified: Secondary | ICD-10-CM | POA: Diagnosis not present

## 2020-10-30 DIAGNOSIS — U071 COVID-19: Secondary | ICD-10-CM | POA: Diagnosis not present

## 2020-11-10 DIAGNOSIS — N401 Enlarged prostate with lower urinary tract symptoms: Secondary | ICD-10-CM | POA: Diagnosis not present

## 2020-11-10 DIAGNOSIS — R351 Nocturia: Secondary | ICD-10-CM | POA: Diagnosis not present

## 2020-11-10 DIAGNOSIS — N529 Male erectile dysfunction, unspecified: Secondary | ICD-10-CM | POA: Diagnosis not present

## 2020-11-10 DIAGNOSIS — N3281 Overactive bladder: Secondary | ICD-10-CM | POA: Diagnosis not present

## 2020-11-11 DIAGNOSIS — Z6824 Body mass index (BMI) 24.0-24.9, adult: Secondary | ICD-10-CM | POA: Diagnosis not present

## 2020-11-11 DIAGNOSIS — M7989 Other specified soft tissue disorders: Secondary | ICD-10-CM | POA: Diagnosis not present

## 2020-11-11 DIAGNOSIS — J302 Other seasonal allergic rhinitis: Secondary | ICD-10-CM | POA: Diagnosis not present

## 2020-11-19 ENCOUNTER — Ambulatory Visit: Payer: Medicare Other | Admitting: Neurology

## 2020-11-23 DIAGNOSIS — Z20822 Contact with and (suspected) exposure to covid-19: Secondary | ICD-10-CM | POA: Diagnosis not present

## 2021-01-19 DIAGNOSIS — N529 Male erectile dysfunction, unspecified: Secondary | ICD-10-CM | POA: Diagnosis not present

## 2021-01-19 DIAGNOSIS — N3281 Overactive bladder: Secondary | ICD-10-CM | POA: Diagnosis not present

## 2021-01-19 DIAGNOSIS — N401 Enlarged prostate with lower urinary tract symptoms: Secondary | ICD-10-CM | POA: Diagnosis not present

## 2021-01-19 DIAGNOSIS — R351 Nocturia: Secondary | ICD-10-CM | POA: Diagnosis not present

## 2021-02-11 DIAGNOSIS — I10 Essential (primary) hypertension: Secondary | ICD-10-CM | POA: Diagnosis not present

## 2021-02-11 DIAGNOSIS — K5909 Other constipation: Secondary | ICD-10-CM | POA: Diagnosis not present

## 2021-02-11 DIAGNOSIS — Z6825 Body mass index (BMI) 25.0-25.9, adult: Secondary | ICD-10-CM | POA: Diagnosis not present

## 2021-02-22 ENCOUNTER — Telehealth: Payer: Self-pay | Admitting: Neurology

## 2021-02-22 NOTE — Telephone Encounter (Signed)
Please refer to MyChart message. Patient would like to keep in office visit on Thursday, 02/25/21. I advised him that this was fine and confirmed appt and check in time.

## 2021-02-22 NOTE — Telephone Encounter (Signed)
Pt called, would like a call from the nurse to discuss a office visit.

## 2021-02-25 ENCOUNTER — Encounter: Payer: Self-pay | Admitting: Neurology

## 2021-02-25 ENCOUNTER — Ambulatory Visit: Payer: Medicare Other | Admitting: Neurology

## 2021-02-25 ENCOUNTER — Telehealth: Payer: Self-pay | Admitting: Neurology

## 2021-02-25 ENCOUNTER — Telehealth: Payer: Medicare Other | Admitting: Neurology

## 2021-02-25 VITALS — BP 142/73 | HR 66 | Ht 72.0 in | Wt 184.0 lb

## 2021-02-25 DIAGNOSIS — R269 Unspecified abnormalities of gait and mobility: Secondary | ICD-10-CM

## 2021-02-25 DIAGNOSIS — R202 Paresthesia of skin: Secondary | ICD-10-CM | POA: Diagnosis not present

## 2021-02-25 DIAGNOSIS — R351 Nocturia: Secondary | ICD-10-CM

## 2021-02-25 DIAGNOSIS — G3184 Mild cognitive impairment, so stated: Secondary | ICD-10-CM

## 2021-02-25 NOTE — Patient Instructions (Signed)
Stop Requip  Namenda 10mg  twice a day Aricept 10mg  daily.  Water Aerobic Exercise more often

## 2021-02-25 NOTE — Progress Notes (Signed)
ASSESSMENT AND PLAN 75 y.o. year old male   Mild cognitive impairment  MoCA 22/30  MRI of the brain showed microhemorrhage, generalized atrophy, mild supratentorium small vessel disease,  He is on aspirin 81+ Plavix 75 mg daily due to history of coronary artery disease, status post stent placement April 2021, will on dual antiplatelet agent treatment for 1 year, then aspirin 81 mg alone starting in May 2022  Namenda 10 mg twice a day, Aricept 10 mg daily  Gait abnormality, nocturnal accident,  He complains worsening bilateral lower extremity paresthesia was found to have length dependent sensory loss,    EMG nerve conduction study on August 05, 2020 showed no evidence of large fiber peripheral neuropathy,  MRI of the cervical spine shows prominent spondylitic changes, evidence of moderate canal stenosis at C5-6, patient does has hyperreflexia on examination, but there was no significant weakness, mild gait abnormality, hyperreflexia of bilateral lower extremity, bilateral Babinski signs, with his complains of frequent nocturia, nocturnal urinary accident,  Will proceed with  MRI thoracic spine to rule out thoracic stenosis  Restless leg symptoms  His restless leg symptoms has much improved, he describes no difficulty sleeping, will stop the Requip,  Cymbalta 60 mg every day   DIAGNOSTIC DATA (LABS, IMAGING, TESTING) - I reviewed patient records, labs, notes, testing and imaging myself where available. MRI of the brain on January 04 2020: Generalized atrophy, microhemorrhage, mild supratentorial and small vessel disease, no acute abnormality.   MRI scan cervical spine without contrast on August 05, 2020 showing prominent spondylitic changes from C3-C7 most prominent at C5-6 where there is moderate canal stenosis and bilateral foraminal narrowing with displacement of cord.  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   HISTORY OF PRESENT ILLNESS: Jeremy Simon is a 75 years old male, seen in request by his primary care physician Dr. Nona Dell, Corene Cornea for evaluation of neuropathy, initial evaluation was on December 04, 2018.   Past medical history of hypertension, hyperlipidemia, coronary artery disease, status post stent   Since 2019, he noticed frequent bilateral calf muscle deep achy pain, especially at nighttime, difficulty falling to sleep, massage, pacing around did not help, he also complains of bilateral feet, toes tingling, he denies burning pain, denies gait abnormality, mild low back pain, denies significant radiating pain, denies bowel bladder incontinence, he does complains of frequent shoulder achy pain, denies upper extremity paresthesia or weakness, he also has occasionally bilateral posterior thigh muscle cramping   He was seen by [facility] orthopedic surgeon Dr.Sisco, reported EMG nerve conduction study showed evidence of peripheral neuropathy, he was given prescription of gabapentin 300 mg every night without significant improvement,   Laboratory evaluation in February 2020 showed normal CBC, CMP with exception of mild elevated glucose 116, normal TSH, LDL was 53   EMG nerve conduction study [facility] on July 20, 2018  was bilateral axonal peripheral polyneuropathy   He started Requip 1 mg 2 tablets every night since July 2020, not sure about the benefit, he can go to sleep without much difficulty, taking trazodone 50 mg every night to, but he often woke up at 4 AM, is hard for him to find a comfortable position, previously has tried gabapentin 300 mg every night without helping his symptoms.   Laboratory evaluation in July 2020 showed normal negative protein electrophoresis, vitamin D, ferritin, C-reactive protein, B12, ESR, RPR, ANA, A1c was 5.4   He is also concerned about his mild memory  loss, his mother suffered dementia in his 5s, occasionally slip of memory, highly functional has no difficulty handling his daily  activity, today's Mini-Mental Status Examination is 30 out of 30.   MRI of lumbar in August 2020: Multilevel degenerative changes, hemangioma within L2, L4, no significant canal or foraminal narrowing   UPDATE July 28 2020: With change of his medication, Requip 1 mg every night, he can sleep better, he complains of slow worsening bilateral lower extremity weakness, increased bilateral feet numbness tingling, especially during the early morning, it is painful to take the first few steps, he has to be careful not to 4  He had few years history of slow worsening urinary incontinence, has frequent nocturnal accident, is treated with Flomax 0.4 mg daily, Myrbetriq 25 mg every night, he has to set up clock every 3 hours to remind him using bathroom in the middle of the sleep  He had a coronary artery disease, status post stent April 2021, overlap with aspirin 81 mg plus Plavix 75 mg daily for 1 year, we will stop Plavix in May 2022  We personally reviewed MRI of brain without contrast, generalized atrophy, scattered microhemorrhage, mild supratentorial and small vessel disease.  Laboratory evaluation on July 03, 2020, normal hemoglobin 14.4, CMP, creatinine of 0.9, A1c of 5.5, LDL of 63   Update August 05, 2020 EMG nerve conduction study today is normal, there is no evidence of large fiber peripheral neuropathy, no evidence of right cervical radiculopathy  Continue complains of worsening gait abnormality, brisk reflex especially at bilateral patella, intermittent bilateral hands paresthesia, also has worsening urinary incontinence, MRI of cervical spine to rule out cervical spondylitic myelopathy, pending for August 13, 2020  He sleeps well with Requip 1 mg 2 tablets every night, will try to decrease to 1 mg every night, continue has worsening urinary urgency, frequency, that is helped by Myrbetriq  UPDATE August 19 2020: He is accompanied by his wife at today's clinical visit, continued complaints  of lower extremity paresthesia, mildly unsteady gait urinary urgency, but denies incontinence  Personally reviewed MRI of cervical spine on August 13, 2020, prominent spondylitic changes from C3-7, most prominent at C5-6, there is moderate canal stenosis bilateral foraminal narrowing, disc displacement of the cord, C4-5 also's showed significant degenerative changes with moderate left-sided foraminal narrowing  UPDATE Feb 27 2021: His restless leg symptoms has much improved,  denies difficulty sleeping, but he has to set up alarm clock to wake him up every 2 hours to use bathroom, few times each month, he would have urinary accident during sleep, sometimes he took his Requip 1 mg 2 tablets 5 AM when he wakes up early morning using bathroom, which seems to help his bilateral feet paresthesia,  He is not very physically active, have low back pain, continue has mild gait abnormality, chronic constipation,  Slow worsening memory loss, MoCA examination 22/30 today,  PHYSICAL EXAM  Vitals:   08/19/20 1447  BP: 138/65  Pulse: 61  Weight: 180 lb 8 oz (81.9 kg)  Height: 6' (1.829 m)   Body mass index is 24.48 kg/m.  Generalized: Well developed, in no acute distress   Montreal Cognitive Assessment  02/25/2021  Visuospatial/ Executive (0/5) 3  Naming (0/3) 3  Attention: Read list of digits (0/2) 2  Attention: Read list of letters (0/1) 1  Attention: Serial 7 subtraction starting at 100 (0/3) 3  Language: Repeat phrase (0/2) 2  Language : Fluency (0/1) 0  Abstraction (0/2) 2  Delayed  Recall (0/5) 0  Orientation (0/6) 6  Total 22  Adjusted Score (based on education) 22     PHYSICAL EXAMNIATION:  Gen: NAD, conversant, well nourised, well groomed                     Cardiovascular: Regular rate rhythm, no peripheral edema, warm, nontender. Eyes: Conjunctivae clear without exudates or hemorrhage Neck: Supple, no carotid bruits. Pulmonary: Clear to auscultation bilaterally   NEUROLOGICAL  EXAM:  MENTAL STATUS: Speech/Cognition: Awake, alert, normal speech, oriented to history taking and casual conversation.  CRANIAL NERVES: CN II: Visual fields are full to confrontation.  Pupils are round equal and briskly reactive to light. CN III, IV, VI: extraocular movement are normal. No ptosis. CN V: Facial sensation is intact to light touch. CN VII: Face is symmetric with normal eye closure and smile. CN VIII: Hearing is normal to casual conversation CN IX, X: Palate elevates symmetrically. Phonation is normal. CN XI: Head turning and shoulder shrug are intact CN XII: Tongue is midline with normal movements and no atrophy.  MOTOR: Muscle bulk and tone are normal. Muscle strength is normal.  REFLEXES: Reflexes are 2  and symmetric at the biceps, triceps, 3/3 knees and absent at ankles. Plantar responses are flexor.  SENSORY: Length dependent decreased light touch, pinprick, vibratory sensation to mid shin  COORDINATION: There is no trunk or limb ataxia.    GAIT/STANCE: He can get up from seated position arm crossed, wide-based, unsteady, cautious, mild positive Romberg signs difficulty standing up on heels, better with tiptoe   REVIEW OF SYSTEMS: Out of a complete 14 system review of symptoms, the patient complains only of the following symptoms, and all other reviewed systems are negative.  Memory loss, foot swelling  ALLERGIES: Allergies  Allergen Reactions   Altace [Ramipril]     Hives, lip swelling   Norvasc [Amlodipine] Swelling    Left foot swelling    HOME MEDICATIONS: Outpatient Medications Prior to Visit  Medication Sig Dispense Refill   acetaminophen (TYLENOL) 500 MG tablet Take 1,000 mg by mouth every 6 (six) hours as needed for mild pain or moderate pain.      aspirin EC 81 MG tablet Take 81 mg by mouth as needed. Swallow whole.     atorvastatin (LIPITOR) 80 MG tablet Take 1 tablet (80 mg total) by mouth daily. 90 tablet 3    Calcium-Magnesium-Vitamin D (CALCIUM 500 PO) Take 1 tablet by mouth daily.     Cholecalciferol (VITAMIN D3 PO) Take 5,000 Units by mouth daily.     donepezil (ARICEPT) 10 MG tablet Take 10 mg by mouth at bedtime.     DULoxetine (CYMBALTA) 60 MG capsule Take 60 mg by mouth daily.     fluticasone (FLONASE) 50 MCG/ACT nasal spray Place 1-2 sprays into both nostrils daily as needed for allergies.      hydrochlorothiazide (MICROZIDE) 12.5 MG capsule Take 12.5 mg by mouth daily.     linaclotide (LINZESS) 145 MCG CAPS capsule Take 145 mcg by mouth as needed.     memantine (NAMENDA) 10 MG tablet Take 1 tablet (10 mg total) by mouth 2 (two) times daily. 180 tablet 3   metoprolol succinate (TOPROL-XL) 25 MG 24 hr tablet TAKE 1 TABLET BY MOUTH EVERY DAY 90 tablet 3   MYRBETRIQ 25 MG TB24 tablet Take 25 mg by mouth at bedtime.     nitroGLYCERIN (NITROSTAT) 0.4 MG SL tablet Place 1 tablet (0.4 mg total) under the tongue  every 5 (five) minutes as needed. 25 tablet 2   rOPINIRole (REQUIP) 1 MG tablet Take 1-2 tablets (1-2 mg total) by mouth at bedtime. 180 tablet 1   tamsulosin (FLOMAX) 0.4 MG CAPS capsule Take 0.4 mg by mouth daily.     aspirin EC 81 MG tablet Take 1 tablet (81 mg total) by mouth daily. (Patient taking differently: Take 81 mg by mouth as needed.)     clopidogrel (PLAVIX) 75 MG tablet Take 1 tablet (75 mg total) by mouth daily with breakfast. 90 tablet 1   donepezil (ARICEPT) 10 MG tablet Take 1 tablet (10 mg total) by mouth at bedtime. 30 tablet 11   No facility-administered medications prior to visit.    PAST MEDICAL HISTORY: Past Medical History:  Diagnosis Date   CAD    HIATAL HERNIA    HYPERLIPIDEMIA    HYPERTENSION     PAST SURGICAL HISTORY: Past Surgical History:  Procedure Laterality Date   CARDIAC CATHETERIZATION     CORONARY STENT INTERVENTION N/A 08/14/2019   Procedure: CORONARY STENT INTERVENTION;  Surgeon: Burnell Blanks, MD;  Location: [place] [facility];   Service: Cardiovascular;  Laterality: N/A;   LEFT HEART CATH AND CORONARY ANGIOGRAPHY N/A 08/14/2019   Procedure: LEFT HEART CATH AND CORONARY ANGIOGRAPHY;  Surgeon: Burnell Blanks, MD;  Location: [place] [facility];  Service: Cardiovascular;  Laterality: N/A;   NOSE SURGERY     TRANSURETHRAL RESECTION OF PROSTATE  03/2017    FAMILY HISTORY: Family History  Problem Relation Age of Onset   Alzheimer's disease Mother        SOME TYPE OF VALUE DISORDER   Heart attack Father    Hypertension Father    Obesity Father    Hypertension Sister    Diabetes Sister    Obesity Sister    Other Son        NO HEALTH PROBLEMS   Other Daughter        NO HEALTH PROBLEMS    SOCIAL HISTORY: Social History   Socioeconomic History   Marital status: Married    Spouse name: Not on file   Number of children: Not on file   Years of education: Not on file   Highest education level: Not on file  Occupational History   Not on file  Tobacco Use   Smoking status: Never   Smokeless tobacco: Never  Vaping Use   Vaping Use: Never used  Substance and Sexual Activity   Alcohol use: No    Alcohol/week: 0.0 standard drinks   Drug use: No   Sexual activity: Not on file  Other Topics Concern   Not on file  Social History Narrative   Lives   Caffeine use:    Social Determinants of Health   Financial Resource Strain: Not on file  Food Insecurity: Not on file  Transportation Needs: Not on file  Physical Activity: Not on file  Stress: Not on file  Social Connections: Not on file  Intimate Partner Violence: Not on file     Marcial Pacas, M.D. Ph.D.  [facility] [place], [place] [address] Phone: [phone] Fax:      [phone]

## 2021-02-25 NOTE — Telephone Encounter (Signed)
[employer] medicare order sent to [facility], [facility] they will reach out to the patient to schedule.  

## 2021-03-02 DIAGNOSIS — Z23 Encounter for immunization: Secondary | ICD-10-CM | POA: Diagnosis not present

## 2021-03-08 DIAGNOSIS — F4322 Adjustment disorder with anxiety: Secondary | ICD-10-CM | POA: Diagnosis not present

## 2021-03-08 DIAGNOSIS — I2510 Atherosclerotic heart disease of native coronary artery without angina pectoris: Secondary | ICD-10-CM | POA: Diagnosis not present

## 2021-03-08 DIAGNOSIS — E785 Hyperlipidemia, unspecified: Secondary | ICD-10-CM | POA: Diagnosis not present

## 2021-03-09 ENCOUNTER — Other Ambulatory Visit: Payer: Self-pay

## 2021-03-09 ENCOUNTER — Ambulatory Visit
Admission: RE | Admit: 2021-03-09 | Discharge: 2021-03-09 | Disposition: A | Discharge status: Home / Self Care | Payer: Medicare Other | Source: Ambulatory Visit | Attending: Neurology | Admitting: Neurology

## 2021-03-09 DIAGNOSIS — G3184 Mild cognitive impairment, so stated: Secondary | ICD-10-CM

## 2021-03-09 DIAGNOSIS — R202 Paresthesia of skin: Secondary | ICD-10-CM | POA: Diagnosis not present

## 2021-03-09 DIAGNOSIS — R269 Unspecified abnormalities of gait and mobility: Secondary | ICD-10-CM | POA: Diagnosis not present

## 2021-03-09 DIAGNOSIS — R351 Nocturia: Secondary | ICD-10-CM

## 2021-03-12 ENCOUNTER — Telehealth: Payer: Self-pay | Admitting: *Deleted

## 2021-03-12 NOTE — Telephone Encounter (Addendum)
error 

## 2021-03-12 NOTE — Telephone Encounter (Signed)
I spoke to the patient. He verbalized understanding of the MRI findings.  ?

## 2021-03-12 NOTE — Telephone Encounter (Addendum)
Note from Dr. Terrace Arabia:  IMPRESSION:    MRI thoracic spine without contrast demonstrating: -No significant spinal stenosis or cord compression noted. -Anterior spondylosis and osteophyte disc complexes noted from T6-7 down to T10-11 levels. -Partially visualized are extensive degenerative changes in the cervical spine from C4-C7 levels, better visualized on MRI from 08/13/2020.   Please call patient, MRI of thoracic spine showed no significant stenosis, or spinal cord compression, multilevel degenerative changes.

## 2021-04-14 NOTE — Progress Notes (Signed)
HPI: FU coronary artery disease. He has had a previous PCI of his LAD in 2000. Cath 4/21 showed 100 OM, 99 ostial to proximal LAD followed by 60; and normal LV function; had PCI of LAD with DES. Since I last saw him, he denies dyspnea, chest pain, palpitations or syncope.  Current Outpatient Medications  Medication Sig Dispense Refill   acetaminophen (TYLENOL) 500 MG tablet Take 1,000 mg by mouth every 6 (six) hours as needed for mild pain or moderate pain.      aspirin EC 81 MG tablet Take 81 mg by mouth as needed. Swallow whole.     atorvastatin (LIPITOR) 80 MG tablet Take 1 tablet (80 mg total) by mouth daily. 90 tablet 3   DULoxetine (CYMBALTA) 60 MG capsule Take 60 mg by mouth daily.     fluticasone (FLONASE) 50 MCG/ACT nasal spray Place 1-2 sprays into both nostrils daily as needed for allergies.      linaclotide (LINZESS) 145 MCG CAPS capsule Take 145 mcg by mouth as needed.     memantine (NAMENDA) 10 MG tablet Take 1 tablet (10 mg total) by mouth 2 (two) times daily. 180 tablet 3   metoprolol succinate (TOPROL-XL) 25 MG 24 hr tablet TAKE 1 TABLET BY MOUTH EVERY DAY 90 tablet 3   MYRBETRIQ 25 MG TB24 tablet Take 25 mg by mouth at bedtime.     nitroGLYCERIN (NITROSTAT) 0.4 MG SL tablet Place 1 tablet (0.4 mg total) under the tongue every 5 (five) minutes as needed. 25 tablet 2   tamsulosin (FLOMAX) 0.4 MG CAPS capsule Take 0.4 mg by mouth daily.     Calcium-Magnesium-Vitamin D (CALCIUM 500 PO) Take 1 tablet by mouth daily.     Cholecalciferol (VITAMIN D3 PO) Take 5,000 Units by mouth daily.     donepezil (ARICEPT) 10 MG tablet Take 10 mg by mouth at bedtime.     hydrochlorothiazide (MICROZIDE) 12.5 MG capsule Take 12.5 mg by mouth daily.     rOPINIRole (REQUIP) 1 MG tablet Take 1-2 tablets (1-2 mg total) by mouth at bedtime. 180 tablet 1   No current facility-administered medications for this visit.     Past Medical History:  Diagnosis Date   CAD    HIATAL HERNIA     HYPERLIPIDEMIA    HYPERTENSION     Past Surgical History:  Procedure Laterality Date   CARDIAC CATHETERIZATION     CORONARY STENT INTERVENTION N/A 08/14/2019   Procedure: CORONARY STENT INTERVENTION;  Surgeon: Kathleene Hazel, MD;  Location: [facility];  Service: Cardiovascular;  Laterality: N/A;   LEFT HEART CATH AND CORONARY ANGIOGRAPHY N/A 08/14/2019   Procedure: LEFT HEART CATH AND CORONARY ANGIOGRAPHY;  Surgeon: Kathleene Hazel, MD;  Location: [facility];  Service: Cardiovascular;  Laterality: N/A;   NOSE SURGERY     TRANSURETHRAL RESECTION OF PROSTATE  03/2017    Social History   Socioeconomic History   Marital status: Married    Spouse name: Not on file   Number of children: Not on file   Years of education: Not on file   Highest education level: Not on file  Occupational History   Not on file  Tobacco Use   Smoking status: Never   Smokeless tobacco: Never  Vaping Use   Vaping Use: Never used  Substance and Sexual Activity   Alcohol use: No    Alcohol/week: 0.0 standard drinks   Drug use: No   Sexual activity:  Not on file  Other Topics Concern   Not on file  Social History Narrative   Lives   Caffeine use:    Social Determinants of Health   Financial Resource Strain: Not on file  Food Insecurity: Not on file  Transportation Needs: Not on file  Physical Activity: Not on file  Stress: Not on file  Social Connections: Not on file  Intimate Partner Violence: Not on file    Family History  Problem Relation Age of Onset   Alzheimer's disease Mother        SOME TYPE OF VALUE DISORDER   Heart attack Father    Hypertension Father    Obesity Father    Hypertension Sister    Diabetes Sister    Obesity Sister    Other Son        NO HEALTH PROBLEMS   Other Daughter        NO HEALTH PROBLEMS    ROS: no fevers or chills, productive cough, hemoptysis, dysphasia, odynophagia, melena, hematochezia, dysuria, hematuria, rash, seizure  activity, orthopnea, PND, pedal edema, claudication. Remaining systems are negative.  Physical Exam: Well-developed well-nourished in no acute distress.  Skin is warm and dry.  HEENT is normal.  Neck is supple.  Chest is clear to auscultation with normal expansion.  Cardiovascular exam is regular rate and rhythm.  Abdominal exam nontender or distended. No masses palpated. Extremities show no edema. neuro grossly intact  ECG-sinus bradycardia at a rate of 57, no ST changes.  Personally reviewed  A/P  1 coronary artery disease-Pt doing well with no CP; continue ASA and statin.   2 hypertension-BP elevated; however he follows this at home and it is typically controlled.  Continue present medications and follow.   3 hyperlipidemia-continue statin.  We will have his most recent lipids and liver forwarded to [place] from primary care.  Kirk Ruths, MD

## 2021-04-21 ENCOUNTER — Encounter: Payer: Self-pay | Admitting: Cardiology

## 2021-04-21 ENCOUNTER — Ambulatory Visit: Payer: Medicare Other | Admitting: Cardiology

## 2021-04-21 ENCOUNTER — Other Ambulatory Visit: Payer: Self-pay

## 2021-04-21 VITALS — BP 152/78 | HR 57 | Ht 72.0 in | Wt 182.8 lb

## 2021-04-21 DIAGNOSIS — I10 Essential (primary) hypertension: Secondary | ICD-10-CM | POA: Diagnosis not present

## 2021-04-21 DIAGNOSIS — I2510 Atherosclerotic heart disease of native coronary artery without angina pectoris: Secondary | ICD-10-CM | POA: Diagnosis not present

## 2021-04-21 DIAGNOSIS — E7800 Pure hypercholesterolemia, unspecified: Secondary | ICD-10-CM | POA: Diagnosis not present

## 2021-04-21 NOTE — Addendum Note (Signed)
Addended by: Freddi Starr on: 04/21/2021 12:51 PM   Modules accepted: Orders

## 2021-04-21 NOTE — Patient Instructions (Signed)

## 2021-05-19 ENCOUNTER — Telehealth: Payer: Self-pay | Admitting: Neurology

## 2021-05-19 MED ORDER — MEMANTINE HCL 10 MG PO TABS: 10.0000 mg | ORAL_TABLET | Freq: Two times a day (BID) | ORAL | 3 refills | Status: DC

## 2021-05-19 NOTE — Telephone Encounter (Signed)
Pt called states he has some information that he needs to give about refills to his pharmacy. Pt requesting  call back.

## 2021-05-19 NOTE — Telephone Encounter (Signed)
If is just nightmare, he may consider taking Aricept every morning instead of every night to see if that will decrease the nightmare,   It is okay to stop Aricept every night if he still has intolerable side effect,

## 2021-05-19 NOTE — Addendum Note (Signed)
Addended by: Ann Maki on: 05/19/2021 12:07 PM   Modules accepted: Orders

## 2021-05-19 NOTE — Telephone Encounter (Signed)
I called pt and advised of Dr. Zannie Cove response via vm. Pt advised to CB if he had any questions.

## 2021-05-19 NOTE — Telephone Encounter (Signed)
I called pt. No answer, left a message asking pt to call me back.   

## 2021-05-19 NOTE — Telephone Encounter (Signed)
Pt called and we discussed message. Pt reports he has tried aricept off and on since October and he has not been able to tolerate it. Pt wanted to know if he should continue this medication? I advised it would be ok to d/c due to the s/e of nightmares but would advise Dr. Terrace Arabia as well.  ______________________________________  Reports he needs a refill of the namenda, [facility]. He also wanted to confirm if our notes reflected the D/c of Requip and I advised it did per Dr. Zannie Cove note on 02/25/21.

## 2021-05-29 ENCOUNTER — Other Ambulatory Visit: Payer: Self-pay | Admitting: Neurology

## 2021-05-31 ENCOUNTER — Other Ambulatory Visit: Payer: Self-pay | Admitting: Neurology

## 2021-05-31 MED ORDER — DONEPEZIL HCL 10 MG PO TABS: 10.0000 mg | ORAL_TABLET | Freq: Every day | ORAL | 3 refills | Status: DC

## 2021-05-31 MED ORDER — DULOXETINE HCL 60 MG PO CPEP: 60.0000 mg | ORAL_CAPSULE | Freq: Every day | ORAL | 3 refills | Status: DC

## 2021-05-31 NOTE — Telephone Encounter (Signed)
At 8:01 pt left a vm asking for a call back to discuss a question her has about Ropinerol, please call pt.

## 2021-05-31 NOTE — Telephone Encounter (Signed)
I spoke to the patient. Says he is tolerating his donepezil 10mg  now that he is taking it in the morning. No further nightmares. He is still taking memantine, as well.  He is no longer taking ropinirole for his RLS. He has continued taking duloxetine 60mg , one capsule daily. He needs a new rx sent to [facility]. This dosage not previously prescribed by our office. Rx request sent to Dr. for approval.

## 2021-05-31 NOTE — Telephone Encounter (Signed)
Left message for a return call.  Last note on 02/25/21:  Restless leg symptoms             His restless leg symptoms has much improved, he describes no difficulty sleeping, will stop the Requip,             Cymbalta 60 mg every day

## 2021-06-03 ENCOUNTER — Telehealth: Payer: Self-pay | Admitting: Neurology

## 2021-06-03 NOTE — Telephone Encounter (Signed)
I called the patient back. He wanted to confirm that he should have stopped the ropinirole and duloxetine was started. He will continue this plan.

## 2021-06-03 NOTE — Telephone Encounter (Signed)
Pt would like a call from the nurse to discuss restarting ROPINIRole (REQUIP) 1 MG tablet. Had decided to stop the medication, but now down to 2 tablets want to stay on the medication

## 2021-06-14 DIAGNOSIS — R351 Nocturia: Secondary | ICD-10-CM | POA: Diagnosis not present

## 2021-06-14 DIAGNOSIS — N401 Enlarged prostate with lower urinary tract symptoms: Secondary | ICD-10-CM | POA: Diagnosis not present

## 2021-06-14 DIAGNOSIS — N529 Male erectile dysfunction, unspecified: Secondary | ICD-10-CM | POA: Diagnosis not present

## 2021-06-14 DIAGNOSIS — N3281 Overactive bladder: Secondary | ICD-10-CM | POA: Diagnosis not present

## 2021-06-21 DIAGNOSIS — Z961 Presence of intraocular lens: Secondary | ICD-10-CM | POA: Diagnosis not present

## 2021-07-01 ENCOUNTER — Encounter: Payer: Self-pay | Admitting: Gastroenterology

## 2021-07-06 DIAGNOSIS — E785 Hyperlipidemia, unspecified: Secondary | ICD-10-CM | POA: Diagnosis not present

## 2021-07-06 DIAGNOSIS — I10 Essential (primary) hypertension: Secondary | ICD-10-CM | POA: Diagnosis not present

## 2021-07-13 DIAGNOSIS — I10 Essential (primary) hypertension: Secondary | ICD-10-CM | POA: Diagnosis not present

## 2021-07-13 DIAGNOSIS — J302 Other seasonal allergic rhinitis: Secondary | ICD-10-CM | POA: Diagnosis not present

## 2021-07-13 DIAGNOSIS — F4322 Adjustment disorder with anxiety: Secondary | ICD-10-CM | POA: Diagnosis not present

## 2021-07-13 DIAGNOSIS — E785 Hyperlipidemia, unspecified: Secondary | ICD-10-CM | POA: Diagnosis not present

## 2021-07-15 DIAGNOSIS — H526 Other disorders of refraction: Secondary | ICD-10-CM | POA: Diagnosis not present

## 2021-07-19 DIAGNOSIS — M542 Cervicalgia: Secondary | ICD-10-CM | POA: Diagnosis not present

## 2021-07-19 DIAGNOSIS — Z6824 Body mass index (BMI) 24.0-24.9, adult: Secondary | ICD-10-CM | POA: Diagnosis not present

## 2021-07-23 ENCOUNTER — Encounter: Payer: Self-pay | Admitting: Gastroenterology

## 2021-07-23 ENCOUNTER — Ambulatory Visit (INDEPENDENT_AMBULATORY_CARE_PROVIDER_SITE_OTHER): Payer: Medicare Other | Admitting: Gastroenterology

## 2021-07-23 ENCOUNTER — Other Ambulatory Visit: Payer: Self-pay

## 2021-07-23 VITALS — BP 122/68 | HR 59 | Ht 72.0 in | Wt 180.4 lb

## 2021-07-23 DIAGNOSIS — K5900 Constipation, unspecified: Secondary | ICD-10-CM

## 2021-07-23 DIAGNOSIS — K625 Hemorrhage of anus and rectum: Secondary | ICD-10-CM

## 2021-07-23 NOTE — Progress Notes (Signed)
? ? ?Chief Complaint: For GI eval ? ?Referring Provider:  Prescilla Sours, FNP    ? ? ?ASSESSMENT AND PLAN;  ? ?#1. Rectal bleeding. D/d hoids, AVMs, colitis, polyps, stercoral ulcers etc, r/o colonic neoplasms or IBD. ? ?#2.  Chronic constipation.  Better with Metamucil and prunes. ? ?Plan: ?-Colon for further eval ?-Labs from [facility] ? ? ?Discussed risks & benefits of colonoscopy. Risks including rare perforation req laparotomy, bleeding after bx/polypectomy req blood transfusion, rarely missing neoplasms, risks of anesthesia/sedation, rare risk of damage to internal organs. Benefits outweigh the risks. Patient agrees to proceed. All the questions were answered. Pt consents to proceed. ? ? ?HPI:   ? ?Jeremy Simon is a 76 y.o. male  ?With CAD (Nl EF) , HTN, HLD,  ? ?C/O rectal bleeding -few episodes, 3 weeks ago, mostly bright red ?Was constipated at that time ?Mixed with the stool ?Has started metamucil and prunes with good relief of constipation.  The bleeding has resolved. ?He was given trial of Linzess 145 mcg but had diarrhea when he took the first dose.  He has quit Linzess for now.  ? ?Previously had to strain but not anymore. ? ?Seen by Dr. York Grice, had normal CBC, CMP.  Sent to GI clinic for further evaluation. ? ?He does take baby aspirin but no other blood thinners. ? ?No weight loss. ? ?No upper GI symptoms including nausea, vomiting, heartburn, regurgitation, odynophagia or dysphagia. ? ? ?Past GI procedures: ?Colonoscopy 12/2014 ([facility]): Minimal internal hemorrhoids, otherwise normal colonoscopy. ? ?Past Medical History:  ?Diagnosis Date  ? Anxiety   ? CAD   ? Erectile dysfunction   ? HIATAL HERNIA   ? HYPERLIPIDEMIA   ? HYPERTENSION   ? Numbness in feet   ? ? ?Past Surgical History:  ?Procedure Laterality Date  ? CARDIAC CATHETERIZATION    ? COLONOSCOPY  12/31/2014  ? Minimal internal hemorrhoids. Otherwise normal colonoscopy  ? CORONARY STENT INTERVENTION N/A 08/14/2019  ? Procedure: CORONARY  STENT INTERVENTION;  Surgeon: Kathleene Hazel, MD;  [facility];  Service: Cardiovascular;  Laterality: N/A;  ? LEFT HEART CATH AND CORONARY ANGIOGRAPHY N/A 08/14/2019  ? Procedure: LEFT HEART CATH AND CORONARY ANGIOGRAPHY;  Surgeon: Kathleene Hazel, MD;  [facility];  Service: Cardiovascular;  Laterality: N/A;  ? NOSE SURGERY    ? TRANSURETHRAL RESECTION OF PROSTATE  03/2017  ? ? ?Family History  ?Problem Relation Age of Onset  ? Alzheimer's disease Mother   ?     SOME TYPE OF VALUE DISORDER  ? Heart attack Father   ? Hypertension Father   ? Obesity Father   ? Hypertension Sister   ? Diabetes Sister   ? Obesity Sister   ? Other Daughter   ?     NO HEALTH PROBLEMS  ? Other Son   ?     NO HEALTH PROBLEMS  ? Colon cancer Neg Hx   ? Rectal cancer Neg Hx   ? Stomach cancer Neg Hx   ? Esophageal cancer Neg Hx   ? Pancreatic cancer Neg Hx   ? Liver cancer Neg Hx   ? ? ?Social History  ? ?Tobacco Use  ? Smoking status: Never  ? Smokeless tobacco: Never  ?Vaping Use  ? Vaping Use: Never used  ?Substance Use Topics  ? Alcohol use: No  ?  Alcohol/week: 0.0 standard drinks  ? Drug use: No  ? ? ?Current Outpatient Medications  ?Medication Sig  Dispense Refill  ? acetaminophen (TYLENOL) 500 MG tablet Take 1,000 mg by mouth every 6 (six) hours as needed for mild pain or moderate pain.     ? aspirin EC 81 MG tablet Take 81 mg by mouth as needed. Swallow whole.    ? atorvastatin (LIPITOR) 80 MG tablet Take 1 tablet (80 mg total) by mouth daily. 90 tablet 3  ? calcium carbonate (TUMS EX) 750 MG chewable tablet Chew 1 tablet by mouth daily.    ? DULoxetine (CYMBALTA) 60 MG capsule Take 1 capsule (60 mg total) by mouth daily. 90 capsule 3  ? memantine (NAMENDA) 10 MG tablet Take 1 tablet (10 mg total) by mouth 2 (two) times daily. 180 tablet 3  ? METAMUCIL FIBER PO Take by mouth.    ? metoprolol succinate (TOPROL-XL) 25 MG 24 hr tablet TAKE 1 TABLET BY MOUTH EVERY DAY 90 tablet 3  ?  MYRBETRIQ 50 MG TB24 tablet Take 50 mg by mouth daily.    ? nitroGLYCERIN (NITROSTAT) 0.4 MG SL tablet Place 1 tablet (0.4 mg total) under the tongue every 5 (five) minutes as needed. 25 tablet 2  ? rOPINIRole (REQUIP) 1 MG tablet Take 1 mg by mouth 2 (two) times daily.    ? tamsulosin (FLOMAX) 0.4 MG CAPS capsule Take 0.4 mg by mouth daily.    ? methylPREDNISolone (MEDROL DOSEPAK) 4 MG TBPK tablet See admin instructions. follow package directions    ? ?No current facility-administered medications for this visit.  ? ? ?Allergies  ?Allergen Reactions  ? Altace [Ramipril]   ?  Hives, lip swelling  ? Norvasc [Amlodipine] Swelling  ?  Left foot swelling  ? ? ?Review of Systems:  ?Constitutional: Denies fever, chills, diaphoresis, appetite change and fatigue.  ?HEENT: Denies photophobia, eye pain, redness, hearing loss, ear pain, congestion, sore throat, rhinorrhea, sneezing, mouth sores, neck pain, neck stiffness and tinnitus.   ?Respiratory: Denies SOB, DOE, cough, chest tightness,  and wheezing.   ?Cardiovascular: Denies chest pain, palpitations and leg swelling.  ?Genitourinary: Denies dysuria, urgency, frequency, hematuria, flank pain and difficulty urinating.  ?Musculoskeletal: Denies myalgias, back pain, joint swelling, arthralgias and gait problem.  ?Skin: No rash.  ?Neurological: Denies dizziness, seizures, syncope, weakness, light-headedness, numbness and headaches.  ?Hematological: Denies adenopathy. Easy bruising, personal or family bleeding history  ?Psychiatric/Behavioral: No anxiety or depression ? ?  ? ?Physical Exam:   ? ?BP 122/68   Pulse (!) 59   Ht 6' (1.829 m)   Wt 180 lb 6 oz (81.8 kg)   SpO2 97%   BMI 24.46 kg/m?  ?Wt Readings from Last 3 Encounters:  ?07/23/21 180 lb 6 oz (81.8 kg)  ?04/21/21 182 lb 12.8 oz (82.9 kg)  ?02/25/21 184 lb (83.5 kg)  ? ?Constitutional:  Well-developed, in no acute distress. ?Psychiatric: Normal mood and affect. Behavior is normal. ?HEENT: Pupils normal.   Conjunctivae are normal. No scleral icterus. ?Neck supple.  ?Cardiovascular: Normal rate, regular rhythm. No edema ?Pulmonary/chest: Effort normal and breath sounds normal. No wheezing, rales or rhonchi. ?Abdominal: Soft, nondistended. Nontender. Bowel sounds active throughout. There are no masses palpable. No hepatomegaly. ?Rectal: Deferred ?Neurological: Alert and oriented to person place and time. ?Skin: Skin is warm and dry. No rashes noted. ? ?Data Reviewed: I have personally reviewed following labs and imaging studies ? ?CBC: ?CBC Latest Ref Rng & Units 08/07/2019 05/11/2017  ?WBC 3.4 - 10.8 x10E3/uL 7.5 7.6  ?Hemoglobin 13.0 - 17.7 g/dL 12.9(L) 14.3  ?Hematocrit 37.5 -  51.0 % 38.1 42.0  ?Platelets 150 - 450 x10E3/uL 182 240  ? ? ?CMP: ?CMP Latest Ref Rng & Units 10/28/2019 09/05/2019 08/07/2019  ?Glucose 65 - 99 mg/dL - 95 94  ?BUN 8 - 27 mg/dL - 16 18  ?Creatinine 0.76 - 1.27 mg/dL - 0.63 0.16  ?Sodium 134 - 144 mmol/L - 141 141  ?Potassium 3.5 - 5.2 mmol/L - 4.9 4.5  ?Chloride 96 - 106 mmol/L - 104 101  ?CO2 20 - 29 mmol/L - 25 26  ?Calcium 8.6 - 10.2 mg/dL - 8.9 8.7  ?Total Protein 6.0 - 8.5 g/dL 6.1 - -  ?Total Bilirubin 0.0 - 1.2 mg/dL 0.8 - -  ?Alkaline Phos 48 - 121 IU/L 74 - -  ?AST 0 - 40 IU/L 23 - -  ?ALT 0 - 44 IU/L 20 - -  ? ? ? ? ?Edman Circle, MD 07/23/2021, 3:16 PM ? ?Cc: Prescilla Sours, FNP ? ? ?

## 2021-07-23 NOTE — Patient Instructions (Addendum)
If you are age 74 or older, your body mass index should be between 23-30. Your Body mass index is 24.46 kg/m?Marland Kitchen If this is out of the aforementioned range listed, please consider follow up with your Primary Care Provider. ? ?If you are age 24 or younger, your body mass index should be between 19-25. Your Body mass index is 24.46 kg/m?Marland Kitchen If this is out of the aformentioned range listed, please consider follow up with your Primary Care Provider.  ? ?________________________________________________________ ? ?The [facility] providers would like to encourage you to use [facility] to communicate with providers for non-urgent requests or questions.  Due to long hold times on the telephone, sending your provider a message by [facility] may be a faster and more efficient way to get a response.  Please allow 48 business hours for a response.  Please remember that this is for non-urgent requests.  ?_______________________________________________________ ? ?You have been scheduled for a colonoscopy. Please follow written instructions given to you at your visit today.  ?Please pick up your prep supplies at the pharmacy within the next 1-3 days. ?If you use inhalers (even only as needed), please bring them with you on the day of your procedure. ? ?We have given you samples of the following medication to take: ?Clenpiq ? ?Thank you, ? ?Dr. Jackquline Denmark ? ? ? ? ? ? ?We want to thank you for trusting [facility] with your care. All of our staff and providers value the relationships we have built with our patients, and it is an honor to care for you.  ? ?We are writing to let you know that [facility] will close on Sep 20, 2021, and we invite you to continue to see Dr. Carmell Austria and Gerrit Heck at the [facility] [place] office location. We are consolidating our serices at these [facility] practices to better provide care. Our office staff will work with you to ensure a seamless  transition.  ? ?Gerrit Heck, DO -Dr. Bryan Lemma will be movig to [facility] at [address] [place], [place] [address], effective Sep 20, 2021.  Contact [phone] to schedule an appointment with him.  ? ?Carmell Austria, MD- Dr. Lyndel Safe will be movig to [facility] at [address] [place], [place] [address], effective Sep 20, 2021.  Contact [phone] to schedule an appointment with him.  ? ?Requesting Medical Records ?If you need to request your medical records, please follow the instructions below. Your medical records are confidential, and a copy can be transferred to another provider or released to you or another person you designate only with your permission. ? ?There are several ways to request your medical records: ?Requests for medical records can be submitted through our practice.   ?You can also request your records electronically, in your MyChart account by selecting the ?Request Health Records? tab.  ?If you need additional information on how to request records, please go to [email or web address] choose Patient Information, then select Request Medical Records. ?To make an appointment or if you have any questions about your health care needs, please contact our office at [phone] and one of our staff members will be glad to assist you. ?[facility] is committed to providing exceptional care for you and our community. Thank you for allowing [place] to serve your health care needs. ?Sincerely, ? ?Windy Canny, Director [facility] ?[facility] also offers convenient virtual care options. Sore throat? Sinus problems? Cold or flu symptoms? Get care from  the comfort of home with [facility] Video Visits and e-Visits. Learn more about the non-emergency conditions treated and start your virtual visit at [email or web address] ? ?

## 2021-07-27 DIAGNOSIS — R351 Nocturia: Secondary | ICD-10-CM | POA: Diagnosis not present

## 2021-07-27 DIAGNOSIS — N3281 Overactive bladder: Secondary | ICD-10-CM | POA: Diagnosis not present

## 2021-07-27 DIAGNOSIS — N529 Male erectile dysfunction, unspecified: Secondary | ICD-10-CM | POA: Diagnosis not present

## 2021-07-27 DIAGNOSIS — N401 Enlarged prostate with lower urinary tract symptoms: Secondary | ICD-10-CM | POA: Diagnosis not present

## 2021-07-28 DIAGNOSIS — R269 Unspecified abnormalities of gait and mobility: Secondary | ICD-10-CM | POA: Diagnosis not present

## 2021-07-28 DIAGNOSIS — M542 Cervicalgia: Secondary | ICD-10-CM | POA: Diagnosis not present

## 2021-07-28 DIAGNOSIS — M47812 Spondylosis without myelopathy or radiculopathy, cervical region: Secondary | ICD-10-CM | POA: Diagnosis not present

## 2021-08-04 DIAGNOSIS — R2681 Unsteadiness on feet: Secondary | ICD-10-CM | POA: Diagnosis not present

## 2021-08-04 DIAGNOSIS — M542 Cervicalgia: Secondary | ICD-10-CM | POA: Diagnosis not present

## 2021-08-04 DIAGNOSIS — M2560 Stiffness of unspecified joint, not elsewhere classified: Secondary | ICD-10-CM | POA: Diagnosis not present

## 2021-08-10 ENCOUNTER — Encounter: Payer: Self-pay | Admitting: Certified Registered Nurse Anesthetist

## 2021-08-10 DIAGNOSIS — Z23 Encounter for immunization: Secondary | ICD-10-CM | POA: Diagnosis not present

## 2021-08-10 DIAGNOSIS — S61412A Laceration without foreign body of left hand, initial encounter: Secondary | ICD-10-CM | POA: Diagnosis not present

## 2021-08-10 DIAGNOSIS — M542 Cervicalgia: Secondary | ICD-10-CM | POA: Diagnosis not present

## 2021-08-11 ENCOUNTER — Encounter: Payer: Self-pay | Admitting: Gastroenterology

## 2021-08-12 DIAGNOSIS — M542 Cervicalgia: Secondary | ICD-10-CM | POA: Diagnosis not present

## 2021-08-12 DIAGNOSIS — M2560 Stiffness of unspecified joint, not elsewhere classified: Secondary | ICD-10-CM | POA: Diagnosis not present

## 2021-08-12 DIAGNOSIS — R2681 Unsteadiness on feet: Secondary | ICD-10-CM | POA: Diagnosis not present

## 2021-08-17 ENCOUNTER — Encounter: Payer: Self-pay | Admitting: Certified Registered Nurse Anesthetist

## 2021-08-18 ENCOUNTER — Ambulatory Visit (AMBULATORY_SURGERY_CENTER): Payer: Medicare Other | Admitting: Gastroenterology

## 2021-08-18 ENCOUNTER — Encounter: Payer: Self-pay | Admitting: Gastroenterology

## 2021-08-18 VITALS — BP 129/67 | HR 53 | Temp 98.4°F | Resp 12 | Ht 72.0 in | Wt 180.0 lb

## 2021-08-18 DIAGNOSIS — K640 First degree hemorrhoids: Secondary | ICD-10-CM

## 2021-08-18 DIAGNOSIS — E7800 Pure hypercholesterolemia, unspecified: Secondary | ICD-10-CM | POA: Diagnosis not present

## 2021-08-18 DIAGNOSIS — K5730 Diverticulosis of large intestine without perforation or abscess without bleeding: Secondary | ICD-10-CM | POA: Diagnosis not present

## 2021-08-18 DIAGNOSIS — K625 Hemorrhage of anus and rectum: Secondary | ICD-10-CM

## 2021-08-18 DIAGNOSIS — I10 Essential (primary) hypertension: Secondary | ICD-10-CM | POA: Diagnosis not present

## 2021-08-18 MED ORDER — HYDROCORTISONE (PERIANAL) 2.5 % EX CREA: 1.0000 "application " | TOPICAL_CREAM | Freq: Two times a day (BID) | CUTANEOUS | 2 refills | Status: DC | PRN

## 2021-08-18 MED ORDER — SODIUM CHLORIDE 0.9 % IV SOLN: 500.0000 mL | Freq: Once | INTRAVENOUS | Status: DC

## 2021-08-18 NOTE — Progress Notes (Signed)
? ? ?Chief Complaint: For GI eval ? ?Referring Provider:  Buckner MaltaBurgart, Jennifer, MD    ? ? ?ASSESSMENT AND PLAN;  ? ?#1. Rectal bleeding. D/d hoids, AVMs, colitis, polyps, stercoral ulcers etc, r/o colonic neoplasms or IBD. ? ?#2.  Chronic constipation.  Better with Metamucil and prunes. ? ?Plan: ?-Colon for further eval [facility] ? ? ?Discussed risks & benefits of colonoscopy. Risks including rare perforation req laparotomy, bleeding after bx/polypectomy req blood transfusion, rarely missing neoplasms, risks of anesthesia/sedation, rare risk of damage to internal organs. Benefits outweigh the risks. Patient agrees to proceed. All the questions were answered. Pt consents to proceed. ? ? ?HPI:   ? ?Jeremy Simon is a 76 y.o. male  ?With CAD (Nl EF) , HTN, HLD,  ? ?C/O rectal bleeding -few episodes, 3 weeks ago, mostly bright red ?Was constipated at that time ?Mixed with the stool ?Has started metamucil and prunes with good relief of constipation.  The bleeding has resolved. ?He was given trial of Linzess 145 mcg but had diarrhea when he took the first dose.  He has quit Linzess for now.  ? ?Previously had to strain but not anymore. ? ?Seen by Dr. York GriceBurgart, had normal CBC, CMP.  Sent to GI clinic for further evaluation. ? ?He does take baby aspirin but no other blood thinners. ? ?No weight loss. ? ?No upper GI symptoms including nausea, vomiting, heartburn, regurgitation, odynophagia or dysphagia. ? ? ?Past GI procedures: ?Colonoscopy 12/2014 ([facility]): Minimal internal hemorrhoids, otherwise normal colonoscopy. ? ?Past Medical History:  ?Diagnosis Date  ? Anxiety   ? Arthritis   ? CAD   ? Erectile dysfunction   ? HIATAL HERNIA   ? HYPERLIPIDEMIA   ? HYPERTENSION   ? Numbness in feet   ? ? ?Past Surgical History:  ?Procedure Laterality Date  ? CARDIAC CATHETERIZATION    ? COLONOSCOPY  12/31/2014  ? Minimal internal hemorrhoids. Otherwise normal colonoscopy  ? CORONARY STENT INTERVENTION N/A 08/14/2019  ?  Procedure: CORONARY STENT INTERVENTION;  Surgeon: Kathleene HazelMcAlhany, Christopher D, MD;  [facility];  Service: Cardiovascular;  Laterality: N/A;  ? LEFT HEART CATH AND CORONARY ANGIOGRAPHY N/A 08/14/2019  ? Procedure: LEFT HEART CATH AND CORONARY ANGIOGRAPHY;  Surgeon: Kathleene HazelMcAlhany, Christopher D, MD;  [facility]: [facility];  Service: Cardiovascular;  Laterality: N/A;  ? NOSE SURGERY    ? TRANSURETHRAL RESECTION OF PROSTATE  03/2017  ? ? ?Family History  ?Problem Relation Age of Onset  ? Alzheimer's disease Mother   ?     SOME TYPE OF VALUE DISORDER  ? Heart attack Father   ? Hypertension Father   ? Obesity Father   ? Hypertension Sister   ? Diabetes Sister   ? Obesity Sister   ? Other Daughter   ?     NO HEALTH PROBLEMS  ? Other Son   ?     NO HEALTH PROBLEMS  ? Colon cancer Neg Hx   ? Rectal cancer Neg Hx   ? Stomach cancer Neg Hx   ? Esophageal cancer Neg Hx   ? Pancreatic cancer Neg Hx   ? Liver cancer Neg Hx   ? ? ?Social History  ? ?Tobacco Use  ? Smoking status: Never  ? Smokeless tobacco: Never  ?Vaping Use  ? Vaping Use: Never used  ?Substance Use Topics  ? Alcohol use: No  ?  Alcohol/week: 0.0 standard drinks  ? Drug use: No  ? ? ?Current Outpatient  Medications  ?Medication Sig Dispense Refill  ? acetaminophen (TYLENOL) 500 MG tablet Take 1,000 mg by mouth every 6 (six) hours as needed for mild pain or moderate pain.     ? aspirin EC 81 MG tablet Take 81 mg by mouth as needed. Swallow whole.    ? atorvastatin (LIPITOR) 80 MG tablet Take 1 tablet (80 mg total) by mouth daily. 90 tablet 3  ? Azelastine HCl 137 MCG/SPRAY SOLN Place into both nostrils.    ? calcium carbonate (TUMS EX) 750 MG chewable tablet Chew 1 tablet by mouth daily.    ? DULoxetine (CYMBALTA) 60 MG capsule Take 1 capsule (60 mg total) by mouth daily. 90 capsule 3  ? memantine (NAMENDA) 10 MG tablet Take 1 tablet (10 mg total) by mouth 2 (two) times daily. 180 tablet 3  ? metoprolol succinate (TOPROL-XL) 25 MG 24 hr tablet TAKE  1 TABLET BY MOUTH EVERY DAY 90 tablet 3  ? MYRBETRIQ 50 MG TB24 tablet Take 50 mg by mouth daily.    ? rOPINIRole (REQUIP) 1 MG tablet Take 1 mg by mouth 2 (two) times daily.    ? tamsulosin (FLOMAX) 0.4 MG CAPS capsule Take 0.4 mg by mouth daily.    ? METAMUCIL FIBER PO Take by mouth.    ? nitroGLYCERIN (NITROSTAT) 0.4 MG SL tablet Place 1 tablet (0.4 mg total) under the tongue every 5 (five) minutes as needed. (Patient not taking: Reported on 08/18/2021) 25 tablet 2  ? ?Current Facility-Administered Medications  ?Medication Dose Route Frequency Provider Last Rate Last Admin  ? 0.9 %  sodium chloride infusion  500 mL Intravenous Once Lynann Bologna, MD      ? ? ?Allergies  ?Allergen Reactions  ? Altace [Ramipril]   ?  Hives, lip swelling  ? Norvasc [Amlodipine] Swelling  ?  Left foot swelling  ? ? ?Review of Systems:  ?Constitutional: Denies fever, chills, diaphoresis, appetite change and fatigue.  ?HEENT: Denies photophobia, eye pain, redness, hearing loss, ear pain, congestion, sore throat, rhinorrhea, sneezing, mouth sores, neck pain, neck stiffness and tinnitus.   ?Respiratory: Denies SOB, DOE, cough, chest tightness,  and wheezing.   ?Cardiovascular: Denies chest pain, palpitations and leg swelling.  ?Genitourinary: Denies dysuria, urgency, frequency, hematuria, flank pain and difficulty urinating.  ?Musculoskeletal: Denies myalgias, back pain, joint swelling, arthralgias and gait problem.  ?Skin: No rash.  ?Neurological: Denies dizziness, seizures, syncope, weakness, light-headedness, numbness and headaches.  ?Hematological: Denies adenopathy. Easy bruising, personal or family bleeding history  ?Psychiatric/Behavioral: No anxiety or depression ? ?  ? ?Physical Exam:   ? ?BP (!) 146/71   Pulse (!) 58   Temp 98.4 ?F (36.9 ?C)   Ht 6' (1.829 m)   Wt 180 lb (81.6 kg)   SpO2 98%   BMI 24.41 kg/m?  ?Wt Readings from Last 3 Encounters:  ?08/18/21 180 lb (81.6 kg)  ?07/23/21 180 lb 6 oz (81.8 kg)  ?04/21/21 182  lb 12.8 oz (82.9 kg)  ? ?Constitutional:  Well-developed, in no acute distress. ?Psychiatric: Normal mood and affect. Behavior is normal. ?HEENT: Pupils normal.  Conjunctivae are normal. No scleral icterus. ?Neck supple.  ?Cardiovascular: Normal rate, regular rhythm. No edema ?Pulmonary/chest: Effort normal and breath sounds normal. No wheezing, rales or rhonchi. ?Abdominal: Soft, nondistended. Nontender. Bowel sounds active throughout. There are no masses palpable. No hepatomegaly. ?Rectal: Deferred ?Neurological: Alert and oriented to person place and time. ?Skin: Skin is warm and dry. No rashes noted. ? ?Data Reviewed: I  have personally reviewed following labs and imaging studies ? ?CBC: ? ?  Latest Ref Rng & Units 08/07/2019  ?  1:02 PM 05/11/2017  ? 10:19 AM  ?CBC  ?WBC 3.4 - 10.8 x10E3/uL 7.5   7.6    ?Hemoglobin 13.0 - 17.7 g/dL 15.4   00.8    ?Hematocrit 37.5 - 51.0 % 38.1   42.0    ?Platelets 150 - 450 x10E3/uL 182   240    ? ? ?CMP: ? ?  Latest Ref Rng & Units 10/28/2019  ?  8:09 AM 09/05/2019  ? 10:54 AM 08/07/2019  ?  1:02 PM  ?CMP  ?Glucose 65 - 99 mg/dL  95   94    ?BUN 8 - 27 mg/dL  16   18    ?Creatinine 0.76 - 1.27 mg/dL  6.76   1.95    ?Sodium 134 - 144 mmol/L  141   141    ?Potassium 3.5 - 5.2 mmol/L  4.9   4.5    ?Chloride 96 - 106 mmol/L  104   101    ?CO2 20 - 29 mmol/L  25   26    ?Calcium 8.6 - 10.2 mg/dL  8.9   8.7    ?Total Protein 6.0 - 8.5 g/dL 6.1      ?Total Bilirubin 0.0 - 1.2 mg/dL 0.8      ?Alkaline Phos 48 - 121 IU/L 74      ?AST 0 - 40 IU/L 23      ?ALT 0 - 44 IU/L 20      ? ? ? ? ?Edman Circle, MD 08/18/2021, 10:47 AM ? ?Cc: Buckner Malta, MD ? ? ?

## 2021-08-18 NOTE — Progress Notes (Signed)
Report given to PACU, vss 

## 2021-08-18 NOTE — Op Note (Signed)
[facility] ?Patient Name: Jeremy BooksDennis Simon ?Procedure Date: 08/18/2021 10:43 AM ?MRN: [id number] ?Endoscopist: Lynann Bolognaajesh Brandilee Pies , MD ?Age: 76 ?Referring MD:  ?Date of Birth: 02-Jan-1946 ?Gender: Male ?Account #: [id number] ?Procedure:                Colonoscopy ?Indications:              Rectal bleeding ?Medicines:                Monitored Anesthesia Care ?Procedure:                Pre-Anesthesia Assessment: ?                          - Prior to the procedure, a History and Physical  ?                          was performed, and patient medications and  ?                          allergies were reviewed. The patient's tolerance of  ?                          previous anesthesia was also reviewed. The risks  ?                          and benefits of the procedure and the sedation  ?                          options and risks were discussed with the patient.  ?                          All questions were answered, and informed consent  ?                          was obtained. Prior Anticoagulants: The patient has  ?                          taken no previous anticoagulant or antiplatelet  ?                          agents. ASA Grade Assessment: II - A patient with  ?                          mild systemic disease. After reviewing the risks  ?                          and benefits, the patient was deemed in  ?                          satisfactory condition to undergo the procedure. ?                          After obtaining informed consent, the colonoscope  ?  was passed under direct vision. Throughout the  ?                          procedure, the patient's blood pressure, pulse, and  ?                          oxygen saturations were monitored continuously. The  ?                          Olympus Scope [id number] was introduced through the  ?                          anus and advanced to the the cecum, identified by  ?                          appendiceal orifice and ileocecal valve. The   ?                          colonoscopy was somewhat difficult due to a  ?                          redundant colon. Successful completion of the  ?                          procedure was aided by applying abdominal pressure.  ?                          The patient tolerated the procedure well. The  ?                          quality of the bowel preparation was adequate to  ?                          identify polyps 6 mm and larger in size. The  ?                          ileocecal valve, appendiceal orifice, and rectum  ?                          were photographed. ?Scope In: 10:53:37 AM ?Scope Out: 11:11:20 AM ?Scope Withdrawal Time: 0 hours 8 minutes 27 seconds  ?Total Procedure Duration: 0 hours 17 minutes 43 seconds  ?Findings:                 A few small-mouthed diverticula were found in the  ?                          sigmoid colon. ?                          Non-bleeding internal hemorrhoids were found during  ?                          retroflexion and during perianal exam. The  ?  hemorrhoids were small and Grade I (internal  ?                          hemorrhoids that do not prolapse). ?                          The exam was otherwise, grossly, without  ?                          abnormality on direct and retroflexion views. The  ?                          colon was highly redundant. ?Complications:            No immediate complications. ?Estimated Blood Loss:     Estimated blood loss: none. ?Impression:               - Minimal sigmoid diverticulosis. ?                          - Non-bleeding internal hemorrhoids (likely  ?                          etiology of rectal bleed). ?                          - The examination was otherwise normal on direct  ?                          and retroflexion views. ?                          - No specimens collected. ?Recommendation:           - Patient has a contact number available for  ?                          emergencies. The signs and  symptoms of potential  ?                          delayed complications were discussed with the  ?                          patient. Return to normal activities tomorrow.  ?                          Written discharge instructions were provided to the  ?                          patient. ?                          - Resume previous diet. ?                          - Continue present medications. ?                          -  Miralax 1 capful (17 grams) in 8 ounces of water  ?                          PO daily. ?                          - Continue Metamucil/prunes as before ?                          - Use HC Cream 2.5%: Apply externally BID PRN for  ?                          10 days 2RF. ?                          - The findings and recommendations were discussed  ?                          with the patient's family. ?                          - No need to repeat colonoscopy unless any new  ?                          problems. If it is needed in future, he would  ?                          require 2-day prep. ?Lynann Bologna, MD ?08/18/2021 11:16:59 AM ?This report has been signed electronically. ?

## 2021-08-18 NOTE — Patient Instructions (Signed)
Miralax 1 capful in 8 oz water daily ?Continue Metamucil/prunes as before ?Use HC cream 2.5 % as needed 2 x per day for 10 days to hemorrhoids ? ?YOU HAD AN ENDOSCOPIC PROCEDURE TODAY: Refer to the procedure report and other information in the discharge instructions given to you for any specific questions about what was found during the examination. If this information does not answer your questions, please call [place] office at [phone] to clarify.  ? ?YOU SHOULD EXPECT: Some feelings of bloating in the abdomen. Passage of more gas than usual. Walking can help get rid of the air that was put into your GI tract during the procedure and reduce the bloating. If you had a lower endoscopy (such as a colonoscopy or flexible sigmoidoscopy) you may notice spotting of blood in your stool or on the toilet paper. Some abdominal soreness may be present for a day or two, also. ? ?DIET: Your first meal following the procedure should be a light meal and then it is ok to progress to your normal diet. A half-sandwich or bowl of soup is an example of a good first meal. Heavy or fried foods are harder to digest and may make you feel nauseous or bloated. Drink plenty of fluids but you should avoid alcoholic beverages for 24 hours. If you had a esophageal dilation, please see attached instructions for diet.   ? ?ACTIVITY: Your care partner should take you home directly after the procedure. You should plan to take it easy, moving slowly for the rest of the day. You can resume normal activity the day after the procedure however YOU SHOULD NOT DRIVE, use power tools, machinery or perform tasks that involve climbing or major physical exertion for 24 hours (because of the sedation medicines used during the test).  ? ?SYMPTOMS TO REPORT IMMEDIATELY: ?A gastroenterologist can be reached at any hour. Please call [phone]  for any of the following symptoms:  ?Following lower endoscopy (colonoscopy, flexible sigmoidoscopy) ?Excessive  amounts of blood in the stool  ?Significant tenderness, worsening of abdominal pains  ?Swelling of the abdomen that is new, acute  ?Fever of 100? or higher  ?FOLLOW UP:  ?If any biopsies were taken you will be contacted by phone or by letter within the next 1-3 weeks. Call [phone]  if you have not heard about the biopsies in 3 weeks.  ?Please also call with any specific questions about appointments or follow up tests.  ?

## 2021-08-20 ENCOUNTER — Telehealth: Payer: Self-pay | Admitting: *Deleted

## 2021-08-20 DIAGNOSIS — R2681 Unsteadiness on feet: Secondary | ICD-10-CM | POA: Diagnosis not present

## 2021-08-20 DIAGNOSIS — M542 Cervicalgia: Secondary | ICD-10-CM | POA: Diagnosis not present

## 2021-08-20 DIAGNOSIS — M2560 Stiffness of unspecified joint, not elsewhere classified: Secondary | ICD-10-CM | POA: Diagnosis not present

## 2021-08-20 NOTE — Telephone Encounter (Signed)
?  Follow up Call- ? ? ?  08/18/2021  ? 10:26 AM  ?Call back number  ?Post procedure Call Back phone  # [phone]  ?Permission to leave phone message Yes  ?  ? ?Patient questions: ? ?Do you have a fever, pain , or abdominal swelling? No. ?Pain Score  0 * ? ?Have you tolerated food without any problems? Yes.   ? ?Have you been able to return to your normal activities? Yes.   ? ?Do you have any questions about your discharge instructions: ?Diet   No. ?Medications  No. ?Follow up visit  No. ? ?Do you have questions or concerns about your Care? No. ? ?Actions: ?* If pain score is 4 or above: ?No action needed, pain <4. ? ? ?

## 2021-08-25 DIAGNOSIS — R2681 Unsteadiness on feet: Secondary | ICD-10-CM | POA: Diagnosis not present

## 2021-08-25 DIAGNOSIS — M542 Cervicalgia: Secondary | ICD-10-CM | POA: Diagnosis not present

## 2021-08-25 DIAGNOSIS — M2560 Stiffness of unspecified joint, not elsewhere classified: Secondary | ICD-10-CM | POA: Diagnosis not present

## 2021-08-25 NOTE — Progress Notes (Addendum)
? ? ?ASSESSMENT AND PLAN ?76 y.o. year old male   ?1.  Mild cognitive impairment ?-Doing well, overall stable, patient and wife feel some improvement, MoCA 21/30 today ?-Will remain off Aricept, had side effect of dreams, improved with dosing AM, but overall decided to take a break ?-Continue Namenda 10 mg twice daily ?-MRI of the brain has shown microhemorrhage, generalized atrophy, mild supratentorium small vessel disease ?-Now on aspirin 81 mg daily as single agent (previously on aspirin and Plavix post stent placement)  ? ?2.  Restless leg syndrome ?-Continue Requip 1 mg twice daily ?-Continue Cymbalta 60 mg daily ? ?3.  Gait abnormality, bilateral lower extremity paresthesia, cervical spine stenosis ?-Seeing Dr. Patrice Paradise as scoliosis and spine center, currently undergoing physical therapy with good benefit, encouraged to continue this, continue exercise, activity ?-EMG/NCV March 2022 showed no evidence of neuropathy ?  ?DIAGNOSTIC DATA (LABS, IMAGING, TESTING) ? ?MRI of the brain on January 04 2020: Generalized atrophy, microhemorrhage, mild supratentorial and small vessel disease, no acute abnormality. ?  ?MRI scan cervical spine without contrast on August 05, 2020 showing prominent spondylitic changes from C3-C7 most prominent at C5-6 where there is moderate canal stenosis and bilateral foraminal narrowing with displacement of cord.  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 ? ?MRI thoracic spine in November 2022 showed no significant spinal stenosis, there was multilevel degenerative changes ? ?HISTORY OF PRESENT ILLNESS: ?Jeremy Simon is a 76 years old male, seen in request by his primary care physician Dr. Nona Dell, Corene Cornea for evaluation of neuropathy, initial evaluation was on December 04, 2018. ?  ?Past medical history of hypertension, hyperlipidemia, coronary artery disease, status post stent ?  ?Since 2019, he noticed frequent bilateral calf muscle deep achy pain,  especially at nighttime, difficulty falling to sleep, massage, pacing around did not help, he also complains of bilateral feet, toes tingling, he denies burning pain, denies gait abnormality, mild low back pain, denies significant radiating pain, denies bowel bladder incontinence, he does complains of frequent shoulder achy pain, denies upper extremity paresthesia or weakness, he also has occasionally bilateral posterior thigh muscle cramping ?  ?He was seen by [facility] orthopedic surgeon Dr.Sisco, reported EMG nerve conduction study showed evidence of peripheral neuropathy, he was given prescription of gabapentin 300 mg every night without significant improvement, ?  ?Laboratory evaluation in February 2020 showed normal CBC, CMP with exception of mild elevated glucose 116, normal TSH, LDL was 53 ?  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  ?He started Requip 1 mg 2 tablets every night since July 2020, not sure about the benefit, he can go to sleep without much difficulty, taking trazodone 50 mg every night to, but he often woke up at 4 AM, is hard for him to find a comfortable position, previously has tried gabapentin 300 mg every night without helping his symptoms. ?  ?Laboratory evaluation in July 2020 showed normal negative protein electrophoresis, vitamin D, ferritin, C-reactive protein, B12, ESR, RPR, ANA, A1c was 5.4 ?  ?He is also concerned about his mild memory loss, his mother suffered dementia in his 77s, occasionally slip of memory, highly functional has no difficulty handling his daily activity, today's Mini-Mental Status Examination is 30 out of 30. ?  ?MRI of lumbar in August 2020: Multilevel degenerative changes, hemangioma within L2, L4, no significant canal or foraminal narrowing  ? ?UPDATE July 28 2020: ?With change of his medication,  Requip 1 mg every night, he can sleep better, he complains of slow worsening bilateral  lower extremity weakness, increased bilateral feet numbness tingling, especially during the early morning, it is painful to take the first few steps, he has to be careful not to 4 ? ?He had few years history of slow worsening urinary incontinence, has frequent nocturnal accident, is treated with Flomax 0.4 mg daily, Myrbetriq 25 mg every night, he has to set up clock every 3 hours to remind him using bathroom in the middle of the sleep ? ?He had a coronary artery disease, status post stent April 2021, overlap with aspirin 81 mg plus Plavix 75 mg daily for 1 year, we will stop Plavix in May 2022 ? ?We personally reviewed MRI of brain without contrast, generalized atrophy, scattered microhemorrhage, mild supratentorial and small vessel disease. ? ?Laboratory evaluation on July 03, 2020, normal hemoglobin 14.4, CMP, creatinine of 0.9, A1c of 5.5, LDL of 63 ?  ?Update August 05, 2020 ?EMG nerve conduction study today is normal, there is no evidence of large fiber peripheral neuropathy, no evidence of right cervical radiculopathy ? ?Continue complains of worsening gait abnormality, brisk reflex especially at bilateral patella, intermittent bilateral hands paresthesia, also has worsening urinary incontinence, MRI of cervical spine to rule out cervical spondylitic myelopathy, pending for August 13, 2020 ? ?He sleeps well with Requip 1 mg 2 tablets every night, will try to decrease to 1 mg every night, continue has worsening urinary urgency, frequency, that is helped by Myrbetriq ? ?UPDATE August 19 2020: ?He is accompanied by his wife at today's clinical visit, continued complaints of lower extremity paresthesia, mildly unsteady gait urinary urgency, but denies incontinence ? ?Personally reviewed MRI of cervical spine on August 13, 2020, prominent spondylitic changes from C3-7, most prominent at C5-6, there is moderate canal stenosis bilateral foraminal narrowing, disc displacement of the cord, C4-5 also's showed significant  degenerative changes with moderate left-sided foraminal narrowing ? ?UPDATE Feb 27 2021: ?His restless leg symptoms has much improved,  denies difficulty sleeping, but he has to set up alarm clock to wake him up every 2 hours to use bathroom, few times each month, he would have urinary accident during sleep, sometimes he took his Requip 1 mg 2 tablets 5 AM when he wakes up early morning using bathroom, which seems to help his bilateral feet paresthesia, ? ?He is not very physically active, have low back pain, continue has mild gait abnormality, chronic constipation, ? ?Slow worsening memory loss, MoCA examination 22/30 today, ? ?Update August 26, 2021 SS: MOCA 21/30, stopped Aricept having strange dreams, did try in the AM, it did help the dreams, but decided to stop all together. He thinks memory is actually better, still short term memory is forgetful what he has already done, has calendar, reminders. Is more cooperative with wife's suggestions. Drives short distances, no issues. Went back on Requip 1 mg BID, it helps with tingling in LE. Sleeps well. No falls. Cymbalta 60 mg daily. On aspirin 81 mg alone. Still OAB, takes Myrbetriq, sets timer every 3 hours to urinate to prevent accidents. Has seen [facility], Dr. Patrice Paradise. Has started PT for his neck, 4 sessions, helping with neck loosening.  Going to [place] in 1 months.  ?MRI thoracic spine in November 2022 showed no significant stenosis, There was multilevel degenerative changes. He walks 1.5 miles daily. He knows all of his medications and the indications.  ? ?PHYSICAL EXAM ? ?Vitals:  ? 08/19/20  1447  ?BP: 138/65  ?Pulse: 61  ?Weight: 180 lb 8 oz (81.9 kg)  ?Height: 6' (1.829 m)  ? ?Body mass index is 24.48 kg/m?. ? ?Generalized: Well developed, in no acute distress  ? ? ?  08/26/2021  ?  9:07 AM 02/25/2021  ?  7:49 AM  ?Montreal Cognitive Assessment   ?Visuospatial/ Executive (0/5) 4 3  ?Naming (0/3) 3 3  ?Attention: Read list of digits (0/2) 2 2   ?Attention: Read list of letters (0/1) 1 1  ?Attention: Serial 7 subtraction starting at 100 (0/3) 3 3  ?Language: Repeat phrase (0/2) 1 2  ?Language : Fluency (0/1) 0 0  ?Abstraction (0/2) 1 2  ?Delayed Rec

## 2021-08-26 ENCOUNTER — Ambulatory Visit: Payer: Medicare Other | Admitting: Neurology

## 2021-08-26 ENCOUNTER — Encounter: Payer: Self-pay | Admitting: Neurology

## 2021-08-26 VITALS — BP 133/68 | HR 54 | Ht 72.0 in | Wt 181.0 lb

## 2021-08-26 DIAGNOSIS — G629 Polyneuropathy, unspecified: Secondary | ICD-10-CM

## 2021-08-26 DIAGNOSIS — G3184 Mild cognitive impairment, so stated: Secondary | ICD-10-CM

## 2021-08-26 DIAGNOSIS — M47812 Spondylosis without myelopathy or radiculopathy, cervical region: Secondary | ICD-10-CM | POA: Diagnosis not present

## 2021-08-26 MED ORDER — ROPINIROLE HCL 1 MG PO TABS: 1.0000 mg | ORAL_TABLET | Freq: Two times a day (BID) | ORAL | 3 refills | Status: DC

## 2021-08-26 NOTE — Patient Instructions (Signed)
Great to see you today ! ?Continue current medications, okay to stay off Aricept ?Encouraged to continue walking, physical therapy ?See you back in 6 months ?

## 2021-09-02 DIAGNOSIS — M2560 Stiffness of unspecified joint, not elsewhere classified: Secondary | ICD-10-CM | POA: Diagnosis not present

## 2021-09-02 DIAGNOSIS — M542 Cervicalgia: Secondary | ICD-10-CM | POA: Diagnosis not present

## 2021-09-02 DIAGNOSIS — R2681 Unsteadiness on feet: Secondary | ICD-10-CM | POA: Diagnosis not present

## 2021-09-06 DIAGNOSIS — M2560 Stiffness of unspecified joint, not elsewhere classified: Secondary | ICD-10-CM | POA: Diagnosis not present

## 2021-09-06 DIAGNOSIS — M542 Cervicalgia: Secondary | ICD-10-CM | POA: Diagnosis not present

## 2021-09-06 DIAGNOSIS — R2681 Unsteadiness on feet: Secondary | ICD-10-CM | POA: Diagnosis not present

## 2021-09-13 DIAGNOSIS — M2560 Stiffness of unspecified joint, not elsewhere classified: Secondary | ICD-10-CM | POA: Diagnosis not present

## 2021-09-13 DIAGNOSIS — M542 Cervicalgia: Secondary | ICD-10-CM | POA: Diagnosis not present

## 2021-09-13 DIAGNOSIS — R2681 Unsteadiness on feet: Secondary | ICD-10-CM | POA: Diagnosis not present

## 2021-10-06 DIAGNOSIS — M47812 Spondylosis without myelopathy or radiculopathy, cervical region: Secondary | ICD-10-CM | POA: Diagnosis not present

## 2021-10-06 DIAGNOSIS — M542 Cervicalgia: Secondary | ICD-10-CM | POA: Diagnosis not present

## 2021-10-06 DIAGNOSIS — R269 Unspecified abnormalities of gait and mobility: Secondary | ICD-10-CM | POA: Diagnosis not present

## 2021-11-02 DIAGNOSIS — M791 Myalgia, unspecified site: Secondary | ICD-10-CM | POA: Diagnosis not present

## 2021-11-02 DIAGNOSIS — M7918 Myalgia, other site: Secondary | ICD-10-CM | POA: Diagnosis not present

## 2021-11-02 DIAGNOSIS — M47812 Spondylosis without myelopathy or radiculopathy, cervical region: Secondary | ICD-10-CM | POA: Diagnosis not present

## 2021-11-02 DIAGNOSIS — M542 Cervicalgia: Secondary | ICD-10-CM | POA: Diagnosis not present

## 2021-11-04 DIAGNOSIS — M79605 Pain in left leg: Secondary | ICD-10-CM | POA: Diagnosis not present

## 2021-11-04 DIAGNOSIS — N400 Enlarged prostate without lower urinary tract symptoms: Secondary | ICD-10-CM | POA: Diagnosis not present

## 2021-11-04 DIAGNOSIS — M7989 Other specified soft tissue disorders: Secondary | ICD-10-CM | POA: Diagnosis not present

## 2021-11-04 DIAGNOSIS — M542 Cervicalgia: Secondary | ICD-10-CM | POA: Diagnosis not present

## 2021-11-22 DIAGNOSIS — M47812 Spondylosis without myelopathy or radiculopathy, cervical region: Secondary | ICD-10-CM | POA: Diagnosis not present

## 2021-12-27 DIAGNOSIS — M47812 Spondylosis without myelopathy or radiculopathy, cervical region: Secondary | ICD-10-CM | POA: Diagnosis not present

## 2021-12-28 DIAGNOSIS — N401 Enlarged prostate with lower urinary tract symptoms: Secondary | ICD-10-CM | POA: Diagnosis not present

## 2021-12-28 DIAGNOSIS — R351 Nocturia: Secondary | ICD-10-CM | POA: Diagnosis not present

## 2021-12-28 DIAGNOSIS — N529 Male erectile dysfunction, unspecified: Secondary | ICD-10-CM | POA: Diagnosis not present

## 2021-12-28 DIAGNOSIS — N3281 Overactive bladder: Secondary | ICD-10-CM | POA: Diagnosis not present

## 2022-01-13 DIAGNOSIS — R635 Abnormal weight gain: Secondary | ICD-10-CM | POA: Diagnosis not present

## 2022-01-13 DIAGNOSIS — R61 Generalized hyperhidrosis: Secondary | ICD-10-CM | POA: Diagnosis not present

## 2022-01-13 DIAGNOSIS — I10 Essential (primary) hypertension: Secondary | ICD-10-CM | POA: Diagnosis not present

## 2022-02-07 DIAGNOSIS — Z23 Encounter for immunization: Secondary | ICD-10-CM | POA: Diagnosis not present

## 2022-02-14 DIAGNOSIS — R413 Other amnesia: Secondary | ICD-10-CM | POA: Diagnosis not present

## 2022-02-14 DIAGNOSIS — N138 Other obstructive and reflux uropathy: Secondary | ICD-10-CM | POA: Diagnosis not present

## 2022-02-14 DIAGNOSIS — I10 Essential (primary) hypertension: Secondary | ICD-10-CM | POA: Diagnosis not present

## 2022-02-14 DIAGNOSIS — R3915 Urgency of urination: Secondary | ICD-10-CM | POA: Diagnosis not present

## 2022-02-14 DIAGNOSIS — R350 Frequency of micturition: Secondary | ICD-10-CM | POA: Diagnosis not present

## 2022-02-14 DIAGNOSIS — N401 Enlarged prostate with lower urinary tract symptoms: Secondary | ICD-10-CM | POA: Diagnosis not present

## 2022-02-16 DIAGNOSIS — M47812 Spondylosis without myelopathy or radiculopathy, cervical region: Secondary | ICD-10-CM | POA: Diagnosis not present

## 2022-02-28 ENCOUNTER — Ambulatory Visit: Payer: Medicare Other | Admitting: Neurology

## 2022-02-28 ENCOUNTER — Encounter: Payer: Self-pay | Admitting: Neurology

## 2022-02-28 VITALS — BP 135/67 | HR 54 | Ht 72.0 in | Wt 190.5 lb

## 2022-02-28 DIAGNOSIS — M47812 Spondylosis without myelopathy or radiculopathy, cervical region: Secondary | ICD-10-CM

## 2022-02-28 DIAGNOSIS — G3184 Mild cognitive impairment, so stated: Secondary | ICD-10-CM

## 2022-02-28 DIAGNOSIS — R269 Unspecified abnormalities of gait and mobility: Secondary | ICD-10-CM | POA: Diagnosis not present

## 2022-02-28 DIAGNOSIS — R351 Nocturia: Secondary | ICD-10-CM

## 2022-02-28 MED ORDER — DULOXETINE HCL 60 MG PO CPEP: 60.0000 mg | ORAL_CAPSULE | Freq: Every day | ORAL | 3 refills | Status: AC

## 2022-02-28 NOTE — Progress Notes (Signed)
ASSESSMENT AND PLAN 76 y.o. 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 Patrice Paradise as [facility], some improvement with physical therapy, recently had radiofrequency ablation, still complains of neck stiffness,  Suggested thermacare heating pad as needed.   DIAGNOSTIC DATA (LABS, IMAGING, TESTING)  MRI of the brain on January 04 2020: Generalized atrophy, microhemorrhage, mild supratentorial and small vessel disease, no acute abnormality.   MRI scan cervical spine without contrast on August 05, 2020 showing prominent spondylitic changes from C3-C7 most prominent at C5-6 where there is moderate canal stenosis and bilateral foraminal narrowing with displacement of cord.  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  MRI thoracic spine in November 2022 showed no significant spinal stenosis, there was multilevel degenerative changes  HISTORY OF PRESENT ILLNESS: Jeremy Simon is a 76 years old male, seen in request by his primary care physician Dr. Nona Dell, Corene Cornea for evaluation of neuropathy, initial evaluation was on December 04, 2018.   Past medical history of hypertension, hyperlipidemia, coronary artery  disease, status post stent   Since 2019, he noticed frequent bilateral calf muscle deep achy pain, especially at nighttime, difficulty falling to sleep, massage, pacing around did not help, he also complains of bilateral feet, toes tingling, he denies burning pain, denies gait abnormality, mild low back pain, denies significant radiating pain, denies bowel bladder incontinence, he does complains of frequent shoulder achy pain, denies upper extremity paresthesia or weakness, he also has occasionally bilateral posterior thigh muscle cramping  Laboratory evaluation in February 2020 showed normal CBC, CMP with exception of mild elevated glucose 116, normal TSH, LDL was 53   EMG nerve conduction study [facility] on July 20, 2018 showed bilateral axonal peripheral polyneuropathy   He started on Requip 1 mg 2 tablets every night since July 2020, not sure about the benefit, he can go to sleep without much difficulty, taking trazodone 50 mg every night too, but he often woke up at 4 AM, is hard for him to find a comfortable position, previously has tried gabapentin 300 mg every night without helping his symptoms.   Laboratory evaluation in July 2020 showed normal negative protein electrophoresis, vitamin D, ferritin, C-reactive protein, B12, ESR, RPR, ANA, A1c was 5.4   He is also concerned about his mild memory loss, his mother suffered dementia in his 73s, occasionally slip of memory, highly functional has no difficulty handling his daily activity, today's Mini-Mental Status Examination is 30 out of 30.   MRI of lumbar in August 2020: Multilevel degenerative changes, hemangioma within L2, L4, no significant canal or foraminal narrowing   UPDATE July 28 2020: With change of his medication, Requip 1 mg every night, he can sleep better, he complains of slow worsening bilateral lower extremity weakness, increased  bilateral feet numbness tingling, especially during the early morning, it is painful  to take the first few steps, he has to be careful not to 4  He had few years history of slow worsening urinary incontinence, has frequent nocturnal accident, is treated with Flomax 0.4 mg daily, Myrbetriq 25 mg every night, he has to set up clock every 3 hours to remind him using bathroom in the middle of the sleep  He had a coronary artery disease, status post stent April 2021, overlap with aspirin 81 mg plus Plavix 75 mg daily for 1 year, we will stop Plavix in May 2022  We personally reviewed MRI of brain without contrast, generalized atrophy, scattered microhemorrhage, mild supratentorial and small vessel disease.  Laboratory evaluation on July 03, 2020, normal hemoglobin 14.4, CMP, creatinine of 0.9, A1c of 5.5, LDL of 63   Update August 05, 2020 EMG nerve conduction study today is normal, there is no evidence of large fiber peripheral neuropathy, no evidence of right cervical radiculopathy  Continue complains of worsening gait abnormality, brisk reflex especially at bilateral patella, intermittent bilateral hands paresthesia, also has worsening urinary incontinence, MRI of cervical spine to rule out cervical spondylitic myelopathy, pending for August 13, 2020  He sleeps well with Requip 1 mg 2 tablets every night, will try to decrease to 1 mg every night, continue has worsening urinary urgency, frequency, that is helped by Myrbetriq  UPDATE August 19 2020: He is accompanied by his wife at today's clinical visit, continued complaints of lower extremity paresthesia, mildly unsteady gait urinary urgency, but denies incontinence  Personally reviewed MRI of cervical spine on August 13, 2020, prominent spondylitic changes from C3-7, most prominent at C5-6, there is moderate canal stenosis bilateral foraminal narrowing, disc displacement of the cord, C4-5 also's showed significant degenerative changes with moderate left-sided foraminal narrowing  UPDATE Feb 27 2021: His restless leg symptoms has  much improved,  denies difficulty sleeping, but he has to set up alarm clock to wake him up every 2 hours to use bathroom, few times each month, he would have urinary accident during sleep, sometimes he took his Requip 1 mg 2 tablets 5 AM when he wakes up early morning using bathroom, which seems to help his bilateral feet paresthesia,  He is not very physically active, have low back pain, continue has mild gait abnormality, chronic constipation,  Slow worsening memory loss, MoCA examination 22/30 today,  UPDATE Feb 28 2022: He is accompanied by his wife at today's clinical visit, overall stable, MoCA examination 21/30, tolerating Namenda at 10 mg twice a day  He was seen by scoliosis clinic February 16, 2022 had a radiofrequency ablation, still has neck stiffness, taking Norco 50 mg twice a day as needed  He sleeps well compared to previous, taking Requip 1 mg every night, falling to sleep quickly, but set her alarm every 2 hours for urination, worried about the nocturnal enuresis  He denies lower snoring, wife noticed that he has body tremor excessive movement occasionally,   PHYSICAL EXAM  Vitals:   08/19/20 1447  BP: 138/65  Pulse: 61  Weight: 180 lb 8 oz (81.9 kg)  Height: 6' (1.829 m)   Body mass index is 24.48 kg/m.  Generalized: Well developed, in no acute distress      02/28/2022   10:04 AM 08/26/2021    9:07 AM 02/25/2021    7:49 AM  Montreal Cognitive Assessment   Visuospatial/ Executive (0/5) 4 4 3   Naming (0/3) 3 3  3  Attention: Read list of digits (0/2) 2 2 2   Attention: Read list of letters (0/1) 1 1 1   Attention: Serial 7 subtraction starting at 100 (0/3) 3 3 3   Language: Repeat phrase (0/2) 1 1 2   Language : Fluency (0/1) 0 0 0  Abstraction (0/2) 1 1 2   Delayed Recall (0/5) 0 0 0  Orientation (0/6) 6 6 6   Total 21 21 22   Adjusted Score (based on education) 21 21 22       PHYSICAL EXAMNIATION:  Gen: NAD, conversant, well nourised, well groomed                      Cardiovascular: Regular rate rhythm, no peripheral edema, warm, nontender. Eyes: Conjunctivae clear without exudates or hemorrhage Neck: Supple, no carotid bruits. Pulmonary: Clear to auscultation bilaterally   NEUROLOGICAL EXAM:  MENTAL STATUS: Speech/cognition: Awake, alert oriented to history taking and casual conversation  CRANIAL NERVES: CN II: Visual fields are full to confrontation.  Pupils are round equal and briskly reactive to light. CN III, IV, VI: extraocular movement are normal. No ptosis. CN V: Facial sensation is intact to pinprick in all 3 divisions bilaterally. Corneal responses are intact.  CN VII: Face is symmetric with normal eye closure and smile. CN VIII: Hearing is normal to casual conversation CN IX, X: Palate elevates symmetrically. Phonation is normal. CN XI: Head turning and shoulder shrug are intact CN XII: Tongue is midline with normal movements and no atrophy.  MOTOR: There is no pronator drift of out-stretched arms. Muscle bulk and tone are normal. Muscle strength is normal.  REFLEXES: Reflexes are 1 and symmetric at the biceps, triceps, 2/2knees, absent at ankles. Plantar responses are flexor.  SENSORY: Intact to light touch, pinprick, positional and vibratory sensation are intact in fingers and toes.  COORDINATION: Rapid alternating movements and fine finger movements are intact. There is no dysmetria on finger-to-nose and heel-knee-shin.    GAIT/STANCE: Need push-up to get up from seated position, cautious, mildly unsteady    REVIEW OF SYSTEMS: Out of a complete 14 system review of symptoms, the patient complains only of the following symptoms, and all other reviewed systems are negative.  See HPI  ALLERGIES: Allergies  Allergen Reactions   Altace [Ramipril]     Hives, lip swelling   Norvasc [Amlodipine] Swelling    Left foot swelling    HOME MEDICATIONS: Outpatient Medications Prior to Visit  Medication Sig Dispense  Refill   acetaminophen (TYLENOL) 500 MG tablet Take 1,000 mg by mouth every 6 (six) hours as needed for mild pain or moderate pain.      aspirin EC 81 MG tablet Take 81 mg by mouth as needed. Swallow whole.     atorvastatin (LIPITOR) 80 MG tablet Take 1 tablet (80 mg total) by mouth daily. 90 tablet 3   Azelastine HCl 137 MCG/SPRAY SOLN Place into both nostrils.     calcium carbonate (TUMS EX) 750 MG chewable tablet Chew 1 tablet by mouth daily.     DULoxetine (CYMBALTA) 60 MG capsule Take 1 capsule (60 mg total) by mouth daily. 90 capsule 3   memantine (NAMENDA) 10 MG tablet Take 1 tablet (10 mg total) by mouth 2 (two) times daily. 180 tablet 3   METAMUCIL FIBER PO Take by mouth.     metoprolol succinate (TOPROL-XL) 25 MG 24 hr tablet TAKE 1 TABLET BY MOUTH EVERY DAY 90 tablet 3   MYRBETRIQ 50 MG TB24 tablet Take 50  mg by mouth daily.     nitroGLYCERIN (NITROSTAT) 0.4 MG SL tablet Place 1 tablet (0.4 mg total) under the tongue every 5 (five) minutes as needed. 25 tablet 2   Polyethylene Glycol 3350 (MIRALAX PO) Take by mouth.     rOPINIRole (REQUIP) 1 MG tablet Take 1 tablet (1 mg total) by mouth 2 (two) times daily. 180 tablet 3   tamsulosin (FLOMAX) 0.4 MG CAPS capsule Take 0.4 mg by mouth daily.     hydrocortisone (ANUSOL-HC) 2.5 % rectal cream Place 1 application. rectally 2 (two) times daily as needed for hemorrhoids or anal itching. 30 g 2   No facility-administered medications prior to visit.    PAST MEDICAL HISTORY: Past Medical History:  Diagnosis Date   Anxiety    Arthritis    CAD    Erectile dysfunction    HIATAL HERNIA    HYPERLIPIDEMIA    HYPERTENSION    Numbness in feet     PAST SURGICAL HISTORY: Past Surgical History:  Procedure Laterality Date   CARDIAC CATHETERIZATION     COLONOSCOPY  12/31/2014   Minimal internal hemorrhoids. Otherwise normal colonoscopy   CORONARY STENT INTERVENTION N/A 08/14/2019   Procedure: CORONARY STENT INTERVENTION;  Surgeon:  Burnell Blanks, MD;  Location: [facility];  Service: Cardiovascular;  Laterality: N/A;   LEFT HEART CATH AND CORONARY ANGIOGRAPHY N/A 08/14/2019   Procedure: LEFT HEART CATH AND CORONARY ANGIOGRAPHY;  Surgeon: Burnell Blanks, MD;  Location: [facility];  Service: Cardiovascular;  Laterality: N/A;   NOSE SURGERY     TRANSURETHRAL RESECTION OF PROSTATE  03/2017    FAMILY HISTORY: Family History  Problem Relation Age of Onset   Alzheimer's disease Mother        SOME TYPE OF VALUE DISORDER   Heart attack Father    Hypertension Father    Obesity Father    Hypertension Sister    Diabetes Sister    Obesity Sister    Other Daughter        NO HEALTH PROBLEMS   Other Son        NO HEALTH PROBLEMS   Colon cancer Neg Hx    Rectal cancer Neg Hx    Stomach cancer Neg Hx    Esophageal cancer Neg Hx    Pancreatic cancer Neg Hx    Liver cancer Neg Hx     SOCIAL HISTORY: Social History   Socioeconomic History   Marital status: Married    Spouse name: Not on file   Number of children: Not on file   Years of education: Not on file   Highest education level: Not on file  Occupational History   Not on file  Tobacco Use   Smoking status: Never   Smokeless tobacco: Never  Vaping Use   Vaping Use: Never used  Substance and Sexual Activity   Alcohol use: No    Alcohol/week: 0.0 standard drinks of alcohol   Drug use: No   Sexual activity: Not on file  Other Topics Concern   Not on file  Social History Narrative   Lives   Caffeine use:    Social Determinants of Health   Financial Resource Strain: Not on file  Food Insecurity: Not on file  Transportation Needs: Not on file  Physical Activity: Not on file  Stress: Not on file  Social Connections: Not on file  Intimate Partner Violence: Not on file    Marcial Pacas, M.D. Ph.D.  [facility] [place], [place] [address] Phone: [phone] Fax:      [phone]

## 2022-04-18 ENCOUNTER — Ambulatory Visit: Payer: Medicare Other | Attending: Nurse Practitioner | Admitting: Nurse Practitioner

## 2022-04-18 ENCOUNTER — Encounter: Payer: Self-pay | Admitting: Nurse Practitioner

## 2022-04-18 VITALS — BP 138/70 | HR 61 | Ht 72.0 in | Wt 196.2 lb

## 2022-04-18 DIAGNOSIS — E785 Hyperlipidemia, unspecified: Secondary | ICD-10-CM

## 2022-04-18 DIAGNOSIS — I10 Essential (primary) hypertension: Secondary | ICD-10-CM | POA: Diagnosis not present

## 2022-04-18 DIAGNOSIS — I2510 Atherosclerotic heart disease of native coronary artery without angina pectoris: Secondary | ICD-10-CM

## 2022-04-18 NOTE — Progress Notes (Signed)
Cardiology Clinic Note   Patient Name: Jeremy Simon Date of Encounter: 04/18/2022  Primary Care Provider:  Buckner Malta, MD Primary Cardiologist:  Olga Millers, MD  Patient Profile    76 year old male with a history of CAD, hypertension, hyperlipidemia, ED, and arthritis who presents for follow-up related to CAD.  Past Medical History    Past Medical History:  Diagnosis Date   Anxiety    Arthritis    CAD    Erectile dysfunction    HIATAL HERNIA    HYPERLIPIDEMIA    HYPERTENSION    Numbness in feet    Past Surgical History:  Procedure Laterality Date   CARDIAC CATHETERIZATION     COLONOSCOPY  12/31/2014   Minimal internal hemorrhoids. Otherwise normal colonoscopy   CORONARY STENT INTERVENTION N/A 08/14/2019   Procedure: CORONARY STENT INTERVENTION;  Surgeon: Kathleene Hazel, MD;  Location: [facility];  Service: Cardiovascular;  Laterality: N/A;   LEFT HEART CATH AND CORONARY ANGIOGRAPHY N/A 08/14/2019   Procedure: LEFT HEART CATH AND CORONARY ANGIOGRAPHY;  Surgeon: Kathleene Hazel, MD;  Location: [facility];  Service: Cardiovascular;  Laterality: N/A;   NOSE SURGERY     TRANSURETHRAL RESECTION OF PROSTATE  03/2017    Allergies  Allergies  Allergen Reactions   Altace [Ramipril]     Hives, lip swelling   Norvasc [Amlodipine] Swelling    Left foot swelling    History of Present Illness    76 year old male with the above past medical history including CAD, hypertension, hyperlipidemia, ED, and arthritis.  He has a history of prior PCI-LAD in 2000.  Cath in 08/2019 showed 100% OM, 99% o-pLAD, 60% p-mLAD s/p DES x2. He was last seen in the office on 04/21/2022 and was stable from a cardiac standpoint. He denied symptoms concerning for angina.   He presents today for follow-up accompanied by his wife. Since his last visit he has been stable from a cardiac standpoint. He is active and walks regularly.  He denies symptoms  concerning for angina.  He did recently travel to [place] on a bus tour and notes some mild nonpitting bilateral lower extremity edema in the setting of increased sitting and dietary indiscretion.  He denies dyspnea, PND, orthopnea, significant weight gain. He notes occasional tingling in his feet, denies claudication.  He is following with a spine specialist for neck pain and chronic headaches. Overall, he reports feeling well.  Home Medications    Current Outpatient Medications  Medication Sig Dispense Refill   acetaminophen (TYLENOL) 500 MG tablet Take 500 mg by mouth every 6 (six) hours as needed for mild pain or moderate pain.     aspirin EC 81 MG tablet Take 81 mg by mouth daily. Swallow whole.     atorvastatin (LIPITOR) 80 MG tablet Take 1 tablet (80 mg total) by mouth daily. 90 tablet 3   Azelastine HCl 137 MCG/SPRAY SOLN Place into both nostrils. AS NEEDED     calcium carbonate (TUMS EX) 750 MG chewable tablet Chew 1 tablet by mouth daily.     DULoxetine (CYMBALTA) 60 MG capsule Take 1 capsule (60 mg total) by mouth daily. 90 capsule 3   memantine (NAMENDA) 10 MG tablet Take 1 tablet (10 mg total) by mouth 2 (two) times daily. 180 tablet 3   METAMUCIL FIBER PO Take by mouth.     metoprolol succinate (TOPROL-XL) 25 MG 24 hr tablet TAKE 1 TABLET BY MOUTH EVERY DAY 90 tablet 3  MYRBETRIQ 50 MG TB24 tablet Take 50 mg by mouth daily.     nitroGLYCERIN (NITROSTAT) 0.4 MG SL tablet Place 1 tablet (0.4 mg total) under the tongue every 5 (five) minutes as needed. 25 tablet 2   Polyethylene Glycol 3350 (MIRALAX PO) Take by mouth.     rOPINIRole (REQUIP) 1 MG tablet Take 1 tablet (1 mg total) by mouth 2 (two) times daily. 180 tablet 3   tamsulosin (FLOMAX) 0.4 MG CAPS capsule Take 0.4 mg by mouth daily.     No current facility-administered medications for this visit.    Review of Systems    He denies chest pain, palpitations, dyspnea, pnd, orthopnea, n, v, dizziness, syncope, weight gain, or  early satiety. All other systems reviewed and are otherwise negative except as noted above.   Physical Exam    VS:  BP 138/70   Pulse 61   Ht 6' (1.829 m)   Wt 196 lb 3.2 oz (89 kg)   SpO2 96%   BMI 26.61 kg/m   GEN: Well nourished, well developed, in no acute distress. HEENT: normal. Neck: Supple, no JVD, carotid bruits, or masses. Cardiac: RRR, no murmurs, rubs, or gallops. No clubbing, cyanosis, edema.  Radials/DP/PT 2+ and equal bilaterally.  Respiratory:  Respirations regular and unlabored, clear to auscultation bilaterally. GI: Soft, nontender, nondistended, BS + x 4. MS: no deformity or atrophy. Skin: warm and dry, no rash. Neuro:  Strength and sensation are intact. Psych: Normal affect.  Accessory Clinical Findings    ECG personally reviewed by me today-NSR, 61 bpm- No acute changes  Lab Results  Component Value Date   WBC 7.5 08/07/2019   HGB 12.9 (L) 08/07/2019   HCT 38.1 08/07/2019   MCV 93 08/07/2019   PLT 182 08/07/2019   Lab Results  Component Value Date   CREATININE 0.85 09/05/2019   BUN 16 09/05/2019   NA 141 09/05/2019   K 4.9 09/05/2019   CL 104 09/05/2019   CO2 25 09/05/2019   Lab Results  Component Value Date   ALT 20 10/28/2019   AST 23 10/28/2019   ALKPHOS 74 10/28/2019   BILITOT 0.8 10/28/2019   Lab Results  Component Value Date   CHOL 134 10/28/2019   HDL 53 10/28/2019   LDLCALC 60 10/28/2019   TRIG 121 10/28/2019   CHOLHDL 2.5 10/28/2019    Lab Results  Component Value Date   HGBA1C 5.4 12/04/2018    Assessment & Plan   1. CAD: S/p DES-LAD in 2000, DES x 2-LAD in 06/29/2019. Stable with no anginal symptoms. No indication for ischemic evaluation.  Continue aspirin, metoprolol, Lipitor.  2. Hypertension: BP well controlled. Continue current antihypertensive regimen.   3. Hyperlipidemia: LDL was 51 in 07/2021.  Currently managed per PCP.  Continue aspirin, Lipitor.  4. Disposition: Follow-up in 1 year.  Joylene Grapes,  NP 04/18/2022, 8:32 AM

## 2022-04-18 NOTE — Patient Instructions (Signed)
Medication Instructions:  Your physician recommends that you continue on your current medications as directed. Please refer to the Current Medication list given to you today.  *If you need a refill on your cardiac medications before your next appointment, please call your pharmacy*  Lab Work: NONE ordered at this time of appointment   If you have labs (blood work) drawn today and your tests are completely normal, you will receive your results only by: MyChart Message (if you have MyChart) OR A paper copy in the mail If you have any lab test that is abnormal or we need to change your treatment, we will call you to review the results.  Testing/Procedures: NONE ordered at this time of appointment   Follow-Up: At [facility], you and your health needs are our priority.  As part of our continuing mission to provide you with exceptional heart care, we have created designated Provider Care Teams.  These Care Teams include your primary Cardiologist (physician) and Advanced Practice Providers (APPs -  Physician Assistants and Nurse Practitioners) who all work together to provide you with the care you need, when you need it.  We recommend signing up for the patient portal called "MyChart".  Sign up information is provided on this After Visit Summary.  MyChart is used to connect with patients for Virtual Visits (Telemedicine).  Patients are able to view lab/test results, encounter notes, upcoming appointments, etc.  Non-urgent messages can be sent to your provider as well.   To learn more about what you can do with MyChart, go to [email or web address].    Your next appointment:   1 year(s)  The format for your next appointment:   In Person  Provider:   Brian Crenshaw, MD     Other Instructions   Important Information About Sugar       

## 2022-04-21 ENCOUNTER — Other Ambulatory Visit: Payer: Self-pay | Admitting: Neurology

## 2022-04-26 DIAGNOSIS — F0280 Dementia in other diseases classified elsewhere without behavioral disturbance: Secondary | ICD-10-CM | POA: Diagnosis not present

## 2022-04-26 DIAGNOSIS — N401 Enlarged prostate with lower urinary tract symptoms: Secondary | ICD-10-CM | POA: Diagnosis not present

## 2022-04-26 DIAGNOSIS — N138 Other obstructive and reflux uropathy: Secondary | ICD-10-CM | POA: Diagnosis not present

## 2022-04-26 DIAGNOSIS — G309 Alzheimer's disease, unspecified: Secondary | ICD-10-CM | POA: Diagnosis not present

## 2022-04-27 DIAGNOSIS — N401 Enlarged prostate with lower urinary tract symptoms: Secondary | ICD-10-CM | POA: Diagnosis not present

## 2022-05-23 DIAGNOSIS — F0280 Dementia in other diseases classified elsewhere without behavioral disturbance: Secondary | ICD-10-CM | POA: Diagnosis not present

## 2022-05-23 DIAGNOSIS — N401 Enlarged prostate with lower urinary tract symptoms: Secondary | ICD-10-CM | POA: Diagnosis not present

## 2022-05-23 DIAGNOSIS — I739 Peripheral vascular disease, unspecified: Secondary | ICD-10-CM | POA: Diagnosis not present

## 2022-05-23 DIAGNOSIS — R209 Unspecified disturbances of skin sensation: Secondary | ICD-10-CM | POA: Diagnosis not present

## 2022-05-23 DIAGNOSIS — G309 Alzheimer's disease, unspecified: Secondary | ICD-10-CM | POA: Diagnosis not present

## 2022-06-17 DIAGNOSIS — R3915 Urgency of urination: Secondary | ICD-10-CM | POA: Diagnosis not present

## 2022-06-17 DIAGNOSIS — R350 Frequency of micturition: Secondary | ICD-10-CM | POA: Diagnosis not present

## 2022-06-22 DIAGNOSIS — Z961 Presence of intraocular lens: Secondary | ICD-10-CM | POA: Diagnosis not present

## 2022-07-20 DIAGNOSIS — E785 Hyperlipidemia, unspecified: Secondary | ICD-10-CM | POA: Diagnosis not present

## 2022-07-20 DIAGNOSIS — G309 Alzheimer's disease, unspecified: Secondary | ICD-10-CM | POA: Diagnosis not present

## 2022-07-20 DIAGNOSIS — I739 Peripheral vascular disease, unspecified: Secondary | ICD-10-CM | POA: Diagnosis not present

## 2022-07-20 DIAGNOSIS — Z79899 Other long term (current) drug therapy: Secondary | ICD-10-CM | POA: Diagnosis not present

## 2022-07-20 DIAGNOSIS — F0280 Dementia in other diseases classified elsewhere without behavioral disturbance: Secondary | ICD-10-CM | POA: Diagnosis not present

## 2022-07-20 DIAGNOSIS — R7302 Impaired glucose tolerance (oral): Secondary | ICD-10-CM | POA: Diagnosis not present

## 2022-07-20 DIAGNOSIS — Z125 Encounter for screening for malignant neoplasm of prostate: Secondary | ICD-10-CM | POA: Diagnosis not present

## 2022-07-20 DIAGNOSIS — Z0000 Encounter for general adult medical examination without abnormal findings: Secondary | ICD-10-CM | POA: Diagnosis not present

## 2022-08-10 DIAGNOSIS — R55 Syncope and collapse: Secondary | ICD-10-CM | POA: Diagnosis not present

## 2022-08-10 DIAGNOSIS — R001 Bradycardia, unspecified: Secondary | ICD-10-CM | POA: Diagnosis not present

## 2022-08-10 DIAGNOSIS — R29818 Other symptoms and signs involving the nervous system: Secondary | ICD-10-CM | POA: Diagnosis not present

## 2022-08-10 DIAGNOSIS — I2510 Atherosclerotic heart disease of native coronary artery without angina pectoris: Secondary | ICD-10-CM | POA: Diagnosis not present

## 2022-08-10 DIAGNOSIS — R7989 Other specified abnormal findings of blood chemistry: Secondary | ICD-10-CM | POA: Diagnosis not present

## 2022-08-10 DIAGNOSIS — F418 Other specified anxiety disorders: Secondary | ICD-10-CM | POA: Diagnosis not present

## 2022-08-10 DIAGNOSIS — I10 Essential (primary) hypertension: Secondary | ICD-10-CM | POA: Diagnosis not present

## 2022-08-10 DIAGNOSIS — J439 Emphysema, unspecified: Secondary | ICD-10-CM | POA: Diagnosis not present

## 2022-08-10 DIAGNOSIS — Z79899 Other long term (current) drug therapy: Secondary | ICD-10-CM | POA: Diagnosis not present

## 2022-08-10 DIAGNOSIS — Z23 Encounter for immunization: Secondary | ICD-10-CM | POA: Diagnosis not present

## 2022-08-10 DIAGNOSIS — S022XXA Fracture of nasal bones, initial encounter for closed fracture: Secondary | ICD-10-CM | POA: Diagnosis not present

## 2022-08-10 DIAGNOSIS — W19XXXA Unspecified fall, initial encounter: Secondary | ICD-10-CM | POA: Diagnosis not present

## 2022-08-11 DIAGNOSIS — I6523 Occlusion and stenosis of bilateral carotid arteries: Secondary | ICD-10-CM | POA: Diagnosis not present

## 2022-08-11 DIAGNOSIS — R55 Syncope and collapse: Secondary | ICD-10-CM | POA: Diagnosis not present

## 2022-08-11 DIAGNOSIS — E785 Hyperlipidemia, unspecified: Secondary | ICD-10-CM | POA: Diagnosis not present

## 2022-08-11 DIAGNOSIS — R7989 Other specified abnormal findings of blood chemistry: Secondary | ICD-10-CM | POA: Diagnosis not present

## 2022-08-11 DIAGNOSIS — R079 Chest pain, unspecified: Secondary | ICD-10-CM | POA: Diagnosis not present

## 2022-08-11 DIAGNOSIS — W19XXXA Unspecified fall, initial encounter: Secondary | ICD-10-CM | POA: Diagnosis not present

## 2022-08-12 DIAGNOSIS — R55 Syncope and collapse: Secondary | ICD-10-CM | POA: Diagnosis not present

## 2022-08-12 DIAGNOSIS — R7989 Other specified abnormal findings of blood chemistry: Secondary | ICD-10-CM | POA: Diagnosis not present

## 2022-08-12 DIAGNOSIS — R079 Chest pain, unspecified: Secondary | ICD-10-CM | POA: Diagnosis not present

## 2022-08-12 DIAGNOSIS — W19XXXA Unspecified fall, initial encounter: Secondary | ICD-10-CM | POA: Diagnosis not present

## 2022-08-12 DIAGNOSIS — R0602 Shortness of breath: Secondary | ICD-10-CM | POA: Diagnosis not present

## 2022-08-13 ENCOUNTER — Encounter: Payer: Self-pay | Admitting: Neurology

## 2022-08-13 ENCOUNTER — Encounter: Payer: Self-pay | Admitting: Cardiology

## 2022-08-16 ENCOUNTER — Telehealth: Payer: Self-pay

## 2022-08-16 NOTE — Progress Notes (Signed)
Cardiology Clinic Note   Patient Name: Jeremy Simon Date of Encounter: 08/19/2022  Primary Care Provider:  Buckner MaltaBurgart, Jennifer, Simon Primary Cardiologist:  Jeremy MillersBrian Crenshaw, Simon  Patient Profile    77 year old male with a history of CAD prior PCI-LAD in 2000.  Cath in 08/2019 showed 100% OM, 99% o-pLAD, 60% p-mLAD s/p DES x2. , hypertension, hyperlipidemia, ED, and chronic neck arthritis.  Was last seen in the office by Jeremy PersonEmily Monge, DNP, on 04/18/2022.  Was stable from a cardiac standpoint blood pressure was well-controlled and he was continued on GDMT.  Unfortunately, Mr. Jeremy Simon was admitted [facility] on on 08/10/2022, after a syncopal episode.  On evaluation in ED he denied any lightheadedness dizziness chest pain or shortness of breath or associated palpitations.  He has a history of frequent falls over the last several months.  He also complained of getting very fatigued and short of breath walking distances which she is normally able to do without difficulty.  He sustained a wrist fracture of the left nasal bone.  During admission patient's troponins were found to be negative, a stress test was performed which was low risk with no evidence of reversible ischemia.  However, the patient was noted to have periods of bradycardia with heart rates in the mid to upper 50s but was asymptomatic.  He was recommended for an outpatient cardiac monitor for further evaluation.  He was also to follow-up with neurologist on April 24 due to gait disturbance and tremors.  He was to use a walker for ambulation.  The patient was discharged on 08/12/2022.  Past Medical History    Past Medical History:  Diagnosis Date   Anxiety    Arthritis    CAD    Erectile dysfunction    HIATAL HERNIA    HYPERLIPIDEMIA    HYPERTENSION    Numbness in feet    Past Surgical History:  Procedure Laterality Date   CARDIAC CATHETERIZATION     COLONOSCOPY  12/31/2014   Minimal internal hemorrhoids. Otherwise  normal colonoscopy   CORONARY STENT INTERVENTION N/A 08/14/2019   Procedure: CORONARY STENT INTERVENTION;  Surgeon: Jeremy HazelMcAlhany, Jeremy Simon;  Location: [facility] INVASIVE CV LAB;  Service: Cardiovascular;  Laterality: N/A;   LEFT HEART CATH AND CORONARY ANGIOGRAPHY N/A 08/14/2019   Procedure: LEFT HEART CATH AND CORONARY ANGIOGRAPHY;  Surgeon: Jeremy HazelMcAlhany, Jeremy Simon;  Location: [facility] INVASIVE CV LAB;  Service: Cardiovascular;  Laterality: N/A;   NOSE SURGERY     TRANSURETHRAL RESECTION OF PROSTATE  03/2017    Allergies  Allergies  Allergen Reactions   Altace [Ramipril]     Hives, lip swelling   Norvasc [Amlodipine] Swelling    Left foot swelling    History of Present Illness    Mr. Jeremy Simon presents today for ongoing assessment and management of coronary artery disease as outlined above.  He is status [facility] due to syncopal episode and fracture of the left nasal bone as result.  He was found to have some bradycardia during hospitalization but was asymptomatic.  Lowest heart rate was found to be in the 50s.  He is recommended for an outpatient cardiac monitor.  He had noticed that he was having more dyspnea on exertion and fatigue walking short distances which were different to previous exercise level with decline.  He also was noted to have some tremors and gait disturbances and was to follow-up with neurology.    The patient is accompanied by his wife and  who has concerns about his gait more than anything else.  She states he is not shuffling but is taking small steps rapidly which tends to wear him out.  The patient appears to have some cognitive deficits and trouble remembering at times however he was very adamant that he did not pass out simply became very fatigued and fell.   Home Medications    Current Outpatient Medications  Medication Sig Dispense Refill   acetaminophen (TYLENOL) 500 MG tablet Take 500 mg by mouth every 6 (six) hours as needed for  mild pain or moderate pain.     aspirin EC 81 MG tablet Take 81 mg by mouth daily. Swallow whole.     atorvastatin (LIPITOR) 80 MG tablet Take 1 tablet (80 mg total) by mouth daily. 90 tablet 3   Azelastine HCl 137 MCG/SPRAY SOLN Place into both nostrils. AS NEEDED     calcium carbonate (TUMS EX) 750 MG chewable tablet Chew 1 tablet by mouth daily.     Cholecalciferol 1.25 MG (50000 UT) capsule Take 50,000 Units by mouth daily.     DULoxetine (CYMBALTA) 60 MG capsule Take 1 capsule (60 mg total) by mouth daily. 90 capsule 3   fluticasone (FLONASE) 50 MCG/ACT nasal spray Place 2 sprays into both nostrils daily.     memantine (NAMENDA) 10 MG tablet TAKE 1 TABLET TWICE A DAY 180 tablet 3   METAMUCIL FIBER PO Take by mouth.     metoprolol succinate (TOPROL-XL) 25 MG 24 hr tablet TAKE 1 TABLET BY MOUTH EVERY DAY 90 tablet 3   MYRBETRIQ 50 MG TB24 tablet Take 50 mg by mouth daily.     Polyethylene Glycol 3350 (MIRALAX PO) Take by mouth.     tamsulosin (FLOMAX) 0.4 MG CAPS capsule Take 0.4 mg by mouth daily.     nitroGLYCERIN (NITROSTAT) 0.4 MG SL tablet Place 1 tablet (0.4 mg total) under the tongue every 5 (five) minutes as needed. (Patient not taking: Reported on 08/19/2022) 25 tablet 2   rOPINIRole (REQUIP) 1 MG tablet Take 1 tablet (1 mg total) by mouth 2 (two) times daily. (Patient not taking: Reported on 08/19/2022) 180 tablet 3   No current facility-administered medications for this visit.     Family History    Family History  Problem Relation Age of Onset   Alzheimer's disease Mother        SOME TYPE OF VALUE DISORDER   Heart attack Father    Hypertension Father    Obesity Father    Hypertension Sister    Diabetes Sister    Obesity Sister    Other Daughter        NO HEALTH PROBLEMS   Other Son        NO HEALTH PROBLEMS   Colon cancer Neg Hx    Rectal cancer Neg Hx    Stomach cancer Neg Hx    Esophageal cancer Neg Hx    Pancreatic cancer Neg Hx    Liver cancer Neg Hx    He  indicated that his mother is deceased. He indicated that his father is deceased. He indicated that only one of his four sisters is alive. He indicated that his maternal grandmother is deceased. He indicated that his maternal grandfather is deceased. He indicated that his paternal grandmother is deceased. He indicated that his paternal grandfather is deceased. He indicated that only one of his two daughters is alive. He indicated that only one of his two sons is alive. He indicated  that the status of his neg hx is unknown.  Social History    Social History   Socioeconomic History   Marital status: Married    Spouse name: Not on file   Number of children: Not on file   Years of education: Not on file   Highest education level: Not on file  Occupational History   Not on file  Tobacco Use   Smoking status: Never   Smokeless tobacco: Never  Vaping Use   Vaping Use: Never used  Substance and Sexual Activity   Alcohol use: No    Alcohol/week: 0.0 standard drinks of alcohol   Drug use: No   Sexual activity: Not on file  Other Topics Concern   Not on file  Social History Narrative   Lives   Caffeine use:    Social Determinants of Health   Financial Resource Strain: Not on file  Food Insecurity: Not on file  Transportation Needs: Not on file  Physical Activity: Not on file  Stress: Not on file  Social Connections: Not on file  Intimate Partner Violence: Not on file     Review of Systems    General:  No chills, fever, night sweats or weight changes.  Cardiovascular:  No chest pain, dyspnea on exertion, edema, orthopnea, palpitations, paroxysmal nocturnal dyspnea. Dermatological: No rash, lesions/masses Respiratory: No cough, dyspnea Urologic: No hematuria, dysuria Abdominal:   No nausea, vomiting, diarrhea, bright red blood per rectum, melena, or hematemesis Neurologic:  No visual changes, wkns, changes in mental status. All other systems reviewed and are otherwise negative  except as noted above.     Physical Exam    VS:  BP 132/84   Pulse 61   Ht 6' (1.829 m)   Wt 189 lb 6.4 oz (85.9 kg)   SpO2 97%   BMI 25.69 kg/m  , BMI Body mass index is 25.69 kg/m.     GEN: Well nourished, well developed, in no acute distress. HEENT: normal.  Laceration to the bridge of the nose and left temple. Neck: Supple, no JVD, carotid bruits, or masses. Cardiac: RRR, no murmurs, rubs, or gallops. No clubbing, cyanosis, edema.  Radials/DP/PT 2+ and equal bilaterally.  Respiratory:  Respirations regular and unlabored, clear to auscultation bilaterally. GI: Soft, nontender, nondistended, BS + x 4. MS: no deformity or atrophy. Skin: warm and dry, no rash. Neuro:  Strength and sensation are intact. Psych: Flat affect.  Accessory Clinical Findings    ECG personally reviewed by me today-sinus bradycardia ventricular rate 56 bpm, no evidence of AV block or prolonged QT.- No acute changes  Lab Results  Component Value Date   WBC 7.5 08/07/2019   HGB 12.9 (L) 08/07/2019   HCT 38.1 08/07/2019   MCV 93 08/07/2019   PLT 182 08/07/2019   Lab Results  Component Value Date   CREATININE 0.85 09/05/2019   BUN 16 09/05/2019   NA 141 09/05/2019   K 4.9 09/05/2019   CL 104 09/05/2019   CO2 25 09/05/2019   Lab Results  Component Value Date   ALT 20 10/28/2019   AST 23 10/28/2019   ALKPHOS 74 10/28/2019   BILITOT 0.8 10/28/2019   Lab Results  Component Value Date   CHOL 134 10/28/2019   HDL 53 10/28/2019   LDLCALC 60 10/28/2019   TRIG 121 10/28/2019   CHOLHDL 2.5 10/28/2019    Lab Results  Component Value Date   HGBA1C 5.4 12/04/2018    Review of Prior Studies: [facility] 08/14/2019  Conclusion    Prox RCA to Mid RCA lesion is 20% stenosed. 3rd Mrg lesion is 100% stenosed. Ost Cx to Mid Cx lesion is 40% stenosed. Mid LAD lesion is 20% stenosed. Ost LAD to Prox LAD lesion is 99% stenosed. Prox LAD to Mid LAD lesion is 60% stenosed. A drug-eluting stent was  successfully placed using a STENT RESOLUTE ONYX 3.0X18. Post intervention, there is a 0% residual stenosis. A drug-eluting stent was successfully placed using a STENT RESOLUTE ONYX 3.5X15. Post intervention, there is a 0% residual stenosis. The left ventricular systolic function is normal. LV end diastolic pressure is normal. The left ventricular ejection fraction is 55-65% by visual estimate. There is no mitral valve regurgitation.   1. Severe, heavily calcified ostial LAD stenosis. Moderate, calcified disease in the proximal/mid LAD. Patent mid stent with minimal restenosis.  2. The Circumflex has mild diffuse disease. The most distal obtuse marginal branch is chronically occluded, small in caliber and fills from left to left collaterals.  3. The RCA is a large dominant vessel with mild plaque.  4. Normal LV systolic function 5. Successful PTCA/Intracoronary lithotripsy (Shockwave balloon), placement 2 drug eluting stents proximal/mid LAD   Recommendations: DAPT with ASA and Plavix for at least six months but longer if tolerated.   Diagnostic Dominance: Right  Intervention      Assessment & Plan   1.  Coronary artery disease: Known history of 2 drug-eluting stents to the LAD in 2021.  The patient denies any discomfort in his chest but is having more fatigue with exercise.  The patient is basically very sedentary and his wife has encouraged him to walk and become more active.  As he is beginning to do so he has become more fatigued.  At this point I do not think we need to do any ischemic testing.  May need to consider discontinuation of beta-blocker therapy.  2.  Questionable syncopal episode: The patient states he did not lose consciousness however he did fall and injure his nose and left temple with laceration seen.  Will place a ZIO monitor for 1 week to evaluate for any significant bradycardia or pauses.  Also for arrhythmias with exercise.  Consider stopping beta-blocker therapy.   He is also on alpha-blocker which may be contributing to possible syncopal episode.  It is uncertain etiology for this episode at this time.  3.  Hypertension: Currently well-controlled.  No change in his medication regimen at this time.  4.  Gait disturbance with questionable tremors: This is reported by his wife who states that he takes small rapid steps which tends to wear him out and she has noticed him having tremors.  They have a follow-up appointment with neurologist on August 31, 2022 they are encouraged to keep that appointment.  5.  Hypercholesterolemia: Goal of LDL less than 70.  Remains on atorvastatin 80 mg daily.  Follow-up labs for fasting lipids and LFTs can be completed on next office visit if not completed by PCP.        Signed, Bettey Mare. Liborio Nixon, ANP, AACC   08/19/2022 9:50 AM      Office [phone] Fax [phone]  Notice: This dictation was prepared with Dragon dictation along with smaller phrase technology. Any transcriptional errors that result from this process are unintentional and may not be corrected upon review.

## 2022-08-16 NOTE — Telephone Encounter (Signed)
Patient is returning call. Transferred to [place], [place].

## 2022-08-16 NOTE — Telephone Encounter (Signed)
Called patient regarding appointment on 08/19/22 with K. Lawerence Left message forpatient to call office.

## 2022-08-17 DIAGNOSIS — N138 Other obstructive and reflux uropathy: Secondary | ICD-10-CM | POA: Diagnosis not present

## 2022-08-17 DIAGNOSIS — I739 Peripheral vascular disease, unspecified: Secondary | ICD-10-CM | POA: Diagnosis not present

## 2022-08-17 DIAGNOSIS — F4322 Adjustment disorder with anxiety: Secondary | ICD-10-CM | POA: Diagnosis not present

## 2022-08-17 DIAGNOSIS — F0280 Dementia in other diseases classified elsewhere without behavioral disturbance: Secondary | ICD-10-CM | POA: Diagnosis not present

## 2022-08-17 DIAGNOSIS — N401 Enlarged prostate with lower urinary tract symptoms: Secondary | ICD-10-CM | POA: Diagnosis not present

## 2022-08-17 DIAGNOSIS — G2581 Restless legs syndrome: Secondary | ICD-10-CM | POA: Diagnosis not present

## 2022-08-17 DIAGNOSIS — E785 Hyperlipidemia, unspecified: Secondary | ICD-10-CM | POA: Diagnosis not present

## 2022-08-17 DIAGNOSIS — Z556 Problems related to health literacy: Secondary | ICD-10-CM | POA: Diagnosis not present

## 2022-08-17 DIAGNOSIS — I10 Essential (primary) hypertension: Secondary | ICD-10-CM | POA: Diagnosis not present

## 2022-08-17 DIAGNOSIS — J309 Allergic rhinitis, unspecified: Secondary | ICD-10-CM | POA: Diagnosis not present

## 2022-08-17 DIAGNOSIS — G309 Alzheimer's disease, unspecified: Secondary | ICD-10-CM | POA: Diagnosis not present

## 2022-08-17 DIAGNOSIS — Z7982 Long term (current) use of aspirin: Secondary | ICD-10-CM | POA: Diagnosis not present

## 2022-08-19 ENCOUNTER — Ambulatory Visit (INDEPENDENT_AMBULATORY_CARE_PROVIDER_SITE_OTHER): Payer: Medicare Other

## 2022-08-19 ENCOUNTER — Encounter: Payer: Self-pay | Admitting: Adult Health

## 2022-08-19 ENCOUNTER — Ambulatory Visit: Payer: Medicare Other | Attending: Adult Health | Admitting: Adult Health

## 2022-08-19 VITALS — BP 132/84 | HR 61 | Ht 72.0 in | Wt 189.4 lb

## 2022-08-19 DIAGNOSIS — R55 Syncope and collapse: Secondary | ICD-10-CM

## 2022-08-19 DIAGNOSIS — I10 Essential (primary) hypertension: Secondary | ICD-10-CM

## 2022-08-19 DIAGNOSIS — E7800 Pure hypercholesterolemia, unspecified: Secondary | ICD-10-CM

## 2022-08-19 DIAGNOSIS — R001 Bradycardia, unspecified: Secondary | ICD-10-CM

## 2022-08-19 DIAGNOSIS — I2510 Atherosclerotic heart disease of native coronary artery without angina pectoris: Secondary | ICD-10-CM

## 2022-08-19 DIAGNOSIS — H524 Presbyopia: Secondary | ICD-10-CM | POA: Diagnosis not present

## 2022-08-19 DIAGNOSIS — R269 Unspecified abnormalities of gait and mobility: Secondary | ICD-10-CM

## 2022-08-19 NOTE — Patient Instructions (Signed)
Medication Instructions:  No Changes *If you need a refill on your cardiac medications before your next appointment, please call your pharmacy*   Lab Work: None Ordered   Testing/Procedures: None Ordered   Follow-Up: At [facility], you and your health needs are our priority.  As part of our continuing mission to provide you with exceptional heart care, we have created designated Provider Care Teams.  These Care Teams include your primary Cardiologist (physician) and Advanced Practice Providers (APPs -  Physician Assistants and Nurse Practitioners) who all work together to provide you with the care you need, when you need it.  We recommend signing up for the patient portal called "MyChart".  Sign up information is provided on this After Visit Summary.  MyChart is used to connect with patients for Virtual Visits (Telemedicine).  Patients are able to view lab/test results, encounter notes, upcoming appointments, etc.  Non-urgent messages can be sent to your provider as well.   To learn more about what you can do with MyChart, go to [email or web address].    Your next appointment:   6 week(s)  Provider:   Olga Millers, MD  or Joni Reining, DNP, ANP       Other Instructions Jeremy Simon- Long Term Monitor Instructions  Your physician has requested you wear a ZIO patch monitor for 7 days.  This is a single patch monitor. [employer] supplies one patch monitor per enrollment. Additional stickers are not available. Please do not apply patch if you will be having a Nuclear Stress Test, Echocardiogram, Cardiac CT, MRI, or Chest Xray during the period you would be wearing the monitor. The patch cannot be worn during these tests. You cannot remove and re-apply the ZIO XT patch monitor.  Your ZIO patch monitor will be mailed 3 day USPS to your address on file. It may take 3-5 days to receive your monitor after you have been enrolled.  Once you have received your monitor, please review the  enclosed instructions. Your monitor has already been registered assigning a specific monitor serial # to you.  Billing and Patient Assistance Program Information  We have supplied [employer] with any of your insurance information on file for billing purposes. [employer] offers a sliding scale Patient Assistance Program for patients that do not have insurance, or whose insurance does not completely cover the cost of the ZIO monitor.  You must apply for the Patient Assistance Program to qualify for this discounted rate.  To apply, please call [employer] at [phone], select option 4, select option 2, ask to apply for Patient Assistance Program. Jeremy Simon will ask your household income, and how many people are in your household. They will quote your out-of-pocket cost based on that information.  [employer] will also be able to set up a 75-month, interest-free payment plan if needed.  Applying the monitor   Shave hair from upper left chest.  Hold abrader disc by orange tab. Rub abrader in 40 strokes over the upper left chest as indicated in your monitor instructions.  Clean area with 4 enclosed alcohol pads. Let dry.  Apply patch as indicated in monitor instructions. Patch will be placed under collarbone on left side of chest with arrow pointing upward.  Rub patch adhesive wings for 2 minutes. Remove white label marked "1". Remove the white label marked "2". Rub patch adhesive wings for 2 additional minutes.  While looking in a mirror, press and release button in center of patch. A small green light will flash 3-4 times. This will  be your only indicator that the monitor has been turned on.  Do not shower for the first 24 hours. You may shower after the first 24 hours.  Press the button if you feel a symptom. You will hear a small click. Record Date, Time and Symptom in the Patient Logbook.  When you are ready to remove the patch, follow instructions on the last 2 pages of Patient Logbook. Stick patch monitor  onto the last page of Patient Logbook.  Place Patient Logbook in the blue and white box. Use locking tab on box and tape box closed securely. The blue and white box has prepaid postage on it. Please place it in the mailbox as soon as possible. Your physician should have your test results approximately 7 days after the monitor has been mailed back to [facility].  Call [facility] Customer Care at [phone] if you have questions regarding your ZIO XT patch monitor. Call them immediately if you see an orange light blinking on your monitor.  If your monitor falls off in less than 4 days, contact our Monitor department at [phone].  If your monitor becomes loose or falls off after 4 days call [employer] at [phone] for suggestions on securing your monitor.

## 2022-08-19 NOTE — Progress Notes (Unsigned)
Enrolled patient for a 7 day Zio XT monitor to be mailed to patients home   Crenshaw to read 

## 2022-08-22 DIAGNOSIS — I739 Peripheral vascular disease, unspecified: Secondary | ICD-10-CM | POA: Diagnosis not present

## 2022-08-22 DIAGNOSIS — F0280 Dementia in other diseases classified elsewhere without behavioral disturbance: Secondary | ICD-10-CM | POA: Diagnosis not present

## 2022-08-22 DIAGNOSIS — G2581 Restless legs syndrome: Secondary | ICD-10-CM | POA: Diagnosis not present

## 2022-08-22 DIAGNOSIS — Z7982 Long term (current) use of aspirin: Secondary | ICD-10-CM | POA: Diagnosis not present

## 2022-08-22 DIAGNOSIS — Z556 Problems related to health literacy: Secondary | ICD-10-CM | POA: Diagnosis not present

## 2022-08-22 DIAGNOSIS — G309 Alzheimer's disease, unspecified: Secondary | ICD-10-CM | POA: Diagnosis not present

## 2022-08-22 DIAGNOSIS — E785 Hyperlipidemia, unspecified: Secondary | ICD-10-CM | POA: Diagnosis not present

## 2022-08-22 DIAGNOSIS — I10 Essential (primary) hypertension: Secondary | ICD-10-CM | POA: Diagnosis not present

## 2022-08-22 DIAGNOSIS — F4322 Adjustment disorder with anxiety: Secondary | ICD-10-CM | POA: Diagnosis not present

## 2022-08-22 DIAGNOSIS — N401 Enlarged prostate with lower urinary tract symptoms: Secondary | ICD-10-CM | POA: Diagnosis not present

## 2022-08-22 DIAGNOSIS — N138 Other obstructive and reflux uropathy: Secondary | ICD-10-CM | POA: Diagnosis not present

## 2022-08-22 DIAGNOSIS — J309 Allergic rhinitis, unspecified: Secondary | ICD-10-CM | POA: Diagnosis not present

## 2022-08-23 DIAGNOSIS — J309 Allergic rhinitis, unspecified: Secondary | ICD-10-CM | POA: Diagnosis not present

## 2022-08-23 DIAGNOSIS — G2581 Restless legs syndrome: Secondary | ICD-10-CM | POA: Diagnosis not present

## 2022-08-23 DIAGNOSIS — I10 Essential (primary) hypertension: Secondary | ICD-10-CM | POA: Diagnosis not present

## 2022-08-23 DIAGNOSIS — G309 Alzheimer's disease, unspecified: Secondary | ICD-10-CM | POA: Diagnosis not present

## 2022-08-23 DIAGNOSIS — F4322 Adjustment disorder with anxiety: Secondary | ICD-10-CM | POA: Diagnosis not present

## 2022-08-23 DIAGNOSIS — F0280 Dementia in other diseases classified elsewhere without behavioral disturbance: Secondary | ICD-10-CM | POA: Diagnosis not present

## 2022-08-23 DIAGNOSIS — I739 Peripheral vascular disease, unspecified: Secondary | ICD-10-CM | POA: Diagnosis not present

## 2022-08-23 DIAGNOSIS — E785 Hyperlipidemia, unspecified: Secondary | ICD-10-CM | POA: Diagnosis not present

## 2022-08-23 DIAGNOSIS — N401 Enlarged prostate with lower urinary tract symptoms: Secondary | ICD-10-CM | POA: Diagnosis not present

## 2022-08-23 DIAGNOSIS — Z7982 Long term (current) use of aspirin: Secondary | ICD-10-CM | POA: Diagnosis not present

## 2022-08-23 DIAGNOSIS — Z556 Problems related to health literacy: Secondary | ICD-10-CM | POA: Diagnosis not present

## 2022-08-23 DIAGNOSIS — N138 Other obstructive and reflux uropathy: Secondary | ICD-10-CM | POA: Diagnosis not present

## 2022-08-24 DIAGNOSIS — N401 Enlarged prostate with lower urinary tract symptoms: Secondary | ICD-10-CM | POA: Diagnosis not present

## 2022-08-24 DIAGNOSIS — N138 Other obstructive and reflux uropathy: Secondary | ICD-10-CM | POA: Diagnosis not present

## 2022-08-24 DIAGNOSIS — I739 Peripheral vascular disease, unspecified: Secondary | ICD-10-CM | POA: Diagnosis not present

## 2022-08-24 DIAGNOSIS — F0280 Dementia in other diseases classified elsewhere without behavioral disturbance: Secondary | ICD-10-CM | POA: Diagnosis not present

## 2022-08-24 DIAGNOSIS — F4322 Adjustment disorder with anxiety: Secondary | ICD-10-CM | POA: Diagnosis not present

## 2022-08-24 DIAGNOSIS — G2581 Restless legs syndrome: Secondary | ICD-10-CM | POA: Diagnosis not present

## 2022-08-24 DIAGNOSIS — Z7982 Long term (current) use of aspirin: Secondary | ICD-10-CM | POA: Diagnosis not present

## 2022-08-24 DIAGNOSIS — I10 Essential (primary) hypertension: Secondary | ICD-10-CM | POA: Diagnosis not present

## 2022-08-24 DIAGNOSIS — J309 Allergic rhinitis, unspecified: Secondary | ICD-10-CM | POA: Diagnosis not present

## 2022-08-24 DIAGNOSIS — G309 Alzheimer's disease, unspecified: Secondary | ICD-10-CM | POA: Diagnosis not present

## 2022-08-24 DIAGNOSIS — E785 Hyperlipidemia, unspecified: Secondary | ICD-10-CM | POA: Diagnosis not present

## 2022-08-24 DIAGNOSIS — Z556 Problems related to health literacy: Secondary | ICD-10-CM | POA: Diagnosis not present

## 2022-08-25 DIAGNOSIS — E7800 Pure hypercholesterolemia, unspecified: Secondary | ICD-10-CM

## 2022-08-25 DIAGNOSIS — R001 Bradycardia, unspecified: Secondary | ICD-10-CM

## 2022-08-25 DIAGNOSIS — R55 Syncope and collapse: Secondary | ICD-10-CM

## 2022-08-29 DIAGNOSIS — I739 Peripheral vascular disease, unspecified: Secondary | ICD-10-CM | POA: Diagnosis not present

## 2022-08-29 DIAGNOSIS — F0280 Dementia in other diseases classified elsewhere without behavioral disturbance: Secondary | ICD-10-CM | POA: Diagnosis not present

## 2022-08-29 DIAGNOSIS — Z7982 Long term (current) use of aspirin: Secondary | ICD-10-CM | POA: Diagnosis not present

## 2022-08-29 DIAGNOSIS — G2581 Restless legs syndrome: Secondary | ICD-10-CM | POA: Diagnosis not present

## 2022-08-29 DIAGNOSIS — I10 Essential (primary) hypertension: Secondary | ICD-10-CM | POA: Diagnosis not present

## 2022-08-29 DIAGNOSIS — Z556 Problems related to health literacy: Secondary | ICD-10-CM | POA: Diagnosis not present

## 2022-08-29 DIAGNOSIS — N138 Other obstructive and reflux uropathy: Secondary | ICD-10-CM | POA: Diagnosis not present

## 2022-08-29 DIAGNOSIS — F4322 Adjustment disorder with anxiety: Secondary | ICD-10-CM | POA: Diagnosis not present

## 2022-08-29 DIAGNOSIS — E785 Hyperlipidemia, unspecified: Secondary | ICD-10-CM | POA: Diagnosis not present

## 2022-08-29 DIAGNOSIS — G309 Alzheimer's disease, unspecified: Secondary | ICD-10-CM | POA: Diagnosis not present

## 2022-08-29 DIAGNOSIS — J309 Allergic rhinitis, unspecified: Secondary | ICD-10-CM | POA: Diagnosis not present

## 2022-08-29 DIAGNOSIS — N401 Enlarged prostate with lower urinary tract symptoms: Secondary | ICD-10-CM | POA: Diagnosis not present

## 2022-08-30 DIAGNOSIS — Z7982 Long term (current) use of aspirin: Secondary | ICD-10-CM | POA: Diagnosis not present

## 2022-08-30 DIAGNOSIS — N401 Enlarged prostate with lower urinary tract symptoms: Secondary | ICD-10-CM | POA: Diagnosis not present

## 2022-08-30 DIAGNOSIS — E785 Hyperlipidemia, unspecified: Secondary | ICD-10-CM | POA: Diagnosis not present

## 2022-08-30 DIAGNOSIS — N138 Other obstructive and reflux uropathy: Secondary | ICD-10-CM | POA: Diagnosis not present

## 2022-08-30 DIAGNOSIS — F4322 Adjustment disorder with anxiety: Secondary | ICD-10-CM | POA: Diagnosis not present

## 2022-08-30 DIAGNOSIS — G309 Alzheimer's disease, unspecified: Secondary | ICD-10-CM | POA: Diagnosis not present

## 2022-08-30 DIAGNOSIS — I739 Peripheral vascular disease, unspecified: Secondary | ICD-10-CM | POA: Diagnosis not present

## 2022-08-30 DIAGNOSIS — Z556 Problems related to health literacy: Secondary | ICD-10-CM | POA: Diagnosis not present

## 2022-08-30 DIAGNOSIS — J309 Allergic rhinitis, unspecified: Secondary | ICD-10-CM | POA: Diagnosis not present

## 2022-08-30 DIAGNOSIS — G2581 Restless legs syndrome: Secondary | ICD-10-CM | POA: Diagnosis not present

## 2022-08-30 DIAGNOSIS — I10 Essential (primary) hypertension: Secondary | ICD-10-CM | POA: Diagnosis not present

## 2022-08-30 DIAGNOSIS — F0280 Dementia in other diseases classified elsewhere without behavioral disturbance: Secondary | ICD-10-CM | POA: Diagnosis not present

## 2022-08-30 NOTE — Progress Notes (Unsigned)
ASSESSMENT AND PLAN 77 y.o. year old male   1.  Dementia, worsening memory loss -MoCA 16/30 (21/07 Mar 2022), most concerned about memory decline, will check ATN to help is supporting diagnosis of Alzheimer's disease -Poor judgment triggering recent fall, with nasal fracture -I will get the records from [facility] to review imaging -Continue Namenda 10 mg twice daily -Previously had side effects with Aricept -MRI of the brain in September 2021 showed generalized atrophy, mild small vessel disease, microhemorrhage, most consistent with central nervous system degenerative disorder, suspicious for early stage of Alzheimer's disease  2.  Restless leg syndrome 3.  Gait abnormality 4.  Bilateral lower extremity paresthesia 5.  Cervical spine stenosis -Continue physical and Occupational Therapy via home health -Remains on Cymbalta 60 mg daily, he stopped Requip, Lyrica without adverse effect -Is no longer seen spine specialist, prior injections, cervical ablation offered no benefit -Follow-up in 6 months or sooner if needed with Dr.Yan  Update 09/01/22 SS: I reviewed notes from [facility], 08/10/22 he presented after a near syncopal event with facial abrasions and nasal fracture.  CT maxillofacial showed proximal nasal bone fracture.  Troponin initially 0.06, 3 hours increased 0.09.  He was admitted for observation of troponins.  WBC 11.5, glucose 127, creatinine 0.90. I recommended MRI brain, given gait concerns, memory change, she wants to monitor for now. I called the patient's wife we discussed above.    DIAGNOSTIC DATA (LABS, IMAGING, TESTING)  MRI of the brain on January 04 2020: Generalized atrophy, microhemorrhage, mild supratentorial and small vessel disease, no acute abnormality.   MRI scan cervical spine without contrast on August 05, 2020 showing prominent spondylitic changes from C3-C7 most prominent at C5-6 where there is moderate canal stenosis and bilateral foraminal  narrowing with displacement of cord.  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  MRI thoracic spine in November 2022 showed no significant spinal stenosis, there was multilevel degenerative changes  HISTORY OF PRESENT ILLNESS: Jeremy Simon is a 77 years old male, seen in request by his primary care physician Dr. Leonia Reader, Barbara Cower for evaluation of neuropathy, initial evaluation was on December 04, 2018.   Past medical history of hypertension, hyperlipidemia, coronary artery disease, status post stent   Since 2019, he noticed frequent bilateral calf muscle deep achy pain, especially at nighttime, difficulty falling to sleep, massage, pacing around did not help, he also complains of bilateral feet, toes tingling, he denies burning pain, denies gait abnormality, mild low back pain, denies significant radiating pain, denies bowel bladder incontinence, he does complains of frequent shoulder achy pain, denies upper extremity paresthesia or weakness, he also has occasionally bilateral posterior thigh muscle cramping  Laboratory evaluation in February 2020 showed normal CBC, CMP with exception of mild elevated glucose 116, normal TSH, LDL was 53   EMG nerve conduction study [facility] on July 20, 2018 showed bilateral axonal peripheral polyneuropathy   He started on Requip 1 mg 2 tablets every night since July 2020, not sure about the benefit, he can go to sleep without much difficulty, taking trazodone 50 mg every night too, but he often woke up at 4 AM, is hard for him to find a comfortable position, previously has tried gabapentin 300 mg every night without helping his symptoms.   Laboratory evaluation in July 2020 showed normal negative protein electrophoresis, vitamin D, ferritin, C-reactive protein, B12, ESR, RPR, ANA, A1c was 5.4   He is also concerned about his mild  memory loss, his mother suffered dementia in his 48s, occasionally slip of memory,  highly functional has no difficulty handling his daily activity, today's Mini-Mental Status Examination is 30 out of 30.   MRI of lumbar in August 2020: Multilevel degenerative changes, hemangioma within L2, L4, no significant canal or foraminal narrowing   UPDATE July 28 2020: With change of his medication, Requip 1 mg every night, he can sleep better, he complains of slow worsening bilateral lower extremity weakness, increased bilateral feet numbness tingling, especially during the early morning, it is painful to take the first few steps, he has to be careful not to 4  He had few years history of slow worsening urinary incontinence, has frequent nocturnal accident, is treated with Flomax 0.4 mg daily, Myrbetriq 25 mg every night, he has to set up clock every 3 hours to remind him using bathroom in the middle of the sleep  He had a coronary artery disease, status post stent April 2021, overlap with aspirin 81 mg plus Plavix 75 mg daily for 1 year, we will stop Plavix in May 2022  We personally reviewed MRI of brain without contrast, generalized atrophy, scattered microhemorrhage, mild supratentorial and small vessel disease.  Laboratory evaluation on July 03, 2020, normal hemoglobin 14.4, CMP, creatinine of 0.9, A1c of 5.5, LDL of 63   Update August 05, 2020 EMG nerve conduction study today is normal, there is no evidence of large fiber peripheral neuropathy, no evidence of right cervical radiculopathy  Continue complains of worsening gait abnormality, brisk reflex especially at bilateral patella, intermittent bilateral hands paresthesia, also has worsening urinary incontinence, MRI of cervical spine to rule out cervical spondylitic myelopathy, pending for August 13, 2020  He sleeps well with Requip 1 mg 2 tablets every night, will try to decrease to 1 mg every night, continue has worsening urinary urgency, frequency, that is helped by Myrbetriq  UPDATE August 19 2020: He is accompanied by  his wife at today's clinical visit, continued complaints of lower extremity paresthesia, mildly unsteady gait urinary urgency, but denies incontinence  Personally reviewed MRI of cervical spine on August 13, 2020, prominent spondylitic changes from C3-7, most prominent at C5-6, there is moderate canal stenosis bilateral foraminal narrowing, disc displacement of the cord, C4-5 also's showed significant degenerative changes with moderate left-sided foraminal narrowing  UPDATE Feb 27 2021: His restless leg symptoms has much improved,  denies difficulty sleeping, but he has to set up alarm clock to wake him up every 2 hours to use bathroom, few times each month, he would have urinary accident during sleep, sometimes he took his Requip 1 mg 2 tablets 5 AM when he wakes up early morning using bathroom, which seems to help his bilateral feet paresthesia,  He is not very physically active, have low back pain, continue has mild gait abnormality, chronic constipation,  Slow worsening memory loss, MoCA examination 22/30 today,  UPDATE Feb 28 2022: He is accompanied by his wife at today's clinical visit, overall stable, MoCA examination 21/30, tolerating Namenda at 10 mg twice a day  He was seen by scoliosis clinic February 16, 2022 had a radiofrequency ablation, still has neck stiffness, taking Norco 50 mg twice a day as needed  He sleeps well compared to previous, taking Requip 1 mg every night, falling to sleep quickly, but set her alarm every 2 hours for urination, worried about the nocturnal enuresis  He denies lower snoring, wife noticed that he has body tremor excessive movement occasionally,  Update August 31, 2022 SS: [facility] 16/30. Here with his wife Jeremy Simon, fall few weeks ago, made bad judgement to walk 1+ mile to his church, had been awhile since he did this. At 1 mile, he wore out. He fell into gravel grassy area, face first. Had some cuts, bruising to his face, no stitches required. He went to [place].  He had fractured nose. He was supposed to drive his care to church but couldn't find his keys. Is now using cane. I don't have the records from [place] to review, wife reports troponin initially elevated x 2. Wearing cardiac monitor right now. Several cardiac tests were performed. Doing PT/OT. He feels he has recovered now. Remains on Namenda 10 mg twice daily. Wife concerned prior to fall, his gait had changed, tendency for short strides, he gets out of breath. Building back up his endurance for walking hills. He does minimal driving to church, hair. With gait, when into walking, will speed up. No longer seeing spine center, had 2 injection, ablation last year, with minimal improvement. Remains on Cymbalta, had stopped Requip and Lyrica. Has not needed Tylenol in 3 weeks. Wife is managing medications now.   PHYSICAL EXAM  Vitals:   08/19/20 1447  BP: 138/65  Pulse: 61  Weight: 180 lb 8 oz (81.9 kg)  Height: 6' (1.829 m)   Body mass index is 24.48 kg/m.  Generalized: Well developed, in no acute distress, well dressed     08/31/2022    9:53 AM 02/28/2022   10:04 AM 08/26/2021    9:07 AM 02/25/2021    7:49 AM  Montreal Cognitive Assessment   Visuospatial/ Executive (0/5) 4 4 4 3   Naming (0/3) 2 3 3 3   Attention: Read list of digits (0/2) 1 2 2 2   Attention: Read list of letters (0/1) 0 1 1 1   Attention: Serial 7 subtraction starting at 100 (0/3) 1 3 3 3   Language: Repeat phrase (0/2) 0 1 1 2   Language : Fluency (0/1) 1 0 0 0  Abstraction (0/2) 1 1 1 2   Delayed Recall (0/5) 0 0 0 0  Orientation (0/6) 6 6 6 6   Total 16 21 21 22   Adjusted Score (based on education)  21 21 22     Physical Exam  General: The patient is alert and cooperative at the time of the examination.  Skin: No significant peripheral edema is noted.  Neurologic Exam  Mental status: The patient is alert and oriented x 3 at the time of the examination. The patient has apparent normal recent and remote memory,  with an apparently normal attention span and concentration ability. No obvious inaccuracies with history.  Wife provides most history. Takes extended time to provide history, speech is somewhat slow.  Cranial nerves: Facial symmetry is present. Speech is normal, no aphasia or dysarthria is noted. Extraocular movements are full. Visual fields are full.  Motor: The patient has good strength in all 4 extremities.  Sensory examination: Soft touch sensation is symmetric on the face, arms, and legs.  Coordination: The patient has good finger-nose-finger and heel-to-shin bilaterally.  Gait and station: Can stand from seated position without pushoff, gait is slightly wide-based, somewhat slow, good strides.  Has a quad cane he carries but does not touch the ground.  Reflexes: Deep tendon reflexes are symmetric and normal.  REVIEW OF SYSTEMS: Out of a complete 14 system review of symptoms, the patient complains only of the following symptoms, and all other reviewed systems are negative.  See HPI  ALLERGIES: Allergies  Allergen Reactions   Altace [Ramipril]     Hives, lip swelling   Norvasc [Amlodipine] Swelling    Left foot swelling    HOME MEDICATIONS: Outpatient Medications Prior to Visit  Medication Sig Dispense Refill   acetaminophen (TYLENOL) 500 MG tablet Take 500 mg by mouth every 6 (six) hours as needed for mild pain or moderate pain.     aspirin EC 81 MG tablet Take 81 mg by mouth daily. Swallow whole.     atorvastatin (LIPITOR) 80 MG tablet Take 1 tablet (80 mg total) by mouth daily. 90 tablet 3   Azelastine HCl 137 MCG/SPRAY SOLN Place into both nostrils. AS NEEDED     calcium carbonate (TUMS EX) 750 MG chewable tablet Chew 1 tablet by mouth daily.     Cholecalciferol 1.25 MG (50000 UT) capsule Take 50,000 Units by mouth daily.     DULoxetine (CYMBALTA) 60 MG capsule Take 1 capsule (60 mg total) by mouth daily. 90 capsule 3   fluticasone (FLONASE) 50 MCG/ACT nasal spray Place 2  sprays into both nostrils daily.     memantine (NAMENDA) 10 MG tablet TAKE 1 TABLET TWICE A DAY 180 tablet 3   METAMUCIL FIBER PO Take by mouth.     metoprolol succinate (TOPROL-XL) 25 MG 24 hr tablet TAKE 1 TABLET BY MOUTH EVERY DAY 90 tablet 3   MYRBETRIQ 50 MG TB24 tablet Take 50 mg by mouth daily.     nitroGLYCERIN (NITROSTAT) 0.4 MG SL tablet Place 1 tablet (0.4 mg total) under the tongue every 5 (five) minutes as needed. 25 tablet 2   Polyethylene Glycol 3350 (MIRALAX PO) Take by mouth.     tamsulosin (FLOMAX) 0.4 MG CAPS capsule Take 0.4 mg by mouth daily.     rOPINIRole (REQUIP) 1 MG tablet Take 1 tablet (1 mg total) by mouth 2 (two) times daily. (Patient not taking: Reported on 08/19/2022) 180 tablet 3   No facility-administered medications prior to visit.    PAST MEDICAL HISTORY: Past Medical History:  Diagnosis Date   Anxiety    Arthritis    CAD    Erectile dysfunction    HIATAL HERNIA    HYPERLIPIDEMIA    HYPERTENSION    Numbness in feet     PAST SURGICAL HISTORY: Past Surgical History:  Procedure Laterality Date   CARDIAC CATHETERIZATION     COLONOSCOPY  12/31/2014   Minimal internal hemorrhoids. Otherwise normal colonoscopy   CORONARY STENT INTERVENTION N/A 08/14/2019   Procedure: CORONARY STENT INTERVENTION;  Surgeon: Kathleene Hazel, MD;  Location: [facility];  Service: Cardiovascular;  Laterality: N/A;   LEFT HEART CATH AND CORONARY ANGIOGRAPHY N/A 08/14/2019   Procedure: LEFT HEART CATH AND CORONARY ANGIOGRAPHY;  Surgeon: Kathleene Hazel, MD;  Location: [facility] CV [facility];  Service: Cardiovascular;  Laterality: N/A;   NOSE SURGERY     TRANSURETHRAL RESECTION OF PROSTATE  03/2017    FAMILY HISTORY: Family History  Problem Relation Age of Onset   Alzheimer's disease Mother        SOME TYPE OF VALUE DISORDER   Heart attack Father    Hypertension Father    Obesity Father    Hypertension Sister    Diabetes Sister    Obesity Sister     Other Daughter        NO HEALTH PROBLEMS   Other Son        NO HEALTH PROBLEMS   Colon cancer  Neg Hx    Rectal cancer Neg Hx    Stomach cancer Neg Hx    Esophageal cancer Neg Hx    Pancreatic cancer Neg Hx    Liver cancer Neg Hx     SOCIAL HISTORY: Social History   Socioeconomic History   Marital status: Married    Spouse name: ann   Number of children: 2   Years of education: Not on file   Highest education level: Not on file  Occupational History   Not on file  Tobacco Use   Smoking status: Never   Smokeless tobacco: Never  Vaping Use   Vaping Use: Never used  Substance and Sexual Activity   Alcohol use: No    Alcohol/week: 0.0 standard drinks of alcohol   Drug use: No   Sexual activity: Yes  Other Topics Concern   Not on file  Social History Narrative   Lives   Caffeine use:    Social Determinants of Health   Financial Resource Strain: Not on file  Food Insecurity: Not on file  Transportation Needs: Not on file  Physical Activity: Not on file  Stress: Not on file  Social Connections: Not on file  Intimate Partner Violence: Not on file   Margie Ege, Edrick Oh, DNP  [facility] [address], [address] [place], [place] [address] [phone]

## 2022-08-31 ENCOUNTER — Encounter: Payer: Self-pay | Admitting: Neurology

## 2022-08-31 ENCOUNTER — Ambulatory Visit: Payer: Medicare Other | Admitting: Neurology

## 2022-08-31 ENCOUNTER — Telehealth: Payer: Self-pay | Admitting: Neurology

## 2022-08-31 VITALS — BP 144/73 | HR 57 | Ht 72.0 in | Wt 187.0 lb

## 2022-08-31 DIAGNOSIS — R269 Unspecified abnormalities of gait and mobility: Secondary | ICD-10-CM | POA: Diagnosis not present

## 2022-08-31 DIAGNOSIS — G309 Alzheimer's disease, unspecified: Secondary | ICD-10-CM | POA: Diagnosis not present

## 2022-08-31 DIAGNOSIS — G3184 Mild cognitive impairment, so stated: Secondary | ICD-10-CM

## 2022-08-31 DIAGNOSIS — R202 Paresthesia of skin: Secondary | ICD-10-CM

## 2022-08-31 DIAGNOSIS — N401 Enlarged prostate with lower urinary tract symptoms: Secondary | ICD-10-CM | POA: Diagnosis not present

## 2022-08-31 DIAGNOSIS — F0280 Dementia in other diseases classified elsewhere without behavioral disturbance: Secondary | ICD-10-CM | POA: Diagnosis not present

## 2022-08-31 DIAGNOSIS — F4322 Adjustment disorder with anxiety: Secondary | ICD-10-CM | POA: Diagnosis not present

## 2022-08-31 DIAGNOSIS — M47812 Spondylosis without myelopathy or radiculopathy, cervical region: Secondary | ICD-10-CM

## 2022-08-31 DIAGNOSIS — E785 Hyperlipidemia, unspecified: Secondary | ICD-10-CM | POA: Diagnosis not present

## 2022-08-31 DIAGNOSIS — I739 Peripheral vascular disease, unspecified: Secondary | ICD-10-CM | POA: Diagnosis not present

## 2022-08-31 DIAGNOSIS — G2581 Restless legs syndrome: Secondary | ICD-10-CM | POA: Diagnosis not present

## 2022-08-31 DIAGNOSIS — N138 Other obstructive and reflux uropathy: Secondary | ICD-10-CM | POA: Diagnosis not present

## 2022-08-31 DIAGNOSIS — J309 Allergic rhinitis, unspecified: Secondary | ICD-10-CM | POA: Diagnosis not present

## 2022-08-31 DIAGNOSIS — Z7982 Long term (current) use of aspirin: Secondary | ICD-10-CM | POA: Diagnosis not present

## 2022-08-31 DIAGNOSIS — I10 Essential (primary) hypertension: Secondary | ICD-10-CM | POA: Diagnosis not present

## 2022-08-31 DIAGNOSIS — Z556 Problems related to health literacy: Secondary | ICD-10-CM | POA: Diagnosis not present

## 2022-08-31 MED ORDER — MEMANTINE HCL 10 MG PO TABS: 10.0000 mg | ORAL_TABLET | Freq: Two times a day (BID) | ORAL | 3 refills | Status: DC

## 2022-08-31 NOTE — Telephone Encounter (Signed)
Stanton Kidney, can you please get recent ER visit from [place] earlier this month for me to review? Thanks

## 2022-08-31 NOTE — Patient Instructions (Addendum)
Check lab today  Continue physical therapy  Recommend against driving  See you back in 6 months

## 2022-09-03 LAB — ATN PROFILE
A -- Beta-amyloid 42/40 Ratio: 0.1 — ABNORMAL LOW (ref >0.102)
Beta-amyloid 40: 172.54 pg/mL
Beta-amyloid 42: 17.29 pg/mL
N -- NfL, Plasma: 20.1 pg/mL — ABNORMAL HIGH (ref 0.00–7.64)
T -- p-tau181: 1.01 pg/mL — ABNORMAL HIGH (ref 0.00–0.97)

## 2022-09-05 DIAGNOSIS — F0280 Dementia in other diseases classified elsewhere without behavioral disturbance: Secondary | ICD-10-CM | POA: Diagnosis not present

## 2022-09-05 DIAGNOSIS — F4322 Adjustment disorder with anxiety: Secondary | ICD-10-CM | POA: Diagnosis not present

## 2022-09-05 DIAGNOSIS — I739 Peripheral vascular disease, unspecified: Secondary | ICD-10-CM | POA: Diagnosis not present

## 2022-09-05 DIAGNOSIS — N401 Enlarged prostate with lower urinary tract symptoms: Secondary | ICD-10-CM | POA: Diagnosis not present

## 2022-09-05 DIAGNOSIS — Z7982 Long term (current) use of aspirin: Secondary | ICD-10-CM | POA: Diagnosis not present

## 2022-09-05 DIAGNOSIS — N138 Other obstructive and reflux uropathy: Secondary | ICD-10-CM | POA: Diagnosis not present

## 2022-09-05 DIAGNOSIS — E785 Hyperlipidemia, unspecified: Secondary | ICD-10-CM | POA: Diagnosis not present

## 2022-09-05 DIAGNOSIS — I10 Essential (primary) hypertension: Secondary | ICD-10-CM | POA: Diagnosis not present

## 2022-09-05 DIAGNOSIS — G2581 Restless legs syndrome: Secondary | ICD-10-CM | POA: Diagnosis not present

## 2022-09-05 DIAGNOSIS — G309 Alzheimer's disease, unspecified: Secondary | ICD-10-CM | POA: Diagnosis not present

## 2022-09-05 DIAGNOSIS — J309 Allergic rhinitis, unspecified: Secondary | ICD-10-CM | POA: Diagnosis not present

## 2022-09-05 DIAGNOSIS — Z556 Problems related to health literacy: Secondary | ICD-10-CM | POA: Diagnosis not present

## 2022-09-06 ENCOUNTER — Telehealth: Payer: Self-pay | Admitting: Neurology

## 2022-09-06 DIAGNOSIS — I10 Essential (primary) hypertension: Secondary | ICD-10-CM | POA: Diagnosis not present

## 2022-09-06 DIAGNOSIS — G309 Alzheimer's disease, unspecified: Secondary | ICD-10-CM | POA: Diagnosis not present

## 2022-09-06 DIAGNOSIS — N138 Other obstructive and reflux uropathy: Secondary | ICD-10-CM | POA: Diagnosis not present

## 2022-09-06 DIAGNOSIS — J309 Allergic rhinitis, unspecified: Secondary | ICD-10-CM | POA: Diagnosis not present

## 2022-09-06 DIAGNOSIS — Z556 Problems related to health literacy: Secondary | ICD-10-CM | POA: Diagnosis not present

## 2022-09-06 DIAGNOSIS — F0280 Dementia in other diseases classified elsewhere without behavioral disturbance: Secondary | ICD-10-CM | POA: Diagnosis not present

## 2022-09-06 DIAGNOSIS — Z7982 Long term (current) use of aspirin: Secondary | ICD-10-CM | POA: Diagnosis not present

## 2022-09-06 DIAGNOSIS — G2581 Restless legs syndrome: Secondary | ICD-10-CM | POA: Diagnosis not present

## 2022-09-06 DIAGNOSIS — N401 Enlarged prostate with lower urinary tract symptoms: Secondary | ICD-10-CM | POA: Diagnosis not present

## 2022-09-06 DIAGNOSIS — F4322 Adjustment disorder with anxiety: Secondary | ICD-10-CM | POA: Diagnosis not present

## 2022-09-06 DIAGNOSIS — E785 Hyperlipidemia, unspecified: Secondary | ICD-10-CM | POA: Diagnosis not present

## 2022-09-06 DIAGNOSIS — I739 Peripheral vascular disease, unspecified: Secondary | ICD-10-CM | POA: Diagnosis not present

## 2022-09-06 NOTE — Telephone Encounter (Signed)
I called the patient's wife, ATN profile was positive for Alzheimer's related pathology, Continue with current treatment plan. He continues with therapy, feels he is getting stronger. We talked again about MRI brain, she wants to hold off for now.

## 2022-09-07 DIAGNOSIS — G309 Alzheimer's disease, unspecified: Secondary | ICD-10-CM | POA: Diagnosis not present

## 2022-09-07 DIAGNOSIS — S022XXA Fracture of nasal bones, initial encounter for closed fracture: Secondary | ICD-10-CM | POA: Diagnosis not present

## 2022-09-07 DIAGNOSIS — R2681 Unsteadiness on feet: Secondary | ICD-10-CM | POA: Diagnosis not present

## 2022-09-07 DIAGNOSIS — F0280 Dementia in other diseases classified elsewhere without behavioral disturbance: Secondary | ICD-10-CM | POA: Diagnosis not present

## 2022-09-08 DIAGNOSIS — F0280 Dementia in other diseases classified elsewhere without behavioral disturbance: Secondary | ICD-10-CM | POA: Diagnosis not present

## 2022-09-08 DIAGNOSIS — J309 Allergic rhinitis, unspecified: Secondary | ICD-10-CM | POA: Diagnosis not present

## 2022-09-08 DIAGNOSIS — Z7982 Long term (current) use of aspirin: Secondary | ICD-10-CM | POA: Diagnosis not present

## 2022-09-08 DIAGNOSIS — N401 Enlarged prostate with lower urinary tract symptoms: Secondary | ICD-10-CM | POA: Diagnosis not present

## 2022-09-08 DIAGNOSIS — I10 Essential (primary) hypertension: Secondary | ICD-10-CM | POA: Diagnosis not present

## 2022-09-08 DIAGNOSIS — N138 Other obstructive and reflux uropathy: Secondary | ICD-10-CM | POA: Diagnosis not present

## 2022-09-08 DIAGNOSIS — F4322 Adjustment disorder with anxiety: Secondary | ICD-10-CM | POA: Diagnosis not present

## 2022-09-08 DIAGNOSIS — G2581 Restless legs syndrome: Secondary | ICD-10-CM | POA: Diagnosis not present

## 2022-09-08 DIAGNOSIS — R55 Syncope and collapse: Secondary | ICD-10-CM | POA: Diagnosis not present

## 2022-09-08 DIAGNOSIS — E7800 Pure hypercholesterolemia, unspecified: Secondary | ICD-10-CM | POA: Diagnosis not present

## 2022-09-08 DIAGNOSIS — Z556 Problems related to health literacy: Secondary | ICD-10-CM | POA: Diagnosis not present

## 2022-09-08 DIAGNOSIS — I739 Peripheral vascular disease, unspecified: Secondary | ICD-10-CM | POA: Diagnosis not present

## 2022-09-08 DIAGNOSIS — G309 Alzheimer's disease, unspecified: Secondary | ICD-10-CM | POA: Diagnosis not present

## 2022-09-08 DIAGNOSIS — E785 Hyperlipidemia, unspecified: Secondary | ICD-10-CM | POA: Diagnosis not present

## 2022-09-09 ENCOUNTER — Telehealth: Payer: Self-pay

## 2022-09-09 NOTE — Telephone Encounter (Addendum)
Called patient regarding results. Unable to leave message.----- Message from Jodelle Gross, NP sent at 09/09/2022 10:19 AM EDT ----- I have reviewed the results of the Zio monitor. No evidence of significantly slow HR that was found during hospitalization. Average HR was 62 bpm.  One episode of a fast HR that only lasted for 57 beats. This was not found to be concerning. Stayed in normal rhythm for most of the time during wearing monitor. No need to change any medications at this time.

## 2022-09-12 DIAGNOSIS — E785 Hyperlipidemia, unspecified: Secondary | ICD-10-CM | POA: Diagnosis not present

## 2022-09-12 DIAGNOSIS — N401 Enlarged prostate with lower urinary tract symptoms: Secondary | ICD-10-CM | POA: Diagnosis not present

## 2022-09-12 DIAGNOSIS — F4322 Adjustment disorder with anxiety: Secondary | ICD-10-CM | POA: Diagnosis not present

## 2022-09-12 DIAGNOSIS — Z556 Problems related to health literacy: Secondary | ICD-10-CM | POA: Diagnosis not present

## 2022-09-12 DIAGNOSIS — G2581 Restless legs syndrome: Secondary | ICD-10-CM | POA: Diagnosis not present

## 2022-09-12 DIAGNOSIS — N138 Other obstructive and reflux uropathy: Secondary | ICD-10-CM | POA: Diagnosis not present

## 2022-09-12 DIAGNOSIS — G309 Alzheimer's disease, unspecified: Secondary | ICD-10-CM | POA: Diagnosis not present

## 2022-09-12 DIAGNOSIS — I739 Peripheral vascular disease, unspecified: Secondary | ICD-10-CM | POA: Diagnosis not present

## 2022-09-12 DIAGNOSIS — J309 Allergic rhinitis, unspecified: Secondary | ICD-10-CM | POA: Diagnosis not present

## 2022-09-12 DIAGNOSIS — I10 Essential (primary) hypertension: Secondary | ICD-10-CM | POA: Diagnosis not present

## 2022-09-12 DIAGNOSIS — Z7982 Long term (current) use of aspirin: Secondary | ICD-10-CM | POA: Diagnosis not present

## 2022-09-12 DIAGNOSIS — F0280 Dementia in other diseases classified elsewhere without behavioral disturbance: Secondary | ICD-10-CM | POA: Diagnosis not present

## 2022-09-13 DIAGNOSIS — F0280 Dementia in other diseases classified elsewhere without behavioral disturbance: Secondary | ICD-10-CM | POA: Diagnosis not present

## 2022-09-13 DIAGNOSIS — G309 Alzheimer's disease, unspecified: Secondary | ICD-10-CM | POA: Diagnosis not present

## 2022-09-13 DIAGNOSIS — N138 Other obstructive and reflux uropathy: Secondary | ICD-10-CM | POA: Diagnosis not present

## 2022-09-13 DIAGNOSIS — J309 Allergic rhinitis, unspecified: Secondary | ICD-10-CM | POA: Diagnosis not present

## 2022-09-13 DIAGNOSIS — E785 Hyperlipidemia, unspecified: Secondary | ICD-10-CM | POA: Diagnosis not present

## 2022-09-13 DIAGNOSIS — I10 Essential (primary) hypertension: Secondary | ICD-10-CM | POA: Diagnosis not present

## 2022-09-13 DIAGNOSIS — F4322 Adjustment disorder with anxiety: Secondary | ICD-10-CM | POA: Diagnosis not present

## 2022-09-13 DIAGNOSIS — N401 Enlarged prostate with lower urinary tract symptoms: Secondary | ICD-10-CM | POA: Diagnosis not present

## 2022-09-13 DIAGNOSIS — G2581 Restless legs syndrome: Secondary | ICD-10-CM | POA: Diagnosis not present

## 2022-09-13 DIAGNOSIS — Z556 Problems related to health literacy: Secondary | ICD-10-CM | POA: Diagnosis not present

## 2022-09-13 DIAGNOSIS — Z7982 Long term (current) use of aspirin: Secondary | ICD-10-CM | POA: Diagnosis not present

## 2022-09-13 DIAGNOSIS — I739 Peripheral vascular disease, unspecified: Secondary | ICD-10-CM | POA: Diagnosis not present

## 2022-09-14 ENCOUNTER — Telehealth: Payer: Self-pay

## 2022-09-14 NOTE — Telephone Encounter (Addendum)
Called patient regarding results. Patient had understanding of results.----- Message from Jodelle Gross, NP sent at 09/09/2022 10:19 AM EDT ----- I have reviewed the results of the Zio monitor. No evidence of significantly slow HR that was found during hospitalization. Average HR was 62 bpm.  One episode of a fast HR that only lasted for 57 beats. This was not found to be concerning. Stayed in normal rhythm for most of the time during wearing monitor. No need to change any medications at this time.

## 2022-09-16 DIAGNOSIS — G309 Alzheimer's disease, unspecified: Secondary | ICD-10-CM | POA: Diagnosis not present

## 2022-09-20 DIAGNOSIS — N138 Other obstructive and reflux uropathy: Secondary | ICD-10-CM | POA: Diagnosis not present

## 2022-09-20 DIAGNOSIS — F4322 Adjustment disorder with anxiety: Secondary | ICD-10-CM | POA: Diagnosis not present

## 2022-09-20 DIAGNOSIS — F0280 Dementia in other diseases classified elsewhere without behavioral disturbance: Secondary | ICD-10-CM | POA: Diagnosis not present

## 2022-09-20 DIAGNOSIS — Z7982 Long term (current) use of aspirin: Secondary | ICD-10-CM | POA: Diagnosis not present

## 2022-09-20 DIAGNOSIS — G2581 Restless legs syndrome: Secondary | ICD-10-CM | POA: Diagnosis not present

## 2022-09-20 DIAGNOSIS — E785 Hyperlipidemia, unspecified: Secondary | ICD-10-CM | POA: Diagnosis not present

## 2022-09-20 DIAGNOSIS — I10 Essential (primary) hypertension: Secondary | ICD-10-CM | POA: Diagnosis not present

## 2022-09-20 DIAGNOSIS — Z556 Problems related to health literacy: Secondary | ICD-10-CM | POA: Diagnosis not present

## 2022-09-20 DIAGNOSIS — J309 Allergic rhinitis, unspecified: Secondary | ICD-10-CM | POA: Diagnosis not present

## 2022-09-20 DIAGNOSIS — N401 Enlarged prostate with lower urinary tract symptoms: Secondary | ICD-10-CM | POA: Diagnosis not present

## 2022-09-20 DIAGNOSIS — I739 Peripheral vascular disease, unspecified: Secondary | ICD-10-CM | POA: Diagnosis not present

## 2022-09-20 DIAGNOSIS — G309 Alzheimer's disease, unspecified: Secondary | ICD-10-CM | POA: Diagnosis not present

## 2022-09-21 DIAGNOSIS — G2581 Restless legs syndrome: Secondary | ICD-10-CM | POA: Diagnosis not present

## 2022-09-21 DIAGNOSIS — N401 Enlarged prostate with lower urinary tract symptoms: Secondary | ICD-10-CM | POA: Diagnosis not present

## 2022-09-21 DIAGNOSIS — Z556 Problems related to health literacy: Secondary | ICD-10-CM | POA: Diagnosis not present

## 2022-09-21 DIAGNOSIS — Z7982 Long term (current) use of aspirin: Secondary | ICD-10-CM | POA: Diagnosis not present

## 2022-09-21 DIAGNOSIS — J309 Allergic rhinitis, unspecified: Secondary | ICD-10-CM | POA: Diagnosis not present

## 2022-09-21 DIAGNOSIS — G309 Alzheimer's disease, unspecified: Secondary | ICD-10-CM | POA: Diagnosis not present

## 2022-09-21 DIAGNOSIS — F0280 Dementia in other diseases classified elsewhere without behavioral disturbance: Secondary | ICD-10-CM | POA: Diagnosis not present

## 2022-09-21 DIAGNOSIS — E785 Hyperlipidemia, unspecified: Secondary | ICD-10-CM | POA: Diagnosis not present

## 2022-09-21 DIAGNOSIS — N138 Other obstructive and reflux uropathy: Secondary | ICD-10-CM | POA: Diagnosis not present

## 2022-09-21 DIAGNOSIS — I739 Peripheral vascular disease, unspecified: Secondary | ICD-10-CM | POA: Diagnosis not present

## 2022-09-21 DIAGNOSIS — F4322 Adjustment disorder with anxiety: Secondary | ICD-10-CM | POA: Diagnosis not present

## 2022-09-21 DIAGNOSIS — I10 Essential (primary) hypertension: Secondary | ICD-10-CM | POA: Diagnosis not present

## 2022-09-27 NOTE — Progress Notes (Signed)
Cardiology Clinic Note   Patient Name: Jeremy Simon Date of Encounter: 09/30/2022  Primary Care Provider:  Buckner Malta, MD Primary Cardiologist:  Olga Millers, MD  Patient Profile    77 year old male with a history of CAD prior PCI-LAD in 2000.  Cath in 08/2019 showed 100% OM, 99% o-pLAD, 60% p-mLAD s/p DES x2. , hypertension, hyperlipidemia, ED, and chronic neck arthritis.    Unfortunately, [name] was admitted to [facility] on on 08/10/2022, after a syncopal episode. On evaluation in ED he denied any lightheadedness dizziness chest pain or shortness of breath or associated palpitations. He has a history of frequent falls over the last several months. He also complained of getting very fatigued and short of breath walking distances which she is normally able to do without difficulty. He sustained a wrist fracture of the left nasal bone. He was also to follow-up with neurologist on April 24 due to gait disturbance and tremors. He was to use a walker for ambulation    On last visit on 08/19/2022 I cardiac monitor was placed to rule out cardiac rhythm disturbances causing syncope.Monitor idid not reveal any abnormalities which would cause syncope.   Past Medical History    Past Medical History:  Diagnosis Date   Anxiety    Arthritis    CAD    Erectile dysfunction    HIATAL HERNIA    HYPERLIPIDEMIA    HYPERTENSION    Numbness in feet    Past Surgical History:  Procedure Laterality Date   CARDIAC CATHETERIZATION     COLONOSCOPY  12/31/2014   Minimal internal hemorrhoids. Otherwise normal colonoscopy   CORONARY STENT INTERVENTION N/A 08/14/2019   Procedure: CORONARY STENT INTERVENTION;  Surgeon: Kathleene Hazel, MD;  Location: [facility];  Service: Cardiovascular;  Laterality: N/A;   LEFT HEART CATH AND CORONARY ANGIOGRAPHY N/A 08/14/2019   Procedure: LEFT HEART CATH AND CORONARY ANGIOGRAPHY;  Surgeon: Kathleene Hazel, MD;  Location:  [facility];  Service: Cardiovascular;  Laterality: N/A;   NOSE SURGERY     TRANSURETHRAL RESECTION OF PROSTATE  03/2017    Allergies  Allergies  Allergen Reactions   Altace [Ramipril]     Hives, lip swelling   Norvasc [Amlodipine] Swelling    Left foot swelling    History of Present Illness    Mr. Jeremy Simon comes today with his wife without any new complaints.  His wife is concerned that he is sleeping more during the day than he has been in the past.  She states that he sleeps well at night but after being up for few hours in the morning he will take another nap and then a second 1 in the afternoon.  He is not very active nor does he wish to participate in activities.  He is being followed by neurology with progression of symptoms of dementia.  His wife takes him for a walk every day and he is able to complete it without having to stop for chest pain shortness of breath dizziness.  He has had no further syncopal episodes.  Home Medications    Current Outpatient Medications  Medication Sig Dispense Refill   acetaminophen (TYLENOL) 500 MG tablet Take 500 mg by mouth every 6 (six) hours as needed for mild pain or moderate pain.     aspirin EC 81 MG tablet Take 81 mg by mouth daily. Swallow whole.     atorvastatin (LIPITOR) 80 MG tablet Take 1 tablet (80 mg total)  by mouth daily. 90 tablet 3   Azelastine HCl 137 MCG/SPRAY SOLN Place into both nostrils. AS NEEDED     calcium carbonate (TUMS EX) 750 MG chewable tablet Chew 1 tablet by mouth daily.     Cholecalciferol 1.25 MG (50000 UT) capsule Take 50,000 Units by mouth daily.     DULoxetine (CYMBALTA) 60 MG capsule Take 1 capsule (60 mg total) by mouth daily. 90 capsule 3   fluticasone (FLONASE) 50 MCG/ACT nasal spray Place 2 sprays into both nostrils daily.     memantine (NAMENDA) 10 MG tablet Take 1 tablet (10 mg total) by mouth 2 (two) times daily. 180 tablet 3   METAMUCIL FIBER PO Take by mouth.     metoprolol succinate  (TOPROL-XL) 25 MG 24 hr tablet TAKE 1 TABLET BY MOUTH EVERY DAY 90 tablet 3   MYRBETRIQ 50 MG TB24 tablet Take 50 mg by mouth daily.     nitroGLYCERIN (NITROSTAT) 0.4 MG SL tablet Place 1 tablet (0.4 mg total) under the tongue every 5 (five) minutes as needed. 25 tablet 2   Polyethylene Glycol 3350 (MIRALAX PO) Take by mouth as needed.     tamsulosin (FLOMAX) 0.4 MG CAPS capsule Take 0.4 mg by mouth daily.     No current facility-administered medications for this visit.     Family History    Family History  Problem Relation Age of Onset   Alzheimer's disease Mother        SOME TYPE OF VALUE DISORDER   Heart attack Father    Hypertension Father    Obesity Father    Hypertension Sister    Diabetes Sister    Obesity Sister    Other Daughter        NO HEALTH PROBLEMS   Other Son        NO HEALTH PROBLEMS   Colon cancer Neg Hx    Rectal cancer Neg Hx    Stomach cancer Neg Hx    Esophageal cancer Neg Hx    Pancreatic cancer Neg Hx    Liver cancer Neg Hx    He indicated that his mother is deceased. He indicated that his father is deceased. He indicated that only one of his four sisters is alive. He indicated that his maternal grandmother is deceased. He indicated that his maternal grandfather is deceased. He indicated that his paternal grandmother is deceased. He indicated that his paternal grandfather is deceased. He indicated that only one of his two daughters is alive. He indicated that only one of his two sons is alive. He indicated that the status of his neg hx is unknown.  Social History    Social History   Socioeconomic History   Marital status: Married    Spouse name: ann   Number of children: 2   Years of education: Not on file   Highest education level: Not on file  Occupational History   Not on file  Tobacco Use   Smoking status: Never   Smokeless tobacco: Never  Vaping Use   Vaping Use: Never used  Substance and Sexual Activity   Alcohol use: No     Alcohol/week: 0.0 standard drinks of alcohol   Drug use: No   Sexual activity: Yes  Other Topics Concern   Not on file  Social History Narrative   Lives   Caffeine use:    Social Determinants of Health   Financial Resource Strain: Not on file  Food Insecurity: Not on file  Transportation Needs:  Not on file  Physical Activity: Not on file  Stress: Not on file  Social Connections: Not on file  Intimate Partner Violence: Not on file     Review of Systems    General:  No chills, fever, night sweats or weight changes.  Cardiovascular:  No chest pain, dyspnea on exertion, edema, orthopnea, palpitations, paroxysmal nocturnal dyspnea. Dermatological: No rash, lesions/masses Respiratory: No cough, dyspnea Urologic: No hematuria, dysuria Abdominal:   No nausea, vomiting, diarrhea, bright red blood per rectum, melena, or hematemesis Neurologic:  No visual changes, wkns, changes in mental status.  Continues to have shuffling gait on occasion, sleeping more during the day than usual. All other systems reviewed and are otherwise negative except as noted above.     Physical Exam    VS:  BP 118/72   Pulse 62   Ht 6' (1.829 m)   Wt 188 lb 6.4 oz (85.5 kg)   SpO2 96%   BMI 25.55 kg/m  , BMI Body mass index is 25.55 kg/m.     GEN: Well nourished, well developed, in no acute distress. HEENT: normal. Neck: Supple, no JVD, carotid bruits, or masses. Cardiac: RRR, no murmurs, rubs, or gallops. No clubbing, cyanosis, edema.  Radials/DP/PT 2+ and equal bilaterally.  Respiratory:  Respirations regular and unlabored, clear to auscultation bilaterally. GI: Soft, nontender, nondistended, BS + x 4. MS: no deformity or atrophy. Skin: warm and dry, no rash. Neuro:  Strength and sensation are intact. Psych: Normal affect.  Answers questions appropriately, smiling and laughing.  Accessory Clinical Findings    ECG personally reviewed by me today-not completed this office visit.  Lab Results   Component Value Date   WBC 7.5 08/07/2019   HGB 12.9 (L) 08/07/2019   HCT 38.1 08/07/2019   MCV 93 08/07/2019   PLT 182 08/07/2019   Lab Results  Component Value Date   CREATININE 0.85 09/05/2019   BUN 16 09/05/2019   NA 141 09/05/2019   K 4.9 09/05/2019   CL 104 09/05/2019   CO2 25 09/05/2019   Lab Results  Component Value Date   ALT 20 10/28/2019   AST 23 10/28/2019   ALKPHOS 74 10/28/2019   BILITOT 0.8 10/28/2019   Lab Results  Component Value Date   CHOL 134 10/28/2019   HDL 53 10/28/2019   LDLCALC 60 10/28/2019   TRIG 121 10/28/2019   CHOLHDL 2.5 10/28/2019    Lab Results  Component Value Date   HGBA1C 5.4 12/04/2018    Review of Prior Studies: Cardiac Monitor 09/08/2022. Patient had a min HR of 45 bpm, max HR of 167 bpm, and avg HR of 62 bpm. Predominant underlying rhythm was Sinus Rhythm. 1 run of Supraventricular Tachycardia occurred lasting 7 beats with a max rate of 167 bpm (avg 161 bpm). Isolated SVEs were rare  (<1.0%), SVE Couplets were rare (<1.0%), and SVE Triplets were rare (<1.0%). Isolated VEs were rare (<1.0%), and no VE Couplets or VE Triplets were present.    Sinus bradycardia, normal sinus rhythm, sinus tachycardia, occasional PAC, 7 beats of SVT, occasional PVC. Olga Millers  Assessment & Plan   1.  Syncopal episode: Review of cardiac monitor only showed 1 episode of PSVT which was very brief.  Average heart rate was in the 60s with no evidence of pauses or significant bradycardia.  Blood pressure is well-controlled.  I will continue him on metoprolol XL 25 mg daily.  2.  Hypercholesterolemia: Remains on statin therapy.  Goal of LDL  less than 70.  Will need follow-up lipids and LFTs completed on next office visit unless completed by PCP.  Most recent labs from our office were completed in 2021 with total cholesterol 134, LDL 60, HDL 53.  Is following with PCP fairly regularly.  3.  Hypertension: Currently well-controlled on metoprolol XL  only.  Continue current medication regimen.  4.  Daytime somnolence Uncertain if this is medication induced versus progression of neurological issues.  Should follow-up with PCP and/or neurology concerning these issues.  Also consideration for low-dose antidepressant as he feels very unmotivated to be active or participate in activities.  Defer to PCP discretion.       Signed, Bettey Mare. Liborio Nixon, ANP, AACC   09/30/2022 12:27 PM      Office [phone] Fax [phone]  Notice: This dictation was prepared with Dragon dictation along with smaller phrase technology. Any transcriptional errors that result from this process are unintentional and may not be corrected upon review.

## 2022-09-29 DIAGNOSIS — J309 Allergic rhinitis, unspecified: Secondary | ICD-10-CM | POA: Diagnosis not present

## 2022-09-29 DIAGNOSIS — I739 Peripheral vascular disease, unspecified: Secondary | ICD-10-CM | POA: Diagnosis not present

## 2022-09-29 DIAGNOSIS — G309 Alzheimer's disease, unspecified: Secondary | ICD-10-CM | POA: Diagnosis not present

## 2022-09-29 DIAGNOSIS — G2581 Restless legs syndrome: Secondary | ICD-10-CM | POA: Diagnosis not present

## 2022-09-29 DIAGNOSIS — Z556 Problems related to health literacy: Secondary | ICD-10-CM | POA: Diagnosis not present

## 2022-09-29 DIAGNOSIS — I10 Essential (primary) hypertension: Secondary | ICD-10-CM | POA: Diagnosis not present

## 2022-09-29 DIAGNOSIS — N138 Other obstructive and reflux uropathy: Secondary | ICD-10-CM | POA: Diagnosis not present

## 2022-09-29 DIAGNOSIS — Z7982 Long term (current) use of aspirin: Secondary | ICD-10-CM | POA: Diagnosis not present

## 2022-09-29 DIAGNOSIS — N401 Enlarged prostate with lower urinary tract symptoms: Secondary | ICD-10-CM | POA: Diagnosis not present

## 2022-09-29 DIAGNOSIS — E785 Hyperlipidemia, unspecified: Secondary | ICD-10-CM | POA: Diagnosis not present

## 2022-09-29 DIAGNOSIS — F4322 Adjustment disorder with anxiety: Secondary | ICD-10-CM | POA: Diagnosis not present

## 2022-09-29 DIAGNOSIS — F0280 Dementia in other diseases classified elsewhere without behavioral disturbance: Secondary | ICD-10-CM | POA: Diagnosis not present

## 2022-09-30 ENCOUNTER — Ambulatory Visit: Payer: Medicare Other | Attending: Adult Health | Admitting: Adult Health

## 2022-09-30 ENCOUNTER — Encounter: Payer: Self-pay | Admitting: Adult Health

## 2022-09-30 VITALS — BP 118/72 | HR 62 | Ht 72.0 in | Wt 188.4 lb

## 2022-09-30 DIAGNOSIS — I2510 Atherosclerotic heart disease of native coronary artery without angina pectoris: Secondary | ICD-10-CM

## 2022-09-30 DIAGNOSIS — R413 Other amnesia: Secondary | ICD-10-CM | POA: Diagnosis not present

## 2022-09-30 DIAGNOSIS — I10 Essential (primary) hypertension: Secondary | ICD-10-CM

## 2022-09-30 DIAGNOSIS — E7800 Pure hypercholesterolemia, unspecified: Secondary | ICD-10-CM | POA: Diagnosis not present

## 2022-09-30 DIAGNOSIS — R400 Somnolence: Secondary | ICD-10-CM

## 2022-09-30 NOTE — Patient Instructions (Signed)
Medication Instructions:  No Changes *If you need a refill on your cardiac medications before your next appointment, please call your pharmacy*   Lab Work: No Labs If you have labs (blood work) drawn today and your tests are completely normal, you will receive your results only by: MyChart Message (if you have MyChart) OR A paper copy in the mail If you have any lab test that is abnormal or we need to change your treatment, we will call you to review the results.   Testing/Procedures: No Testing   Follow-Up: At [facility], you and your health needs are our priority.  As part of our continuing mission to provide you with exceptional heart care, we have created designated Provider Care Teams.  These Care Teams include your primary Cardiologist (physician) and Advanced Practice Providers (APPs -  Physician Assistants and Nurse Practitioners) who all work together to provide you with the care you need, when you need it.  We recommend signing up for the patient portal called "MyChart".  Sign up information is provided on this After Visit Summary.  MyChart is used to connect with patients for Virtual Visits (Telemedicine).  Patients are able to view lab/test results, encounter notes, upcoming appointments, etc.  Non-urgent messages can be sent to your provider as well.   To learn more about what you can do with MyChart, go to [email or web address].    Your next appointment:   6 month(s)  Provider:   Brian Crenshaw, MD   

## 2022-10-04 DIAGNOSIS — G2581 Restless legs syndrome: Secondary | ICD-10-CM | POA: Diagnosis not present

## 2022-10-04 DIAGNOSIS — I10 Essential (primary) hypertension: Secondary | ICD-10-CM | POA: Diagnosis not present

## 2022-10-04 DIAGNOSIS — Z556 Problems related to health literacy: Secondary | ICD-10-CM | POA: Diagnosis not present

## 2022-10-04 DIAGNOSIS — F4322 Adjustment disorder with anxiety: Secondary | ICD-10-CM | POA: Diagnosis not present

## 2022-10-04 DIAGNOSIS — G309 Alzheimer's disease, unspecified: Secondary | ICD-10-CM | POA: Diagnosis not present

## 2022-10-04 DIAGNOSIS — Z7982 Long term (current) use of aspirin: Secondary | ICD-10-CM | POA: Diagnosis not present

## 2022-10-04 DIAGNOSIS — I739 Peripheral vascular disease, unspecified: Secondary | ICD-10-CM | POA: Diagnosis not present

## 2022-10-04 DIAGNOSIS — N138 Other obstructive and reflux uropathy: Secondary | ICD-10-CM | POA: Diagnosis not present

## 2022-10-04 DIAGNOSIS — J309 Allergic rhinitis, unspecified: Secondary | ICD-10-CM | POA: Diagnosis not present

## 2022-10-04 DIAGNOSIS — E785 Hyperlipidemia, unspecified: Secondary | ICD-10-CM | POA: Diagnosis not present

## 2022-10-04 DIAGNOSIS — F0280 Dementia in other diseases classified elsewhere without behavioral disturbance: Secondary | ICD-10-CM | POA: Diagnosis not present

## 2022-10-04 DIAGNOSIS — N401 Enlarged prostate with lower urinary tract symptoms: Secondary | ICD-10-CM | POA: Diagnosis not present

## 2022-10-10 DIAGNOSIS — Z7982 Long term (current) use of aspirin: Secondary | ICD-10-CM | POA: Diagnosis not present

## 2022-10-10 DIAGNOSIS — G2581 Restless legs syndrome: Secondary | ICD-10-CM | POA: Diagnosis not present

## 2022-10-10 DIAGNOSIS — J309 Allergic rhinitis, unspecified: Secondary | ICD-10-CM | POA: Diagnosis not present

## 2022-10-10 DIAGNOSIS — G309 Alzheimer's disease, unspecified: Secondary | ICD-10-CM | POA: Diagnosis not present

## 2022-10-10 DIAGNOSIS — Z556 Problems related to health literacy: Secondary | ICD-10-CM | POA: Diagnosis not present

## 2022-10-10 DIAGNOSIS — I739 Peripheral vascular disease, unspecified: Secondary | ICD-10-CM | POA: Diagnosis not present

## 2022-10-10 DIAGNOSIS — N138 Other obstructive and reflux uropathy: Secondary | ICD-10-CM | POA: Diagnosis not present

## 2022-10-10 DIAGNOSIS — E785 Hyperlipidemia, unspecified: Secondary | ICD-10-CM | POA: Diagnosis not present

## 2022-10-10 DIAGNOSIS — N401 Enlarged prostate with lower urinary tract symptoms: Secondary | ICD-10-CM | POA: Diagnosis not present

## 2022-10-10 DIAGNOSIS — F4322 Adjustment disorder with anxiety: Secondary | ICD-10-CM | POA: Diagnosis not present

## 2022-10-10 DIAGNOSIS — F0280 Dementia in other diseases classified elsewhere without behavioral disturbance: Secondary | ICD-10-CM | POA: Diagnosis not present

## 2022-10-10 DIAGNOSIS — I10 Essential (primary) hypertension: Secondary | ICD-10-CM | POA: Diagnosis not present

## 2022-10-19 DIAGNOSIS — F0280 Dementia in other diseases classified elsewhere without behavioral disturbance: Secondary | ICD-10-CM | POA: Diagnosis not present

## 2022-10-19 DIAGNOSIS — Z6825 Body mass index (BMI) 25.0-25.9, adult: Secondary | ICD-10-CM | POA: Diagnosis not present

## 2022-10-19 DIAGNOSIS — I10 Essential (primary) hypertension: Secondary | ICD-10-CM | POA: Diagnosis not present

## 2022-10-19 DIAGNOSIS — G309 Alzheimer's disease, unspecified: Secondary | ICD-10-CM | POA: Diagnosis not present

## 2022-10-27 DIAGNOSIS — Z9849 Cataract extraction status, unspecified eye: Secondary | ICD-10-CM | POA: Diagnosis not present

## 2022-10-27 DIAGNOSIS — F0390 Unspecified dementia without behavioral disturbance: Secondary | ICD-10-CM | POA: Diagnosis not present

## 2022-11-01 DIAGNOSIS — K080 Exfoliation of teeth due to systemic causes: Secondary | ICD-10-CM | POA: Diagnosis not present

## 2022-12-25 DIAGNOSIS — E161 Other hypoglycemia: Secondary | ICD-10-CM | POA: Diagnosis not present

## 2022-12-25 DIAGNOSIS — I2510 Atherosclerotic heart disease of native coronary artery without angina pectoris: Secondary | ICD-10-CM | POA: Diagnosis not present

## 2022-12-25 DIAGNOSIS — R531 Weakness: Secondary | ICD-10-CM | POA: Diagnosis not present

## 2022-12-25 DIAGNOSIS — U071 COVID-19: Secondary | ICD-10-CM | POA: Diagnosis not present

## 2022-12-25 DIAGNOSIS — R4182 Altered mental status, unspecified: Secondary | ICD-10-CM | POA: Diagnosis not present

## 2022-12-25 DIAGNOSIS — F419 Anxiety disorder, unspecified: Secondary | ICD-10-CM | POA: Diagnosis not present

## 2022-12-25 DIAGNOSIS — F10929 Alcohol use, unspecified with intoxication, unspecified: Secondary | ICD-10-CM | POA: Diagnosis not present

## 2022-12-25 DIAGNOSIS — Z951 Presence of aortocoronary bypass graft: Secondary | ICD-10-CM | POA: Diagnosis not present

## 2022-12-25 DIAGNOSIS — R0902 Hypoxemia: Secondary | ICD-10-CM | POA: Diagnosis not present

## 2022-12-25 DIAGNOSIS — I10 Essential (primary) hypertension: Secondary | ICD-10-CM | POA: Diagnosis not present

## 2022-12-25 DIAGNOSIS — R509 Fever, unspecified: Secondary | ICD-10-CM | POA: Diagnosis not present

## 2022-12-25 DIAGNOSIS — R410 Disorientation, unspecified: Secondary | ICD-10-CM | POA: Diagnosis not present

## 2022-12-28 DIAGNOSIS — F0280 Dementia in other diseases classified elsewhere without behavioral disturbance: Secondary | ICD-10-CM | POA: Diagnosis not present

## 2022-12-28 DIAGNOSIS — U071 COVID-19: Secondary | ICD-10-CM | POA: Diagnosis not present

## 2022-12-28 DIAGNOSIS — R300 Dysuria: Secondary | ICD-10-CM | POA: Diagnosis not present

## 2022-12-28 DIAGNOSIS — J069 Acute upper respiratory infection, unspecified: Secondary | ICD-10-CM | POA: Diagnosis not present

## 2022-12-28 DIAGNOSIS — G309 Alzheimer's disease, unspecified: Secondary | ICD-10-CM | POA: Diagnosis not present

## 2023-01-03 ENCOUNTER — Encounter: Payer: Self-pay | Admitting: Cardiology

## 2023-02-08 DIAGNOSIS — Z6826 Body mass index (BMI) 26.0-26.9, adult: Secondary | ICD-10-CM | POA: Diagnosis not present

## 2023-02-08 DIAGNOSIS — I739 Peripheral vascular disease, unspecified: Secondary | ICD-10-CM | POA: Diagnosis not present

## 2023-02-08 DIAGNOSIS — Z23 Encounter for immunization: Secondary | ICD-10-CM | POA: Diagnosis not present

## 2023-02-08 DIAGNOSIS — F0280 Dementia in other diseases classified elsewhere without behavioral disturbance: Secondary | ICD-10-CM | POA: Diagnosis not present

## 2023-02-08 DIAGNOSIS — G309 Alzheimer's disease, unspecified: Secondary | ICD-10-CM | POA: Diagnosis not present

## 2023-02-20 NOTE — Progress Notes (Signed)
HPI: FU coronary artery disease. He has had a previous PCI of his LAD in 2000. Cath 4/21 showed 100 OM, 99 ostial to proximal LAD followed by 60; and normal LV function; had PCI of LAD with DES.  Admitted to [facility] April 2024 with syncopal episode.  Echocardiogram April 2024 showed normal LV function and grade 1 diastolic dysfunction.  Patient apparently had a nuclear study April 2024 that showed no ischemia.  Monitor May 2024 showed sinus rhythm, occasional PAC, 7 beats of SVT and occasional PVC.  He is being seen by neurology for tremors and gait disturbance.  Since I last saw him, he denies dyspnea, chest pain, palpitations or syncope.  He is having difficulties with dementia.  Current Outpatient Medications  Medication Sig Dispense Refill   acetaminophen (TYLENOL) 500 MG tablet Take 500 mg by mouth every 6 (six) hours as needed for mild pain or moderate pain.     aspirin EC 81 MG tablet Take 81 mg by mouth daily. Swallow whole.     atorvastatin (LIPITOR) 80 MG tablet Take 1 tablet (80 mg total) by mouth daily. 90 tablet 3   Azelastine HCl 137 MCG/SPRAY SOLN Place into both nostrils. AS NEEDED     calcium carbonate (TUMS EX) 750 MG chewable tablet Chew 1 tablet by mouth daily.     CHOLECALCIFEROL PO Take 1 tablet by mouth daily.     DULoxetine (CYMBALTA) 60 MG capsule Take 1 capsule (60 mg total) by mouth daily. 90 capsule 3   memantine (NAMENDA) 10 MG tablet Take 1 tablet (10 mg total) by mouth 2 (two) times daily. 180 tablet 3   METAMUCIL FIBER PO Take by mouth.     metoprolol succinate (TOPROL-XL) 25 MG 24 hr tablet TAKE 1 TABLET BY MOUTH EVERY DAY 90 tablet 3   MYRBETRIQ 50 MG TB24 tablet Take 50 mg by mouth daily.     nitroGLYCERIN (NITROSTAT) 0.4 MG SL tablet Place 1 tablet (0.4 mg total) under the tongue every 5 (five) minutes as needed. 25 tablet 2   Polyethylene Glycol 3350 (MIRALAX PO) Take by mouth as needed.     tamsulosin (FLOMAX) 0.4 MG CAPS capsule Take 0.4 mg by  mouth daily.     No current facility-administered medications for this visit.     Past Medical History:  Diagnosis Date   Anxiety    Arthritis    CAD    Erectile dysfunction    HIATAL HERNIA    HYPERLIPIDEMIA    HYPERTENSION    Numbness in feet     Past Surgical History:  Procedure Laterality Date   CARDIAC CATHETERIZATION     COLONOSCOPY  12/31/2014   Minimal internal hemorrhoids. Otherwise normal colonoscopy   CORONARY STENT INTERVENTION N/A 08/14/2019   Procedure: CORONARY STENT INTERVENTION;  Surgeon: Kathleene Hazel, MD;  Location: [facility];  Service: Cardiovascular;  Laterality: N/A;   LEFT HEART CATH AND CORONARY ANGIOGRAPHY N/A 08/14/2019   Procedure: LEFT HEART CATH AND CORONARY ANGIOGRAPHY;  Surgeon: Kathleene Hazel, MD;  Location: [facility];  Service: Cardiovascular;  Laterality: N/A;   NOSE SURGERY     TRANSURETHRAL RESECTION OF PROSTATE  03/2017    Social History   Socioeconomic History   Marital status: Married    Spouse name: ann   Number of children: 2   Years of education: Not on file   Highest education level: Not on file  Occupational History   Not  on file  Tobacco Use   Smoking status: Never   Smokeless tobacco: Never  Vaping Use   Vaping status: Never Used  Substance and Sexual Activity   Alcohol use: No    Alcohol/week: 0.0 standard drinks of alcohol   Drug use: No   Sexual activity: Yes  Other Topics Concern   Not on file  Social History Narrative   Lives   Caffeine use:    Social Determinants of Health   Financial Resource Strain: Not on file  Food Insecurity: Not on file  Transportation Needs: Not on file  Physical Activity: Not on file  Stress: Not on file  Social Connections: Not on file  Intimate Partner Violence: Not on file    Family History  Problem Relation Age of Onset   Alzheimer's disease Mother        SOME TYPE OF VALUE DISORDER   Heart attack Father    Hypertension Father     Obesity Father    Hypertension Sister    Diabetes Sister    Obesity Sister    Other Daughter        NO HEALTH PROBLEMS   Other Son        NO HEALTH PROBLEMS   Colon cancer Neg Hx    Rectal cancer Neg Hx    Stomach cancer Neg Hx    Esophageal cancer Neg Hx    Pancreatic cancer Neg Hx    Liver cancer Neg Hx     ROS: no fevers or chills, productive cough, hemoptysis, dysphasia, odynophagia, melena, hematochezia, dysuria, hematuria, rash, seizure activity, orthopnea, PND, pedal edema, claudication. Remaining systems are negative.  Physical Exam: Well-developed well-nourished in no acute distress.  Skin is warm and dry.  HEENT is normal.  Neck is supple.  Chest is clear to auscultation with normal expansion.  Cardiovascular exam is regular rate and rhythm.  Abdominal exam nontender or distended. No masses palpated. Extremities show no edema. neuro grossly intact  A/P  1 coronary artery disease-patient denies chest pain.  Continue aspirin and statin.  2 hypertension-patient's blood pressure is controlled.  Continue present medical regimen.  3 hyperlipidemia-continue statin.  4 previous syncopal episode-no recurrences.  LV function normal.  Will follow.  Olga Millers, MD

## 2023-03-02 ENCOUNTER — Encounter: Payer: Self-pay | Admitting: Neurology

## 2023-03-02 ENCOUNTER — Ambulatory Visit: Payer: Medicare Other | Admitting: Neurology

## 2023-03-02 VITALS — BP 132/67 | HR 60 | Ht 73.0 in | Wt 191.0 lb

## 2023-03-02 DIAGNOSIS — G2581 Restless legs syndrome: Secondary | ICD-10-CM

## 2023-03-02 DIAGNOSIS — G3184 Mild cognitive impairment, so stated: Secondary | ICD-10-CM

## 2023-03-02 DIAGNOSIS — R202 Paresthesia of skin: Secondary | ICD-10-CM

## 2023-03-02 NOTE — Progress Notes (Signed)
ASSESSMENT AND PLAN 77 y.o. year old male   Alzheimer dementia  Slow worsening  MoCA examination 21/30 today,  Tolerating Namenda, could not tolerate Aricept in the past due to side effect,  We went over [place] information in detail, more information was provided, he will go over with his family, if decide to proceed, we will contact our office,   Restless leg syndrome  Bilateral lower extremity paresthesia  Cervical spine stenosis  MRI of cervical spine in April 2022 showed multilevel degenerative changes, most prominent C5-6, moderate canal stenosis, bilateral foraminal narrowing  On Cymbalta 60 mg daily  Encouraged him moderate exercise, return to clinic with nurse practitioner in 1 year  DIAGNOSTIC DATA (LABS, IMAGING, TESTING)  MRI of the brain on January 04 2020: Generalized atrophy, microhemorrhage, mild supratentorial and small vessel disease, no acute abnormality.   MRI scan cervical spine without contrast on August 05, 2020 showing prominent spondylitic changes from C3-C7 most prominent at C5-6 where there is moderate canal stenosis and bilateral foraminal narrowing with displacement of cord.  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  MRI thoracic spine in November 2022 showed no significant spinal stenosis, there was multilevel degenerative changes  HISTORY OF PRESENT ILLNESS: Jeremy Simon is a 77 years old male, seen in request by his primary care physician Dr. Leonia Reader, Barbara Cower for evaluation of neuropathy, initial evaluation was on December 04, 2018.   Past medical history of hypertension, hyperlipidemia, coronary artery disease, status post stent   Since 2019, he noticed frequent bilateral calf muscle deep achy pain, especially at nighttime, difficulty falling to sleep, massage, pacing around did not help, he also complains of bilateral feet, toes tingling, he denies burning pain, denies gait abnormality, mild low back pain, denies  significant radiating pain, denies bowel bladder incontinence, he does complains of frequent shoulder achy pain, denies upper extremity paresthesia or weakness, he also has occasionally bilateral posterior thigh muscle cramping  Laboratory evaluation in February 2020 showed normal CBC, CMP with exception of mild elevated glucose 116, normal TSH, LDL was 53   EMG nerve conduction study [facility] orthopedics on July 20, 2018 showed bilateral axonal peripheral polyneuropathy   He started on Requip 1 mg 2 tablets every night since July 2020, not sure about the benefit, he can go to sleep without much difficulty, taking trazodone 50 mg every night too, but he often woke up at 4 AM, is hard for him to find a comfortable position, previously has tried gabapentin 300 mg every night without helping his symptoms.   Laboratory evaluation in July 2020 showed normal negative protein electrophoresis, vitamin D, ferritin, C-reactive protein, B12, ESR, RPR, ANA, A1c was 5.4   He is also concerned about his mild memory loss, his mother suffered dementia in his 38s, occasionally slip of memory, highly functional has no difficulty handling his daily activity, today's Mini-Mental Status Examination is 30 out of 30.   MRI of lumbar in August 2020: Multilevel degenerative changes, hemangioma within L2, L4, no significant canal or foraminal narrowing   UPDATE July 28 2020: With change of his medication, Requip 1 mg every night, he can sleep better, he complains of slow worsening bilateral lower extremity weakness, increased bilateral feet numbness tingling, especially during the early morning, it is painful to take the first few steps, he has to be careful not to 4  He had few years history of slow worsening urinary incontinence, has frequent nocturnal accident, is treated with  Flomax 0.4 mg daily, Myrbetriq 25 mg every night, he has to set up clock every 3 hours to remind him using bathroom in the middle of the  sleep  He had a coronary artery disease, status post stent April 2021, overlap with aspirin 81 mg plus Plavix 75 mg daily for 1 year, we will stop Plavix in May 2022  We personally reviewed MRI of brain without contrast, generalized atrophy, scattered microhemorrhage, mild supratentorial and small vessel disease.  Laboratory evaluation on July 03, 2020, normal hemoglobin 14.4, CMP, creatinine of 0.9, A1c of 5.5, LDL of 63   Update August 05, 2020 EMG nerve conduction study today is normal, there is no evidence of large fiber peripheral neuropathy, no evidence of right cervical radiculopathy  Continue complains of worsening gait abnormality, brisk reflex especially at bilateral patella, intermittent bilateral hands paresthesia, also has worsening urinary incontinence, MRI of cervical spine to rule out cervical spondylitic myelopathy, pending for August 13, 2020  He sleeps well with Requip 1 mg 2 tablets every night, will try to decrease to 1 mg every night, continue has worsening urinary urgency, frequency, that is helped by Myrbetriq  UPDATE August 19 2020: He is accompanied by his wife at today's clinical visit, continued complaints of lower extremity paresthesia, mildly unsteady gait urinary urgency, but denies incontinence  Personally reviewed MRI of cervical spine on August 13, 2020, prominent spondylitic changes from C3-7, most prominent at C5-6, there is moderate canal stenosis bilateral foraminal narrowing, disc displacement of the cord, C4-5 also's showed significant degenerative changes with moderate left-sided foraminal narrowing  UPDATE Feb 27 2021: His restless leg symptoms has much improved,  denies difficulty sleeping, but he has to set up alarm clock to wake him up every 2 hours to use bathroom, few times each month, he would have urinary accident during sleep, sometimes he took his Requip 1 mg 2 tablets 5 AM when he wakes up early morning using bathroom, which seems to help his  bilateral feet paresthesia,  He is not very physically active, have low back pain, continue has mild gait abnormality, chronic constipation,  Slow worsening memory loss, MoCA examination 22/30 today,  UPDATE Feb 28 2022: He is accompanied by his wife at today's clinical visit, overall stable, MoCA examination 21/30, tolerating Namenda at 10 mg twice a day  He was seen by scoliosis clinic February 16, 2022 had a radiofrequency ablation, still has neck stiffness, taking Norco 50 mg twice a day as needed  He sleeps well compared to previous, taking Requip 1 mg every night, falling to sleep quickly, but set her alarm every 2 hours for urination, worried about the nocturnal enuresis  He denies lower snoring, wife noticed that he has body tremor excessive movement occasionally,   UPDATE Mar 02 2023: Accompanied by his wife at today's clinical visit, overall doing well, walk regularly, sleeps well has good appetite, tolerating them resurrect 10 mg twice a day, MoCA examination 21/30 today, patient and wife is interested in knowing the new medication for Alzheimer's disease, we spent time discussing Leqembi, information was provided,   He fell in February 2024 exerting himself after prolonged walking, admitted to [facility], 08/10/22 he presented after a near syncopal event with facial abrasions and nasal fracture.  CT maxillofacial showed proximal nasal bone fracture.  Troponin initially 0.06, 3 hours increased 0.09.  He was admitted for observation of troponins.  WBC 11.5, glucose 127, creatinine 0.90.    PHYSICAL EXAM  Vitals:   08/19/20  1447  BP: 138/65  Pulse: 61  Weight: 180 lb 8 oz (81.9 kg)  Height: 6' (1.829 m)   Body mass index is 24.48 kg/m.  Generalized: Well developed, in no acute distress, well dressed     03/02/2023   10:25 AM 08/31/2022    9:53 AM 02/28/2022   10:04 AM 08/26/2021    9:07 AM 02/25/2021    7:49 AM  Montreal Cognitive Assessment   Visuospatial/  Executive (0/5) 5 4 4 4 3   Naming (0/3) 3 2 3 3 3   Attention: Read list of digits (0/2) 2 1 2 2 2   Attention: Read list of letters (0/1) 1 0 1 1 1   Attention: Serial 7 subtraction starting at 100 (0/3) 3 1 3 3 3   Language: Repeat phrase (0/2) 1 0 1 1 2   Language : Fluency (0/1) 0 1 0 0 0  Abstraction (0/2) 2 1 1 1 2   Delayed Recall (0/5) 0 0 0 0 0  Orientation (0/6) 4 6 6 6 6   Total 21 16 21 21 22   Adjusted Score (based on education) 21  21 21 22     Physical Exam  General: The patient is alert and cooperative at the time of the examination.  Skin: No significant peripheral edema is noted.  Neurologic Exam  Mental status: The patient is alert and oriented x 3 at the time of the examination. The patient has apparent normal recent and remote memory, with an apparently normal attention span and concentration ability. No obvious inaccuracies with history.  Wife provides most history. Takes extended time to provide history, speech is somewhat slow.  Cranial nerves: Facial symmetry is present. Speech is normal, no aphasia or dysarthria is noted. Extraocular movements are full. Visual fields are full.  Motor: The patient has good strength in all 4 extremities.  Sensory examination: Soft touch sensation is symmetric on the face, arms, and legs.  Coordination: The patient has good finger-nose-finger and heel-to-shin bilaterally.  Gait and station: Can stand from seated position without pushoff, gait is slightly wide-based, somewhat slow, good strides.  Has a quad cane he carries but does not touch the ground.  Reflexes: Deep tendon reflexes are symmetric and normal.  REVIEW OF SYSTEMS: Out of a complete 14 system review of symptoms, the patient complains only of the following symptoms, and all other reviewed systems are negative.  See HPI  ALLERGIES: Allergies  Allergen Reactions   Altace [Ramipril]     Hives, lip swelling   Norvasc [Amlodipine] Swelling    Left foot swelling     HOME MEDICATIONS: Outpatient Medications Prior to Visit  Medication Sig Dispense Refill   acetaminophen (TYLENOL) 500 MG tablet Take 500 mg by mouth every 6 (six) hours as needed for mild pain or moderate pain.     aspirin EC 81 MG tablet Take 81 mg by mouth daily. Swallow whole.     atorvastatin (LIPITOR) 80 MG tablet Take 1 tablet (80 mg total) by mouth daily. 90 tablet 3   Azelastine HCl 137 MCG/SPRAY SOLN Place into both nostrils. AS NEEDED     calcium carbonate (TUMS EX) 750 MG chewable tablet Chew 1 tablet by mouth daily.     CHOLECALCIFEROL PO Take 1 tablet by mouth daily.     DULoxetine (CYMBALTA) 60 MG capsule Take 1 capsule (60 mg total) by mouth daily. 90 capsule 3   memantine (NAMENDA) 10 MG tablet Take 1 tablet (10 mg total) by mouth 2 (two) times daily. 180  tablet 3   METAMUCIL FIBER PO Take by mouth.     metoprolol succinate (TOPROL-XL) 25 MG 24 hr tablet TAKE 1 TABLET BY MOUTH EVERY DAY 90 tablet 3   MYRBETRIQ 50 MG TB24 tablet Take 50 mg by mouth daily.     Polyethylene Glycol 3350 (MIRALAX PO) Take by mouth as needed.     tamsulosin (FLOMAX) 0.4 MG CAPS capsule Take 0.4 mg by mouth daily.     nitroGLYCERIN (NITROSTAT) 0.4 MG SL tablet Place 1 tablet (0.4 mg total) under the tongue every 5 (five) minutes as needed. (Patient not taking: Reported on 03/02/2023) 25 tablet 2   fluticasone (FLONASE) 50 MCG/ACT nasal spray Place 2 sprays into both nostrils daily.     No facility-administered medications prior to visit.    PAST MEDICAL HISTORY: Past Medical History:  Diagnosis Date   Anxiety    Arthritis    CAD    Erectile dysfunction    HIATAL HERNIA    HYPERLIPIDEMIA    HYPERTENSION    Numbness in feet     PAST SURGICAL HISTORY: Past Surgical History:  Procedure Laterality Date   CARDIAC CATHETERIZATION     COLONOSCOPY  12/31/2014   Minimal internal hemorrhoids. Otherwise normal colonoscopy   CORONARY STENT INTERVENTION N/A 08/14/2019   Procedure: CORONARY  STENT INTERVENTION;  Surgeon: Kathleene Hazel, MD;  Location: [facility] INVASIVE CV LAB;  Service: Cardiovascular;  Laterality: N/A;   LEFT HEART CATH AND CORONARY ANGIOGRAPHY N/A 08/14/2019   Procedure: LEFT HEART CATH AND CORONARY ANGIOGRAPHY;  Surgeon: Kathleene Hazel, MD;  Location: [facility] INVASIVE CV LAB;  Service: Cardiovascular;  Laterality: N/A;   NOSE SURGERY     TRANSURETHRAL RESECTION OF PROSTATE  03/2017    FAMILY HISTORY: Family History  Problem Relation Age of Onset   Alzheimer's disease Mother        SOME TYPE OF VALUE DISORDER   Heart attack Father    Hypertension Father    Obesity Father    Hypertension Sister    Diabetes Sister    Obesity Sister    Other Daughter        NO HEALTH PROBLEMS   Other Son        NO HEALTH PROBLEMS   Colon cancer Neg Hx    Rectal cancer Neg Hx    Stomach cancer Neg Hx    Esophageal cancer Neg Hx    Pancreatic cancer Neg Hx    Liver cancer Neg Hx     SOCIAL HISTORY: Social History   Socioeconomic History   Marital status: Married    Spouse name: ann   Number of children: 2   Years of education: Not on file   Highest education level: Not on file  Occupational History   Not on file  Tobacco Use   Smoking status: Never   Smokeless tobacco: Never  Vaping Use   Vaping status: Never Used  Substance and Sexual Activity   Alcohol use: No    Alcohol/week: 0.0 standard drinks of alcohol   Drug use: No   Sexual activity: Yes  Other Topics Concern   Not on file  Social History Narrative   Lives   Caffeine use:    Social Determinants of Health   Financial Resource Strain: Not on file  Food Insecurity: Not on file  Transportation Needs: Not on file  Physical Activity: Not on file  Stress: Not on file  Social Connections: Not on file  Intimate Partner Violence: Not  on file    Levert Feinstein, M.D. Ph.D.  [facility] [address] [place], [place] [address] Phone: [phone] Fax:      [phone]    Total time spent reviewing the chart, obtaining history, examined patient, ordering tests, documentation, consultations and family, care coordination was  55 mins

## 2023-03-02 NOTE — Patient Instructions (Signed)
Leqembi (lecanemab-irmb) injection ([email or web address])   [employer]

## 2023-03-06 ENCOUNTER — Ambulatory Visit: Payer: Medicare Other | Attending: Cardiology | Admitting: Cardiology

## 2023-03-06 ENCOUNTER — Encounter: Payer: Self-pay | Admitting: Cardiology

## 2023-03-06 VITALS — BP 118/62 | HR 62 | Ht 73.0 in | Wt 181.0 lb

## 2023-03-06 DIAGNOSIS — I2510 Atherosclerotic heart disease of native coronary artery without angina pectoris: Secondary | ICD-10-CM

## 2023-03-06 DIAGNOSIS — I10 Essential (primary) hypertension: Secondary | ICD-10-CM

## 2023-03-06 DIAGNOSIS — E7800 Pure hypercholesterolemia, unspecified: Secondary | ICD-10-CM | POA: Diagnosis not present

## 2023-03-06 MED ORDER — NITROGLYCERIN 0.4 MG SL SUBL: 0.4000 mg | SUBLINGUAL_TABLET | SUBLINGUAL | 11 refills | Status: AC | PRN

## 2023-03-06 NOTE — Patient Instructions (Signed)
Medication Instructions: Refill sent to pharmacy for nitroglycerin SL. *If you need a refill on your cardiac medications before your next appointment, please call your pharmacy*   Follow-Up: At [facility], you and your health needs are our priority.  As part of our continuing mission to provide you with exceptional heart care, we have created designated Provider Care Teams.  These Care Teams include your primary Cardiologist (physician) and Advanced Practice Providers (APPs -  Physician Assistants and Nurse Practitioners) who all work together to provide you with the care you need, when you need it.  We recommend signing up for the patient portal called "MyChart".  Sign up information is provided on this After Visit Summary.  MyChart is used to connect with patients for Virtual Visits (Telemedicine).  Patients are able to view lab/test results, encounter notes, upcoming appointments, etc.  Non-urgent messages can be sent to your provider as well.   To learn more about what you can do with MyChart, go to [email or web address].    Your next appointment:   12 month(s)  Provider:

## 2023-04-13 DIAGNOSIS — R3915 Urgency of urination: Secondary | ICD-10-CM | POA: Diagnosis not present

## 2023-04-13 DIAGNOSIS — R350 Frequency of micturition: Secondary | ICD-10-CM | POA: Diagnosis not present

## 2023-04-14 ENCOUNTER — Ambulatory Visit: Payer: Medicare Other | Admitting: Cardiology

## 2023-05-15 DIAGNOSIS — N138 Other obstructive and reflux uropathy: Secondary | ICD-10-CM | POA: Diagnosis not present

## 2023-05-15 DIAGNOSIS — F0280 Dementia in other diseases classified elsewhere without behavioral disturbance: Secondary | ICD-10-CM | POA: Diagnosis not present

## 2023-05-15 DIAGNOSIS — N401 Enlarged prostate with lower urinary tract symptoms: Secondary | ICD-10-CM | POA: Diagnosis not present

## 2023-05-15 DIAGNOSIS — G309 Alzheimer's disease, unspecified: Secondary | ICD-10-CM | POA: Diagnosis not present

## 2023-06-07 DIAGNOSIS — K080 Exfoliation of teeth due to systemic causes: Secondary | ICD-10-CM | POA: Diagnosis not present

## 2023-06-26 DIAGNOSIS — H524 Presbyopia: Secondary | ICD-10-CM | POA: Diagnosis not present

## 2023-06-26 DIAGNOSIS — G501 Atypical facial pain: Secondary | ICD-10-CM | POA: Diagnosis not present

## 2023-06-26 DIAGNOSIS — Z961 Presence of intraocular lens: Secondary | ICD-10-CM | POA: Diagnosis not present

## 2023-06-26 DIAGNOSIS — S01111A Laceration without foreign body of right eyelid and periocular area, initial encounter: Secondary | ICD-10-CM | POA: Diagnosis not present

## 2023-07-25 DIAGNOSIS — Z125 Encounter for screening for malignant neoplasm of prostate: Secondary | ICD-10-CM | POA: Diagnosis not present

## 2023-07-25 DIAGNOSIS — E785 Hyperlipidemia, unspecified: Secondary | ICD-10-CM | POA: Diagnosis not present

## 2023-07-25 DIAGNOSIS — Z79899 Other long term (current) drug therapy: Secondary | ICD-10-CM | POA: Diagnosis not present

## 2023-07-25 DIAGNOSIS — F0280 Dementia in other diseases classified elsewhere without behavioral disturbance: Secondary | ICD-10-CM | POA: Diagnosis not present

## 2023-07-25 DIAGNOSIS — Z0000 Encounter for general adult medical examination without abnormal findings: Secondary | ICD-10-CM | POA: Diagnosis not present

## 2023-07-25 DIAGNOSIS — I739 Peripheral vascular disease, unspecified: Secondary | ICD-10-CM | POA: Diagnosis not present

## 2023-07-25 DIAGNOSIS — R7302 Impaired glucose tolerance (oral): Secondary | ICD-10-CM | POA: Diagnosis not present

## 2023-07-25 DIAGNOSIS — G309 Alzheimer's disease, unspecified: Secondary | ICD-10-CM | POA: Diagnosis not present

## 2023-09-05 DIAGNOSIS — R350 Frequency of micturition: Secondary | ICD-10-CM | POA: Diagnosis not present

## 2023-09-13 ENCOUNTER — Other Ambulatory Visit: Payer: Self-pay | Admitting: Neurology

## 2023-10-19 DIAGNOSIS — Z6825 Body mass index (BMI) 25.0-25.9, adult: Secondary | ICD-10-CM | POA: Diagnosis not present

## 2023-10-19 DIAGNOSIS — H6123 Impacted cerumen, bilateral: Secondary | ICD-10-CM | POA: Diagnosis not present

## 2023-10-19 DIAGNOSIS — H6122 Impacted cerumen, left ear: Secondary | ICD-10-CM | POA: Diagnosis not present

## 2023-10-19 DIAGNOSIS — R109 Unspecified abdominal pain: Secondary | ICD-10-CM | POA: Diagnosis not present

## 2023-10-19 DIAGNOSIS — H6121 Impacted cerumen, right ear: Secondary | ICD-10-CM | POA: Diagnosis not present

## 2023-10-20 DIAGNOSIS — T473X5A Adverse effect of saline and osmotic laxatives, initial encounter: Secondary | ICD-10-CM | POA: Diagnosis not present

## 2023-10-20 DIAGNOSIS — N401 Enlarged prostate with lower urinary tract symptoms: Secondary | ICD-10-CM | POA: Diagnosis not present

## 2023-10-20 DIAGNOSIS — R410 Disorientation, unspecified: Secondary | ICD-10-CM | POA: Diagnosis not present

## 2023-10-20 DIAGNOSIS — R4189 Other symptoms and signs involving cognitive functions and awareness: Secondary | ICD-10-CM | POA: Diagnosis not present

## 2023-10-20 DIAGNOSIS — Z79899 Other long term (current) drug therapy: Secondary | ICD-10-CM | POA: Diagnosis not present

## 2023-10-20 DIAGNOSIS — R0902 Hypoxemia: Secondary | ICD-10-CM | POA: Diagnosis not present

## 2023-10-20 DIAGNOSIS — R109 Unspecified abdominal pain: Secondary | ICD-10-CM | POA: Diagnosis not present

## 2023-10-20 DIAGNOSIS — R2681 Unsteadiness on feet: Secondary | ICD-10-CM | POA: Diagnosis not present

## 2023-10-20 DIAGNOSIS — G2581 Restless legs syndrome: Secondary | ICD-10-CM | POA: Diagnosis not present

## 2023-10-20 DIAGNOSIS — Z7401 Bed confinement status: Secondary | ICD-10-CM | POA: Diagnosis not present

## 2023-10-20 DIAGNOSIS — N289 Disorder of kidney and ureter, unspecified: Secondary | ICD-10-CM | POA: Diagnosis not present

## 2023-10-20 DIAGNOSIS — F03C3 Unspecified dementia, severe, with mood disturbance: Secondary | ICD-10-CM | POA: Diagnosis not present

## 2023-10-20 DIAGNOSIS — F0390 Unspecified dementia without behavioral disturbance: Secondary | ICD-10-CM | POA: Diagnosis not present

## 2023-10-20 DIAGNOSIS — E860 Dehydration: Secondary | ICD-10-CM | POA: Diagnosis not present

## 2023-10-20 DIAGNOSIS — M6281 Muscle weakness (generalized): Secondary | ICD-10-CM | POA: Diagnosis not present

## 2023-10-20 DIAGNOSIS — N400 Enlarged prostate without lower urinary tract symptoms: Secondary | ICD-10-CM | POA: Diagnosis not present

## 2023-10-20 DIAGNOSIS — F03C4 Unspecified dementia, severe, with anxiety: Secondary | ICD-10-CM | POA: Diagnosis not present

## 2023-10-20 DIAGNOSIS — N201 Calculus of ureter: Secondary | ICD-10-CM | POA: Diagnosis not present

## 2023-10-20 DIAGNOSIS — N179 Acute kidney failure, unspecified: Secondary | ICD-10-CM | POA: Diagnosis not present

## 2023-10-20 DIAGNOSIS — Z7982 Long term (current) use of aspirin: Secondary | ICD-10-CM | POA: Diagnosis not present

## 2023-10-20 DIAGNOSIS — I10 Essential (primary) hypertension: Secondary | ICD-10-CM | POA: Diagnosis not present

## 2023-10-20 DIAGNOSIS — N132 Hydronephrosis with renal and ureteral calculous obstruction: Secondary | ICD-10-CM | POA: Diagnosis not present

## 2023-10-20 DIAGNOSIS — I2510 Atherosclerotic heart disease of native coronary artery without angina pectoris: Secondary | ICD-10-CM | POA: Diagnosis not present

## 2023-10-20 DIAGNOSIS — M898X8 Other specified disorders of bone, other site: Secondary | ICD-10-CM | POA: Diagnosis not present

## 2023-10-20 DIAGNOSIS — K521 Toxic gastroenteritis and colitis: Secondary | ICD-10-CM | POA: Diagnosis not present

## 2023-10-26 DIAGNOSIS — F0390 Unspecified dementia without behavioral disturbance: Secondary | ICD-10-CM | POA: Diagnosis not present

## 2023-10-26 DIAGNOSIS — R0902 Hypoxemia: Secondary | ICD-10-CM | POA: Diagnosis not present

## 2023-10-26 DIAGNOSIS — F03C3 Unspecified dementia, severe, with mood disturbance: Secondary | ICD-10-CM | POA: Diagnosis not present

## 2023-10-26 DIAGNOSIS — N201 Calculus of ureter: Secondary | ICD-10-CM | POA: Diagnosis not present

## 2023-10-26 DIAGNOSIS — N401 Enlarged prostate with lower urinary tract symptoms: Secondary | ICD-10-CM | POA: Diagnosis not present

## 2023-10-26 DIAGNOSIS — N289 Disorder of kidney and ureter, unspecified: Secondary | ICD-10-CM | POA: Diagnosis not present

## 2023-10-26 DIAGNOSIS — N132 Hydronephrosis with renal and ureteral calculous obstruction: Secondary | ICD-10-CM | POA: Diagnosis not present

## 2023-10-26 DIAGNOSIS — M6281 Muscle weakness (generalized): Secondary | ICD-10-CM | POA: Diagnosis not present

## 2023-10-26 DIAGNOSIS — R410 Disorientation, unspecified: Secondary | ICD-10-CM | POA: Diagnosis not present

## 2023-10-26 DIAGNOSIS — R2681 Unsteadiness on feet: Secondary | ICD-10-CM | POA: Diagnosis not present

## 2023-10-26 DIAGNOSIS — I10 Essential (primary) hypertension: Secondary | ICD-10-CM | POA: Diagnosis not present

## 2023-10-26 DIAGNOSIS — G2581 Restless legs syndrome: Secondary | ICD-10-CM | POA: Diagnosis not present

## 2023-10-26 DIAGNOSIS — K521 Toxic gastroenteritis and colitis: Secondary | ICD-10-CM | POA: Diagnosis not present

## 2023-10-26 DIAGNOSIS — Z7401 Bed confinement status: Secondary | ICD-10-CM | POA: Diagnosis not present

## 2023-10-26 DIAGNOSIS — N179 Acute kidney failure, unspecified: Secondary | ICD-10-CM | POA: Diagnosis not present

## 2023-10-26 DIAGNOSIS — R4189 Other symptoms and signs involving cognitive functions and awareness: Secondary | ICD-10-CM | POA: Diagnosis not present

## 2023-11-15 DIAGNOSIS — R351 Nocturia: Secondary | ICD-10-CM | POA: Diagnosis not present

## 2023-11-15 DIAGNOSIS — N201 Calculus of ureter: Secondary | ICD-10-CM | POA: Diagnosis not present

## 2023-11-15 DIAGNOSIS — N401 Enlarged prostate with lower urinary tract symptoms: Secondary | ICD-10-CM | POA: Diagnosis not present

## 2023-11-23 DIAGNOSIS — G309 Alzheimer's disease, unspecified: Secondary | ICD-10-CM | POA: Diagnosis not present

## 2023-11-23 DIAGNOSIS — M898X9 Other specified disorders of bone, unspecified site: Secondary | ICD-10-CM | POA: Diagnosis not present

## 2023-11-23 DIAGNOSIS — Z6825 Body mass index (BMI) 25.0-25.9, adult: Secondary | ICD-10-CM | POA: Diagnosis not present

## 2023-11-24 DIAGNOSIS — F0390 Unspecified dementia without behavioral disturbance: Secondary | ICD-10-CM | POA: Diagnosis not present

## 2023-11-24 DIAGNOSIS — I10 Essential (primary) hypertension: Secondary | ICD-10-CM | POA: Diagnosis not present

## 2023-11-24 DIAGNOSIS — Z888 Allergy status to other drugs, medicaments and biological substances status: Secondary | ICD-10-CM | POA: Diagnosis not present

## 2023-11-24 DIAGNOSIS — Z466 Encounter for fitting and adjustment of urinary device: Secondary | ICD-10-CM | POA: Diagnosis not present

## 2023-11-24 DIAGNOSIS — N400 Enlarged prostate without lower urinary tract symptoms: Secondary | ICD-10-CM | POA: Diagnosis not present

## 2023-11-24 DIAGNOSIS — Z79899 Other long term (current) drug therapy: Secondary | ICD-10-CM | POA: Diagnosis not present

## 2023-11-24 DIAGNOSIS — Z792 Long term (current) use of antibiotics: Secondary | ICD-10-CM | POA: Diagnosis not present

## 2023-11-24 DIAGNOSIS — N200 Calculus of kidney: Secondary | ICD-10-CM | POA: Diagnosis not present

## 2023-11-30 DIAGNOSIS — N201 Calculus of ureter: Secondary | ICD-10-CM | POA: Diagnosis not present

## 2023-11-30 DIAGNOSIS — Z9889 Other specified postprocedural states: Secondary | ICD-10-CM | POA: Diagnosis not present

## 2023-12-01 DIAGNOSIS — N201 Calculus of ureter: Secondary | ICD-10-CM | POA: Diagnosis not present

## 2023-12-01 DIAGNOSIS — N401 Enlarged prostate with lower urinary tract symptoms: Secondary | ICD-10-CM | POA: Diagnosis not present

## 2023-12-11 DIAGNOSIS — N201 Calculus of ureter: Secondary | ICD-10-CM | POA: Diagnosis not present

## 2023-12-12 DIAGNOSIS — N201 Calculus of ureter: Secondary | ICD-10-CM | POA: Diagnosis not present

## 2023-12-12 DIAGNOSIS — N401 Enlarged prostate with lower urinary tract symptoms: Secondary | ICD-10-CM | POA: Diagnosis not present

## 2023-12-28 ENCOUNTER — Emergency Department (HOSPITAL_COMMUNITY)
Admission: EM | Admit: 2023-12-28 | Discharge: 2023-12-28 | Disposition: A | Discharge status: Home / Self Care | Attending: Emergency Medicine | Admitting: Emergency Medicine

## 2023-12-28 ENCOUNTER — Emergency Department (HOSPITAL_COMMUNITY)

## 2023-12-28 DIAGNOSIS — R464 Slowness and poor responsiveness: Secondary | ICD-10-CM

## 2023-12-28 DIAGNOSIS — I10 Essential (primary) hypertension: Secondary | ICD-10-CM | POA: Insufficient documentation

## 2023-12-28 DIAGNOSIS — I6782 Cerebral ischemia: Secondary | ICD-10-CM | POA: Insufficient documentation

## 2023-12-28 DIAGNOSIS — F0280 Dementia in other diseases classified elsewhere without behavioral disturbance: Secondary | ICD-10-CM | POA: Diagnosis not present

## 2023-12-28 DIAGNOSIS — G319 Degenerative disease of nervous system, unspecified: Secondary | ICD-10-CM | POA: Diagnosis not present

## 2023-12-28 DIAGNOSIS — R569 Unspecified convulsions: Secondary | ICD-10-CM | POA: Diagnosis not present

## 2023-12-28 DIAGNOSIS — R9082 White matter disease, unspecified: Secondary | ICD-10-CM | POA: Diagnosis not present

## 2023-12-28 DIAGNOSIS — G309 Alzheimer's disease, unspecified: Secondary | ICD-10-CM | POA: Insufficient documentation

## 2023-12-28 DIAGNOSIS — Z7982 Long term (current) use of aspirin: Secondary | ICD-10-CM | POA: Diagnosis not present

## 2023-12-28 DIAGNOSIS — Z8249 Family history of ischemic heart disease and other diseases of the circulatory system: Secondary | ICD-10-CM | POA: Diagnosis not present

## 2023-12-28 DIAGNOSIS — I6523 Occlusion and stenosis of bilateral carotid arteries: Secondary | ICD-10-CM | POA: Diagnosis not present

## 2023-12-28 DIAGNOSIS — F0390 Unspecified dementia without behavioral disturbance: Secondary | ICD-10-CM | POA: Insufficient documentation

## 2023-12-28 DIAGNOSIS — R030 Elevated blood-pressure reading, without diagnosis of hypertension: Secondary | ICD-10-CM

## 2023-12-28 DIAGNOSIS — J341 Cyst and mucocele of nose and nasal sinus: Secondary | ICD-10-CM | POA: Diagnosis not present

## 2023-12-28 DIAGNOSIS — I2510 Atherosclerotic heart disease of native coronary artery without angina pectoris: Secondary | ICD-10-CM | POA: Diagnosis not present

## 2023-12-28 DIAGNOSIS — R4182 Altered mental status, unspecified: Secondary | ICD-10-CM | POA: Insufficient documentation

## 2023-12-28 DIAGNOSIS — R29818 Other symptoms and signs involving the nervous system: Secondary | ICD-10-CM | POA: Diagnosis not present

## 2023-12-28 DIAGNOSIS — Z79899 Other long term (current) drug therapy: Secondary | ICD-10-CM | POA: Insufficient documentation

## 2023-12-28 DIAGNOSIS — Z8659 Personal history of other mental and behavioral disorders: Secondary | ICD-10-CM

## 2023-12-28 LAB — DIFFERENTIAL
Abs Immature Granulocytes: 0.01 K/uL (ref 0.00–0.07)
Basophils Absolute: 0 K/uL (ref 0.0–0.1)
Basophils Relative: 1 %
Eosinophils Absolute: 0.2 K/uL (ref 0.0–0.5)
Eosinophils Relative: 3 %
Immature Granulocytes: 0 %
Lymphocytes Relative: 22 %
Lymphs Abs: 1.3 K/uL (ref 0.7–4.0)
Monocytes Absolute: 0.5 K/uL (ref 0.1–1.0)
Monocytes Relative: 8 %
Neutro Abs: 3.6 K/uL (ref 1.7–7.7)
Neutrophils Relative %: 66 %

## 2023-12-28 LAB — URINALYSIS, W/ REFLEX TO CULTURE (INFECTION SUSPECTED)
Bilirubin Urine: NEGATIVE
Glucose, UA: NEGATIVE mg/dL
Ketones, ur: NEGATIVE mg/dL
Leukocytes,Ua: NEGATIVE
Nitrite: NEGATIVE
Protein, ur: NEGATIVE mg/dL
RBC / HPF: >50 RBC/hpf (ref 0–5)
Specific Gravity, Urine: 1.025 (ref 1.005–1.030)
pH: 6 (ref 5.0–8.0)

## 2023-12-28 LAB — RAPID URINE DRUG SCREEN, HOSP PERFORMED
Amphetamines: NOT DETECTED
Barbiturates: NOT DETECTED
Benzodiazepines: NOT DETECTED
Cocaine: NOT DETECTED
Opiates: NOT DETECTED
Tetrahydrocannabinol: NOT DETECTED

## 2023-12-28 LAB — CBC
HCT: 41.1 % (ref 39.0–52.0)
Hemoglobin: 13.8 g/dL (ref 13.0–17.0)
MCH: 30.8 pg (ref 26.0–34.0)
MCHC: 33.6 g/dL (ref 30.0–36.0)
MCV: 91.7 fL (ref 80.0–100.0)
Platelets: 195 K/uL (ref 150–400)
RBC: 4.48 MIL/uL (ref 4.22–5.81)
RDW: 12.8 % (ref 11.5–15.5)
WBC: 5.6 K/uL (ref 4.0–10.5)
nRBC: 0 % (ref 0.0–0.2)

## 2023-12-28 LAB — COMPREHENSIVE METABOLIC PANEL WITH GFR
ALT: 21 U/L (ref 0–44)
AST: 26 U/L (ref 15–41)
Albumin: 3.3 g/dL — ABNORMAL LOW (ref 3.5–5.0)
Alkaline Phosphatase: 91 U/L (ref 38–126)
Anion gap: 10 (ref 5–15)
BUN: 11 mg/dL (ref 8–23)
CO2: 24 mmol/L (ref 22–32)
Calcium: 8.8 mg/dL — ABNORMAL LOW (ref 8.9–10.3)
Chloride: 109 mmol/L (ref 98–111)
Creatinine, Ser: 0.91 mg/dL (ref 0.61–1.24)
GFR, Estimated: >60 mL/min (ref >60)
Glucose, Bld: 104 mg/dL — ABNORMAL HIGH (ref 70–99)
Potassium: 4.7 mmol/L (ref 3.5–5.1)
Sodium: 143 mmol/L (ref 135–145)
Total Bilirubin: 1.5 mg/dL — ABNORMAL HIGH (ref 0.0–1.2)
Total Protein: 5.6 g/dL — ABNORMAL LOW (ref 6.5–8.1)

## 2023-12-28 LAB — I-STAT CHEM 8, ED
BUN: 14 mg/dL (ref 8–23)
Calcium, Ion: 1.09 mmol/L — ABNORMAL LOW (ref 1.15–1.40)
Chloride: 107 mmol/L (ref 98–111)
Creatinine, Ser: 0.9 mg/dL (ref 0.61–1.24)
Glucose, Bld: 99 mg/dL (ref 70–99)
HCT: 37 % — ABNORMAL LOW (ref 39.0–52.0)
Hemoglobin: 12.6 g/dL — ABNORMAL LOW (ref 13.0–17.0)
Potassium: 4.7 mmol/L (ref 3.5–5.1)
Sodium: 142 mmol/L (ref 135–145)
TCO2: 25 mmol/L (ref 22–32)

## 2023-12-28 LAB — CBG MONITORING, ED: Glucose-Capillary: 103 mg/dL — ABNORMAL HIGH (ref 70–99)

## 2023-12-28 LAB — ETHANOL: Alcohol, Ethyl (B): <15 mg/dL (ref <15)

## 2023-12-28 LAB — PROTIME-INR
INR: 1 (ref 0.8–1.2)
Prothrombin Time: 13.9 s (ref 11.4–15.2)

## 2023-12-28 LAB — APTT: aPTT: 27 s (ref 24–36)

## 2023-12-28 MED ORDER — IOHEXOL 350 MG/ML SOLN
75.0000 mL | Freq: Once | INTRAVENOUS | Status: AC | PRN
  Administered 2023-12-28: 75 mL via INTRAVENOUS

## 2023-12-28 NOTE — ED Notes (Signed)
 Wife at bedside with pt. Pt calm and alert. No acute distress. Denies any headache or dizziness. Pt seems to be back at baseline, able to identify a glove and a watch. Gave me his age by show 4 & 3 finger 2x. He wife asked him how much is 4+3, he held up 7 fingers.

## 2023-12-28 NOTE — ED Notes (Signed)
EEG in process

## 2023-12-28 NOTE — ED Provider Notes (Signed)
 Signed out that if/when MRI neg for acute cva, the plan is to d/c to home.   MRI is neg for acute cva.   Pt is fully awake, and alert, smiling, conversant, without specific physical complaint.   Pt currently appears stable for ED d/c per Dr Messick's plan.      Bernard Drivers, MD 12/28/23 2111

## 2023-12-28 NOTE — ED Triage Notes (Signed)
 Pt bibems from home. LKW 0830. R side gaze, pin point pupils, response to painful stimuli on both sides. On scene pt was unable to lift R arm. No thinners.

## 2023-12-28 NOTE — ED Notes (Signed)
 Transported to MRI

## 2023-12-28 NOTE — ED Notes (Signed)
 Pt given urinal and voided independently

## 2023-12-28 NOTE — Discharge Instructions (Addendum)
 It was our pleasure to provide your ER care today - we hope that you feel better.  For recent symptoms, follow up closely with primary care doctor and neurologist in the next 1-2 weeks.   Return to ER if worse, new symptoms, fevers, new/severe pain, chest pain, trouble breathing, new numbness/weakness, change in speech or vision, fainting, or other concern.

## 2023-12-28 NOTE — Code Documentation (Signed)
 Stroke Response Nurse Documentation Code Documentation  Jeremy Simon is a 78 y.o. male arriving to [facility]  via [facility] on 12/28/2023 with past medical hx of dementia, HTN, cardiac stent. On aspirin  81 mg daily. Code stroke was activated by EMS.   Patient from home where he was LKW at 0830 when his wife walked him to the bathroom. Later, she found him in the bed unable to speak, not following commands, right gaze. Wife Called EMS who noted some reported right sided weakness and activated a Code Stroke. After speaking with the wife on the phone, LKW changed to last night.   Stroke team at the bedside on patient arrival. Labs drawn and patient cleared for CT by Dr. Laurice. Patient to CT with team. NIHSS 14, see documentation for details and code stroke times. Patient with decreased LOC, disoriented, not following commands, left arm weakness, bilateral leg weakness, Global aphasia , and dysarthria  on exam. The following imaging was completed:  CT Head and CTA. Patient is not a candidate for IV Thrombolytic due to being outside window. Patient is not a candidate for IR due to no LVO per MD Michaela and high mRS.   Care Plan: q2 NIHSS/VS x 12 hours, then q4, MRI.   Process Delays Noted: n/a  Bedside handoff with ED RN Ival Carolee Chiquita GORMAN  Stroke Response RN

## 2023-12-28 NOTE — ED Notes (Signed)
 CCMD called.

## 2023-12-28 NOTE — Consult Note (Signed)
 This am, found with decreased LOC and R gaze 0830  NEUROLOGY CONSULT NOTE   Date of service: December 28, 2023 Patient Name: Jeremy Simon MRN:  [id number] DOB:  1945/08/21 Chief Complaint: CODE STROKE Requesting Provider: [name], MD  History of Present Illness  Jeremy Simon is a 78 y.o. male with hx of alzheimer's dementia, poor functional state with reduced ability to perform ADLs, HLD, HTN, CAD who was BIB EMS as a CODE STROKE due to acute onset of unresponsiveness.  On exam at bridge, patient does not turn to voice, has no verbal output, does not follow commands, does blink to threat bilaterally, no gaze preference, slight drift left arm and bilateral legs. CTH and CTA negative for acute process. CBG WNL and BP 171/77. We spoke further with wife who noted that she got him up at 0830 this morning to help him to the bathroom and he was not talking to her, which is not normal for him. She states he was talking normally to her and seemed in his usual state of health last night before bed, but could not state exact time.   LKW: last night Modified rankin score: 4-Needs assistance to walk and tend to bodily needs IV Thrombolysis: no, outside of window EVT: no, no LVO   NIHSS components Score: Comment  1a Level of Conscious 0[]  1[]  2[x]  3[]      1b LOC Questions 0[]  1[]  2[x]       1c LOC Commands 0[]  1[]  2[x]       2 Best Gaze 0[x]  1[]  2[]       3 Visual 0[x]  1[]  2[]  3[]      4 Facial Palsy 0[x]  1[]  2[]  3[]      5a Motor Arm - left 0[]  1[x]  2[]  3[]  4[]  UN[]    5b Motor Arm - Right 0[x]  1[]  2[]  3[]  4[]  UN[]    6a Motor Leg - Left 0[]  1[x]  2[]  3[]  4[]  UN[]    6b Motor Leg - Right 0[]  1[x]  2[]  3[]  4[]  UN[]    7 Limb Ataxia 0[]  1[]  2[]  UN[]      8 Sensory 0[x]  1[]  2[]  UN[]      9 Best Language 0[]  1[]  2[]  3[x]      10 Dysarthria 0[]  1[]  2[x]  UN[]      11 Extinct. and Inattention 0[]  1[]  2[]       TOTAL:   14      ROS   Unable to ascertain due to unresponsiveness  Past History    Past Medical History:  Diagnosis Date   Anxiety    Arthritis    CAD    Erectile dysfunction    HIATAL HERNIA    HYPERLIPIDEMIA    HYPERTENSION    Numbness in feet     Past Surgical History:  Procedure Laterality Date   CARDIAC CATHETERIZATION     COLONOSCOPY  12/31/2014   Minimal internal hemorrhoids. Otherwise normal colonoscopy   CORONARY STENT INTERVENTION N/A 08/14/2019   Procedure: CORONARY STENT INTERVENTION;  Surgeon: Verlin Lonni BIRCH, MD;  Location: [facility];  Service: Cardiovascular;  Laterality: N/A;   LEFT HEART CATH AND CORONARY ANGIOGRAPHY N/A 08/14/2019   Procedure: LEFT HEART CATH AND CORONARY ANGIOGRAPHY;  Surgeon: Verlin Lonni BIRCH, MD;  Location: [facility];  Service: Cardiovascular;  Laterality: N/A;   NOSE SURGERY     TRANSURETHRAL RESECTION OF PROSTATE  03/2017    Family History: Family History  Problem Relation Age of Onset   Alzheimer's disease Mother  SOME TYPE OF VALUE DISORDER   Heart attack Father    Hypertension Father    Obesity Father    Hypertension Sister    Diabetes Sister    Obesity Sister    Other Daughter        NO HEALTH PROBLEMS   Other Son        NO HEALTH PROBLEMS   Colon cancer Neg Hx    Rectal cancer Neg Hx    Stomach cancer Neg Hx    Esophageal cancer Neg Hx    Pancreatic cancer Neg Hx    Liver cancer Neg Hx     Social History  reports that he has never smoked. He has never used smokeless tobacco. He reports that he does not drink alcohol  and does not use drugs.  Allergies  Allergen Reactions   Altace  [Ramipril ]     Hives, lip swelling   Norvasc  [Amlodipine ] Swelling    Left foot swelling    Medications  No current facility-administered medications for this encounter.  Current Outpatient Medications:    acetaminophen  (TYLENOL ) 500 MG tablet, Take 500 mg by mouth every 6 (six) hours as needed for mild pain or moderate pain., Disp: , Rfl:    aspirin  EC 81 MG tablet, Take  81 mg by mouth daily. Swallow whole., Disp: , Rfl:    atorvastatin  (LIPITOR) 80 MG tablet, Take 1 tablet (80 mg total) by mouth daily., Disp: 90 tablet, Rfl: 3   Azelastine HCl 137 MCG/SPRAY SOLN, Place into both nostrils. AS NEEDED, Disp: , Rfl:    calcium  carbonate (TUMS EX) 750 MG chewable tablet, Chew 1 tablet by mouth daily., Disp: , Rfl:    CHOLECALCIFEROL PO, Take 1 tablet by mouth daily., Disp: , Rfl:    DULoxetine  (CYMBALTA ) 60 MG capsule, Take 1 capsule (60 mg total) by mouth daily., Disp: 90 capsule, Rfl: 3   memantine  (NAMENDA ) 10 MG tablet, TAKE 1 TABLET TWICE A DAY, Disp: 180 tablet, Rfl: 3   METAMUCIL FIBER PO, Take by mouth., Disp: , Rfl:    metoprolol  succinate (TOPROL -XL) 25 MG 24 hr tablet, TAKE 1 TABLET BY MOUTH EVERY DAY, Disp: 90 tablet, Rfl: 3   MYRBETRIQ 50 MG TB24 tablet, Take 50 mg by mouth daily., Disp: , Rfl:    nitroGLYCERIN  (NITROSTAT ) 0.4 MG SL tablet, Place 1 tablet (0.4 mg total) under the tongue every 5 (five) minutes as needed., Disp: 25 tablet, Rfl: 11   Polyethylene Glycol 3350 (MIRALAX PO), Take by mouth as needed., Disp: , Rfl:    tamsulosin (FLOMAX) 0.4 MG CAPS capsule, Take 0.4 mg by mouth daily., Disp: , Rfl:   Vitals   Vitals:   12/28/23 1200  Weight: 85.2 kg  Height: 6' (1.829 m)    Body mass index is 25.47 kg/m.   Physical Exam   Constitutional: Appears elderly and chronically ill.  Cardiovascular: Normal rate and regular rhythm.  Respiratory: Effort normal, non-labored breathing.    Neurologic Examination   Patient is does not follow commands or open eyes, does not turn head or answer to name.  He does withdraw in all extremities to noxious stimuli.   CN: Blinks to threat bilaterally.  No gaze preference, does not track. Face symmetric at rest. Does not respond to voice.  No verbal output.  Tongue midline  LUE: drift present RUE: no drift present BLE: drift present   Labs/Imaging/Neurodiagnostic studies   CBC: No  results for input(s): WBC, NEUTROABS, HGB, HCT, MCV, PLT in the  last 168 hours. Basic Metabolic Panel:  Lab Results  Component Value Date   NA 141 09/05/2019   K 4.9 09/05/2019   CO2 25 09/05/2019   GLUCOSE 95 09/05/2019   BUN 16 09/05/2019   CREATININE 0.85 09/05/2019   CALCIUM  8.9 09/05/2019   GFRNONAA 86 09/05/2019   GFRAA 100 09/05/2019   Lipid Panel:  Lab Results  Component Value Date   LDLCALC 60 10/28/2019   HgbA1c:  Lab Results  Component Value Date   HGBA1C 5.4 12/04/2018     CT Head without contrast(Personally reviewed): No acute intracranial abnormality. Age-related volume loss and moderate periventricular and deep cerebral white matter disease, particularly involving the frontal lobes.  CT angio Head and Neck with contrast(Personally reviewed): Mild stenosis of the right M1 segment. No large vessel occlusion or flow-limiting stenosis.  MRI Brain(Personally reviewed): PENDING  Neurodiagnostics rEEG:  PENDING  I have seen the patient and reviewed the above note.  ASSESSMENT   ALBEN JEPSEN is a 78 y.o. male he remained unresponsive until noxious stimulation was applied by the emergency department physician.  Per his wife, he sometimes has episodes where he does not respond for a little bit but he does then start responding.  The etiology of this event is slightly unclear, seizure could be considered but this is not definite by history by any means.  If you were to continue to have spells, then antiepileptic therapy could be started in the future.  RECOMMENDATIONS  MRI brain EEG If the above are negative and he returns to baseline, could consider discharge home, but if he remains far from baseline, or he has any further episodes of concern, then would favor admission for observation ______________________________________________________________________    Signed, Rocky JAYSON Likes, NP [facility]

## 2023-12-28 NOTE — Procedures (Signed)
 Patient Name: Jeremy Simon  MRN: [id number]  Epilepsy Attending: Arlin MALVA Krebs  Referring Physician/Provider: Judithe Rocky BROCKS, NP  Date: 12/28/2023 Duration: 22.49 mins  Patient history: 78 y.o. male with acute onset of unresponsiveness. EEG to evaluate for seizure  Level of alertness: Awake, drowsy  AEDs during EEG study: None  Technical aspects: This EEG study was done with scalp electrodes positioned according to the 10-20 International system of electrode placement. Electrical activity was reviewed with band pass filter of 1-70Hz , sensitivity of 7 uV/mm, display speed of 35mm/sec with a 60Hz  notched filter applied as appropriate. EEG data were recorded continuously and digitally stored.  Video monitoring was available and reviewed as appropriate.  Description: The posterior dominant rhythm consists of 8 Hz activity of moderate voltage (25-35 uV) seen predominantly in posterior head regions, symmetric and reactive to eye opening and eye closing. Drowsiness was characterized by attenuation of the posterior background rhythm. Physiologic photic driving was not seen during photic stimulation. Hyperventilation was not performed.     IMPRESSION: This study is within normal limits. No seizures or epileptiform discharges were seen throughout the recording.  A normal interictal EEG does not exclude the diagnosis of epilepsy.   Laurinda Carreno O Caulin Begley

## 2023-12-28 NOTE — ED Provider Notes (Signed)
 [place] EMERGENCY DEPARTMENT AT [facility] Provider Note   CSN: [id number] Arrival date & time: 12/28/23  1222  An emergency department physician performed an initial assessment on this suspected stroke patient at 95.  Patient presents with: No chief complaint on file.   Jeremy Simon is a 78 y.o. male.   78 year old male with prior medical history as detailed below.  Patient arrives from home with EMS transport.  Patient with reported change in mentation since this morning.  Patient's wife reports that he was fine last night when she went to bed.  This morning she noticed that he was hard to arouse.  He was due for a haircut around 11 AM.  He would not wake up to go to the barber.  Given his change in mental status she called EMS.  EMS was concerned about possible stroke.  Code stroke initiated in the field.  Neuroteam evaluated the patient on arrival.  They do not feel he meets criteria for acute neurologic intervention.  On my evaluation in the ED, patient is lying with his eyes closed.  He does not respond to verbal stimulation.  With painful stimulation he immediately opened his eyes and told me to stop.  He is able to answer questions appropriately.  He appears to be oriented appropriately.  He does not have acute neurologic abnormality on my evaluation.  The history is provided by the patient and medical records.       Prior to Admission medications   Medication Sig Start Date End Date Taking? Authorizing Provider  acetaminophen  (TYLENOL ) 500 MG tablet Take 500 mg by mouth every 6 (six) hours as needed for mild pain or moderate pain.    [provider]  aspirin  EC 81 MG tablet Take 81 mg by mouth daily. Swallow whole.    [provider]  atorvastatin  (LIPITOR) 80 MG tablet Take 1 tablet (80 mg total) by mouth daily. 08/07/19   Pietro Redell RAMAN, MD  Azelastine HCl 137 MCG/SPRAY SOLN Place into both nostrils. AS NEEDED 07/13/21   [provider]  calcium  carbonate (TUMS EX) 750 MG chewable tablet Chew 1 tablet by mouth daily.    [provider]  CHOLECALCIFEROL PO Take 1 tablet by mouth daily. 04/08/20   [provider]  DULoxetine  (CYMBALTA ) 60 MG capsule Take 1 capsule (60 mg total) by mouth daily. 02/28/22   Onita Duos, MD  memantine  (NAMENDA ) 10 MG tablet TAKE 1 TABLET TWICE A DAY 09/14/23   Gayland Lauraine PARAS, NP  METAMUCIL FIBER PO Take by mouth.    [provider]  metoprolol  succinate (TOPROL -XL) 25 MG 24 hr tablet TAKE 1 TABLET BY MOUTH EVERY DAY 04/05/19   Pietro Redell RAMAN, MD  MYRBETRIQ 50 MG TB24 tablet Take 50 mg by mouth daily. 06/14/21   [provider]  nitroGLYCERIN  (NITROSTAT ) 0.4 MG SL tablet Place 1 tablet (0.4 mg total) under the tongue every 5 (five) minutes as needed. 03/06/23   Pietro Redell RAMAN, MD  Polyethylene Glycol 3350 (MIRALAX PO) Take by mouth as needed.    [provider]  tamsulosin (FLOMAX) 0.4 MG CAPS capsule Take 0.4 mg by mouth daily. 12/17/19   [provider]    Allergies: Altace  [ramipril ] and Norvasc  [amlodipine ]    Review of Systems  All other systems reviewed and are negative.   Updated Vital Signs BP (!) 165/75   Pulse (!) 59   Resp 15   Ht 6' (1.829 m)  Wt 85.2 kg   SpO2 96%   BMI 25.47 kg/m   Physical Exam Vitals and nursing note reviewed.  Constitutional:      General: He is not in acute distress.    Appearance: Normal appearance. He is well-developed.  HENT:     Head: Normocephalic and atraumatic.  Eyes:     Conjunctiva/sclera: Conjunctivae normal.     Pupils: Pupils are equal, round, and reactive to light.  Cardiovascular:     Rate and Rhythm: Normal rate and regular rhythm.     Heart sounds: Normal heart sounds.  Pulmonary:     Effort: Pulmonary effort is normal. No respiratory distress.     Breath sounds: Normal breath sounds.  Abdominal:     General: There is no distension.     Palpations: Abdomen  is soft.     Tenderness: There is no abdominal tenderness.  Musculoskeletal:        General: No deformity. Normal range of motion.     Cervical back: Normal range of motion and neck supple.  Skin:    General: Skin is warm and dry.  Neurological:     General: No focal deficit present.     Mental Status: He is alert and oriented to person, place, and time. Mental status is at baseline.     Comments: Initially lying with eyes closed.  Refusing to interact.  After painful stimulation the patient responded with why did you do that?SABRA  After painful stimulation, patient is answering questions appropriately, he is neurologically intact.  When his wife arrived he was initially again nonverbal.  I again had to tell the patient that if he did not talk he would have to suffer painful stimulation.  At this point, he began talking to his wife and to myself without difficulty.     (all labs ordered are listed, but only abnormal results are displayed) Labs Reviewed  I-STAT CHEM 8, ED - Abnormal; Notable for the following components:      Result Value   Calcium , Ion 1.09 (*)    Hemoglobin 12.6 (*)    HCT 37.0 (*)    All other components within normal limits  CBG MONITORING, ED - Abnormal; Notable for the following components:   Glucose-Capillary 103 (*)    All other components within normal limits  PROTIME-INR  APTT  CBC  DIFFERENTIAL  ETHANOL  COMPREHENSIVE METABOLIC PANEL WITH GFR  RAPID URINE DRUG SCREEN, HOSP PERFORMED  URINALYSIS, W/ REFLEX TO CULTURE (INFECTION SUSPECTED)    EKG: None  Radiology: CT ANGIO HEAD NECK W WO CM (CODE STROKE) Result Date: 12/28/2023 EXAM: CTA HEAD AND NECK WITHOUT AND WITH 12/28/2023 12:30:55 PM TECHNIQUE: CTA of the head and neck was performed with and without the administration of intravenous contrast. Multiplanar 2D and/or 3D reformatted images are provided for review. Automated exposure control, iterative reconstruction, and/or weight based  adjustment of the mA/kV was utilized to reduce the radiation dose to as low as reasonably achievable. Stenosis of the internal carotid arteries measured using NASCET criteria. COMPARISON: CT of the head dated 12/28/2023. CLINICAL HISTORY: Neuro deficit, acute, stroke suspected. Code stroke. Dr. Michaela [phone]; Unable to follow commands, responds only to painful stimuli, rt fixed gaze. FINDINGS: CTA NECK: AORTIC ARCH AND ARCH VESSELS: Mild calcific plaque within the aortic arch. No dissection or arterial injury. No significant stenosis of the brachiocephalic or subclavian arteries. CERVICAL CAROTID ARTERIES: Mild calcific plaque within both carotid bulbs, but no luminal stenosis. No dissection, arterial  injury, or hemodynamically significant stenosis by NASCET criteria. CERVICAL VERTEBRAL ARTERIES: The vertebral arteries are codominant and normal in caliber throughout the respective courses. No dissection, arterial injury, or significant stenosis. LUNGS AND MEDIASTINUM: Unremarkable. SOFT TISSUES: No acute abnormality. BONES: No acute abnormality. CTA HEAD: ANTERIOR CIRCULATION: Mild calcific plaque within the carotid siphons with no flow-limiting stenosis. Mild stenosis of the right M1 segment. No large vessel occlusion or flow-limiting stenosis. No aneurysm. POSTERIOR CIRCULATION: The basilar artery is normal in caliber. No significant stenosis of the posterior cerebral arteries. No significant stenosis of the basilar artery. No significant stenosis of the vertebral arteries. No aneurysm. OTHER: No dural venous sinus thrombosis on this non-dedicated study. The above findings were discussed with Dr. Michaela at 12:38 PM on 12/28/2023. IMPRESSION: 1. Mild stenosis of the right M1 segment. 2. No large vessel occlusion or flow-limiting stenosis. 3. Findings discussed with Dr. Michaela at 12:38 pm on 12/28/2023. Electronically signed by: Evalene Coho MD 12/28/2023 12:46 PM EDT RP Workstation: [id number]    CT HEAD CODE STROKE WO CONTRAST Result Date: 12/28/2023 EXAM: CT HEAD WITHOUT CONTRAST 12/28/2023 12:28:45 PM TECHNIQUE: CT of the head was performed without the administration of intravenous contrast. Automated exposure control, iterative reconstruction, and/or weight based adjustment of the mA/kV was utilized to reduce the radiation dose to as low as reasonably achievable. COMPARISON: Maxillofacial CT dated 08/09/2000. CLINICAL HISTORY: Neuro deficit, acute, stroke suspected. Code stroke. Dr. Michaela [phone]; Unable to follow commands, responds only to painful stimuli, rt fixed gaze. LKW 0830 am FINDINGS: BRAIN AND VENTRICLES: No acute hemorrhage. Gray-white differentiation is preserved. No hydrocephalus. No extra-axial collection. No mass effect or midline shift. Age-related volume loss and moderate periventricular and deep cerebral white matter disease, particularly involving the frontal lobes. Calcific atheromatous disease within the carotid siphons. ORBITS: Patient is status post bilateral lens replacement. SINUSES: Mucosal disease within the paranasal sinuses. SOFT TISSUES AND SKULL: No acute soft tissue abnormality. No skull fracture. [place] stroke program early CT (ASPECT) score: Ganglionic (caudate, IC, lentiform nucleus, insula, M1-M3): 7 Supraganglionic (M4-M6): 3 Total: 10 The above findings were communicated to Dr. Michaela at 12:34 PM on 12/28/2023 via Amion paging. IMPRESSION: 1. No acute intracranial abnormality. 2. Age-related volume loss and moderate periventricular and deep cerebral white matter disease, particularly involving the frontal lobes. Aspects 10/ Electronically signed by: Evalene Coho MD 12/28/2023 12:38 PM EDT RP Workstation: [id number]     Procedures   Medications Ordered in the ED  iohexol  (OMNIPAQUE ) 350 MG/ML injection 75 mL (75 mLs Intravenous Contrast Given 12/28/23 1231)                                    Medical Decision Making Patient with AMS.  Arrived as Code Stroke.  Neuro did not feel that patient required emergent neuro intervention.   MS improved on my evaluation.  UA and MRI Brain pending at time of shift change.   Oncoming EDP aware of case.      Amount and/or Complexity of Data Reviewed Labs: ordered. Radiology: ordered.   CRITICAL CARE Performed by: Maude JAYSON Galloway   Total critical care time: 30 minutes  Critical care time was exclusive of separately billable procedures and treating other patients.  Critical care was necessary to treat or prevent imminent or life-threatening deterioration.  Critical care was time spent personally by me on the following activities: development of treatment plan with patient and/or surrogate  as well as nursing, discussions with consultants, evaluation of patient's response to treatment, examination of patient, obtaining history from patient or surrogate, ordering and performing treatments and interventions, ordering and review of laboratory studies, ordering and review of radiographic studies, pulse oximetry and re-evaluation of patient's condition.      Final diagnoses:  Acute alteration in mental status  History of dementia  Elevated blood pressure reading  Essential hypertension    ED Discharge Orders     None          [name], MD 12/31/23 1755

## 2023-12-28 NOTE — Progress Notes (Signed)
 EEG complete - results pending

## 2023-12-28 NOTE — ED Notes (Signed)
 Upon receiving pt from shift change, pt was saturated in urine with multiple pull-ups on. Sheets soaked, pt lined completely changed. Pt given soda, approved by MD. Family at bedside.

## 2024-01-09 DIAGNOSIS — N401 Enlarged prostate with lower urinary tract symptoms: Secondary | ICD-10-CM | POA: Diagnosis not present

## 2024-01-09 DIAGNOSIS — Z466 Encounter for fitting and adjustment of urinary device: Secondary | ICD-10-CM | POA: Diagnosis not present

## 2024-01-09 DIAGNOSIS — N201 Calculus of ureter: Secondary | ICD-10-CM | POA: Diagnosis not present

## 2024-01-10 DIAGNOSIS — K080 Exfoliation of teeth due to systemic causes: Secondary | ICD-10-CM | POA: Diagnosis not present

## 2024-01-18 DIAGNOSIS — N201 Calculus of ureter: Secondary | ICD-10-CM | POA: Diagnosis not present

## 2024-01-19 DIAGNOSIS — Z79899 Other long term (current) drug therapy: Secondary | ICD-10-CM | POA: Diagnosis not present

## 2024-01-19 DIAGNOSIS — Z7982 Long term (current) use of aspirin: Secondary | ICD-10-CM | POA: Diagnosis not present

## 2024-01-19 DIAGNOSIS — Z792 Long term (current) use of antibiotics: Secondary | ICD-10-CM | POA: Diagnosis not present

## 2024-01-19 DIAGNOSIS — N200 Calculus of kidney: Secondary | ICD-10-CM | POA: Diagnosis not present

## 2024-01-19 DIAGNOSIS — N201 Calculus of ureter: Secondary | ICD-10-CM | POA: Diagnosis not present

## 2024-01-25 DIAGNOSIS — G309 Alzheimer's disease, unspecified: Secondary | ICD-10-CM | POA: Diagnosis not present

## 2024-01-25 DIAGNOSIS — R600 Localized edema: Secondary | ICD-10-CM | POA: Diagnosis not present

## 2024-01-25 DIAGNOSIS — R0609 Other forms of dyspnea: Secondary | ICD-10-CM | POA: Diagnosis not present

## 2024-01-25 DIAGNOSIS — N201 Calculus of ureter: Secondary | ICD-10-CM | POA: Diagnosis not present

## 2024-01-25 DIAGNOSIS — F0280 Dementia in other diseases classified elsewhere without behavioral disturbance: Secondary | ICD-10-CM | POA: Diagnosis not present

## 2024-01-25 DIAGNOSIS — N200 Calculus of kidney: Secondary | ICD-10-CM | POA: Diagnosis not present

## 2024-01-25 DIAGNOSIS — Z960 Presence of urogenital implants: Secondary | ICD-10-CM | POA: Diagnosis not present

## 2024-01-25 DIAGNOSIS — Z23 Encounter for immunization: Secondary | ICD-10-CM | POA: Diagnosis not present

## 2024-01-26 DIAGNOSIS — N201 Calculus of ureter: Secondary | ICD-10-CM | POA: Diagnosis not present

## 2024-01-26 DIAGNOSIS — N401 Enlarged prostate with lower urinary tract symptoms: Secondary | ICD-10-CM | POA: Diagnosis not present

## 2024-03-03 DIAGNOSIS — I2510 Atherosclerotic heart disease of native coronary artery without angina pectoris: Secondary | ICD-10-CM | POA: Diagnosis not present

## 2024-03-03 DIAGNOSIS — S4992XA Unspecified injury of left shoulder and upper arm, initial encounter: Secondary | ICD-10-CM | POA: Diagnosis not present

## 2024-03-03 DIAGNOSIS — M25512 Pain in left shoulder: Secondary | ICD-10-CM | POA: Diagnosis not present

## 2024-03-03 DIAGNOSIS — F0390 Unspecified dementia without behavioral disturbance: Secondary | ICD-10-CM | POA: Diagnosis not present

## 2024-03-03 DIAGNOSIS — E785 Hyperlipidemia, unspecified: Secondary | ICD-10-CM | POA: Diagnosis not present

## 2024-03-03 DIAGNOSIS — W19XXXA Unspecified fall, initial encounter: Secondary | ICD-10-CM | POA: Diagnosis not present

## 2024-03-03 DIAGNOSIS — M25519 Pain in unspecified shoulder: Secondary | ICD-10-CM | POA: Diagnosis not present

## 2024-03-03 DIAGNOSIS — S42202A Unspecified fracture of upper end of left humerus, initial encounter for closed fracture: Secondary | ICD-10-CM | POA: Diagnosis not present

## 2024-03-03 DIAGNOSIS — I10 Essential (primary) hypertension: Secondary | ICD-10-CM | POA: Diagnosis not present

## 2024-03-03 DIAGNOSIS — S42295A Other nondisplaced fracture of upper end of left humerus, initial encounter for closed fracture: Secondary | ICD-10-CM | POA: Diagnosis not present

## 2024-03-03 DIAGNOSIS — Z043 Encounter for examination and observation following other accident: Secondary | ICD-10-CM | POA: Diagnosis not present

## 2024-03-03 DIAGNOSIS — S4980XA Other specified injuries of shoulder and upper arm, unspecified arm, initial encounter: Secondary | ICD-10-CM | POA: Diagnosis not present

## 2024-03-05 ENCOUNTER — Ambulatory Visit: Payer: Medicare Other | Admitting: Neurology

## 2024-03-06 DIAGNOSIS — S42202A Unspecified fracture of upper end of left humerus, initial encounter for closed fracture: Secondary | ICD-10-CM | POA: Diagnosis not present

## 2024-03-07 DIAGNOSIS — F0280 Dementia in other diseases classified elsewhere without behavioral disturbance: Secondary | ICD-10-CM | POA: Diagnosis not present

## 2024-03-07 DIAGNOSIS — G309 Alzheimer's disease, unspecified: Secondary | ICD-10-CM | POA: Diagnosis not present

## 2024-03-07 DIAGNOSIS — S42302A Unspecified fracture of shaft of humerus, left arm, initial encounter for closed fracture: Secondary | ICD-10-CM | POA: Diagnosis not present

## 2024-03-07 DIAGNOSIS — W19XXXD Unspecified fall, subsequent encounter: Secondary | ICD-10-CM | POA: Diagnosis not present

## 2024-03-13 DIAGNOSIS — S42292A Other displaced fracture of upper end of left humerus, initial encounter for closed fracture: Secondary | ICD-10-CM | POA: Diagnosis not present

## 2024-03-14 DIAGNOSIS — N3941 Urge incontinence: Secondary | ICD-10-CM | POA: Diagnosis not present

## 2024-03-14 DIAGNOSIS — N401 Enlarged prostate with lower urinary tract symptoms: Secondary | ICD-10-CM | POA: Diagnosis not present

## 2024-03-14 DIAGNOSIS — N201 Calculus of ureter: Secondary | ICD-10-CM | POA: Diagnosis not present

## 2024-03-21 DIAGNOSIS — S42292A Other displaced fracture of upper end of left humerus, initial encounter for closed fracture: Secondary | ICD-10-CM | POA: Diagnosis not present

## 2024-03-25 DIAGNOSIS — Z79899 Other long term (current) drug therapy: Secondary | ICD-10-CM | POA: Diagnosis not present

## 2024-03-25 DIAGNOSIS — G8918 Other acute postprocedural pain: Secondary | ICD-10-CM | POA: Diagnosis not present

## 2024-03-25 DIAGNOSIS — X58XXXA Exposure to other specified factors, initial encounter: Secondary | ICD-10-CM | POA: Diagnosis not present

## 2024-03-25 DIAGNOSIS — S42252A Displaced fracture of greater tuberosity of left humerus, initial encounter for closed fracture: Secondary | ICD-10-CM | POA: Diagnosis not present

## 2024-03-25 DIAGNOSIS — I2510 Atherosclerotic heart disease of native coronary artery without angina pectoris: Secondary | ICD-10-CM | POA: Diagnosis not present

## 2024-03-25 DIAGNOSIS — I10 Essential (primary) hypertension: Secondary | ICD-10-CM | POA: Diagnosis not present

## 2024-03-25 DIAGNOSIS — S42292A Other displaced fracture of upper end of left humerus, initial encounter for closed fracture: Secondary | ICD-10-CM | POA: Diagnosis not present

## 2024-03-25 DIAGNOSIS — G479 Sleep disorder, unspecified: Secondary | ICD-10-CM | POA: Diagnosis not present

## 2024-03-25 DIAGNOSIS — S42202A Unspecified fracture of upper end of left humerus, initial encounter for closed fracture: Secondary | ICD-10-CM | POA: Diagnosis not present

## 2024-03-25 DIAGNOSIS — Z7982 Long term (current) use of aspirin: Secondary | ICD-10-CM | POA: Diagnosis not present

## 2024-03-25 DIAGNOSIS — E7800 Pure hypercholesterolemia, unspecified: Secondary | ICD-10-CM | POA: Diagnosis not present

## 2024-03-25 DIAGNOSIS — Z888 Allergy status to other drugs, medicaments and biological substances status: Secondary | ICD-10-CM | POA: Diagnosis not present

## 2024-03-26 DIAGNOSIS — E7800 Pure hypercholesterolemia, unspecified: Secondary | ICD-10-CM | POA: Diagnosis not present

## 2024-03-26 DIAGNOSIS — G479 Sleep disorder, unspecified: Secondary | ICD-10-CM | POA: Diagnosis not present

## 2024-03-26 DIAGNOSIS — X58XXXA Exposure to other specified factors, initial encounter: Secondary | ICD-10-CM | POA: Diagnosis not present

## 2024-03-26 DIAGNOSIS — I2510 Atherosclerotic heart disease of native coronary artery without angina pectoris: Secondary | ICD-10-CM | POA: Diagnosis not present

## 2024-03-26 DIAGNOSIS — Z79899 Other long term (current) drug therapy: Secondary | ICD-10-CM | POA: Diagnosis not present

## 2024-03-26 DIAGNOSIS — Z888 Allergy status to other drugs, medicaments and biological substances status: Secondary | ICD-10-CM | POA: Diagnosis not present

## 2024-03-26 DIAGNOSIS — S42292A Other displaced fracture of upper end of left humerus, initial encounter for closed fracture: Secondary | ICD-10-CM | POA: Diagnosis not present

## 2024-03-26 DIAGNOSIS — I10 Essential (primary) hypertension: Secondary | ICD-10-CM | POA: Diagnosis not present

## 2024-03-26 DIAGNOSIS — Z7982 Long term (current) use of aspirin: Secondary | ICD-10-CM | POA: Diagnosis not present

## 2024-04-09 DIAGNOSIS — S42292A Other displaced fracture of upper end of left humerus, initial encounter for closed fracture: Secondary | ICD-10-CM | POA: Diagnosis not present

## 2024-04-17 DIAGNOSIS — S42292A Other displaced fracture of upper end of left humerus, initial encounter for closed fracture: Secondary | ICD-10-CM | POA: Diagnosis not present

## 2024-04-17 DIAGNOSIS — Z7982 Long term (current) use of aspirin: Secondary | ICD-10-CM | POA: Diagnosis not present

## 2024-04-17 DIAGNOSIS — M25512 Pain in left shoulder: Secondary | ICD-10-CM | POA: Diagnosis not present

## 2024-04-17 DIAGNOSIS — Z741 Need for assistance with personal care: Secondary | ICD-10-CM | POA: Diagnosis not present

## 2024-04-17 DIAGNOSIS — Z79899 Other long term (current) drug therapy: Secondary | ICD-10-CM | POA: Diagnosis not present

## 2024-04-17 DIAGNOSIS — Z87442 Personal history of urinary calculi: Secondary | ICD-10-CM | POA: Diagnosis not present

## 2024-04-17 DIAGNOSIS — F02818 Dementia in other diseases classified elsewhere, unspecified severity, with other behavioral disturbance: Secondary | ICD-10-CM | POA: Diagnosis not present

## 2024-04-17 DIAGNOSIS — M1990 Unspecified osteoarthritis, unspecified site: Secondary | ICD-10-CM | POA: Diagnosis not present

## 2024-04-17 DIAGNOSIS — Z888 Allergy status to other drugs, medicaments and biological substances status: Secondary | ICD-10-CM | POA: Diagnosis not present

## 2024-04-17 DIAGNOSIS — N400 Enlarged prostate without lower urinary tract symptoms: Secondary | ICD-10-CM | POA: Diagnosis not present

## 2024-04-17 DIAGNOSIS — G301 Alzheimer's disease with late onset: Secondary | ICD-10-CM | POA: Diagnosis not present

## 2024-04-17 DIAGNOSIS — G629 Polyneuropathy, unspecified: Secondary | ICD-10-CM | POA: Diagnosis not present

## 2024-04-17 DIAGNOSIS — T8142XA Infection following a procedure, deep incisional surgical site, initial encounter: Secondary | ICD-10-CM | POA: Diagnosis not present

## 2024-04-17 DIAGNOSIS — E7800 Pure hypercholesterolemia, unspecified: Secondary | ICD-10-CM | POA: Diagnosis not present

## 2024-04-17 DIAGNOSIS — I2510 Atherosclerotic heart disease of native coronary artery without angina pectoris: Secondary | ICD-10-CM | POA: Diagnosis not present

## 2024-04-17 DIAGNOSIS — Z8673 Personal history of transient ischemic attack (TIA), and cerebral infarction without residual deficits: Secondary | ICD-10-CM | POA: Diagnosis not present

## 2024-04-17 DIAGNOSIS — N3281 Overactive bladder: Secondary | ICD-10-CM | POA: Diagnosis not present

## 2024-04-17 DIAGNOSIS — B957 Other staphylococcus as the cause of diseases classified elsewhere: Secondary | ICD-10-CM | POA: Diagnosis not present

## 2024-04-17 DIAGNOSIS — Z79891 Long term (current) use of opiate analgesic: Secondary | ICD-10-CM | POA: Diagnosis not present

## 2024-04-17 DIAGNOSIS — I10 Essential (primary) hypertension: Secondary | ICD-10-CM | POA: Diagnosis not present

## 2024-04-24 ENCOUNTER — Telehealth: Payer: Self-pay

## 2024-04-24 DIAGNOSIS — Y829 Unspecified medical devices associated with adverse incidents: Secondary | ICD-10-CM | POA: Diagnosis not present

## 2024-04-24 DIAGNOSIS — T8142XA Infection following a procedure, deep incisional surgical site, initial encounter: Secondary | ICD-10-CM | POA: Diagnosis not present

## 2024-04-24 DIAGNOSIS — Z792 Long term (current) use of antibiotics: Secondary | ICD-10-CM | POA: Diagnosis not present

## 2024-04-24 NOTE — Transitions of Care (Post Inpatient/ED Visit) (Signed)
° °  04/24/2024  Name: Jeremy Simon MRN: [id number] DOB: December 13, 1945  Today's TOC FU Call Status: Today's TOC FU Call Status:: Unsuccessful Call (1st Attempt) Unsuccessful Call (1st Attempt) Date: 04/24/24  Attempted to reach the patient regarding the most recent Inpatient/ED visit.  Follow Up Plan: Additional outreach attempts will be made to reach the patient to complete the Transitions of Care (Post Inpatient/ED visit) call.   Shona Prow RN, CCM [place]  VBCI-Population Health RN Care Manager [phone]

## 2024-04-25 ENCOUNTER — Telehealth: Payer: Self-pay

## 2024-04-25 NOTE — Transitions of Care (Post Inpatient/ED Visit) (Signed)
° °  04/25/2024  Name: Jeremy Simon MRN: [id number] DOB: 03/26/1946  Today's TOC FU Call Status: Today's TOC FU Call Status:: Successful TOC FU Call Completed TOC FU Call Complete Date: 04/25/24 (spoke briefly with wife who states patient already has home health and pharmacy supplying the medication and doctors and denied need for [facility] program)  Patient's Name and Date of Birth confirmed. DOB, Name  Shona Prow RN, CCM [place]  VBCI-Population Health RN Care Manager [phone]

## 2024-05-14 ENCOUNTER — Ambulatory Visit: Admitting: Neurology

## 2024-05-23 ENCOUNTER — Other Ambulatory Visit: Payer: Self-pay

## 2024-05-23 ENCOUNTER — Encounter: Payer: Self-pay | Admitting: Internal Medicine

## 2024-05-23 ENCOUNTER — Ambulatory Visit: Admitting: Internal Medicine

## 2024-05-23 VITALS — BP 136/83 | HR 80 | Temp 97.6°F | Ht 73.0 in | Wt 186.0 lb

## 2024-05-23 DIAGNOSIS — B957 Other staphylococcus as the cause of diseases classified elsewhere: Secondary | ICD-10-CM

## 2024-05-23 DIAGNOSIS — T847XXA Infection and inflammatory reaction due to other internal orthopedic prosthetic devices, implants and grafts, initial encounter: Secondary | ICD-10-CM

## 2024-05-23 DIAGNOSIS — B958 Unspecified staphylococcus as the cause of diseases classified elsewhere: Secondary | ICD-10-CM

## 2024-05-23 MED ORDER — CEFADROXIL 500 MG PO CAPS: 1000.0000 mg | ORAL_CAPSULE | Freq: Two times a day (BID) | ORAL | 0 refills | Status: AC

## 2024-05-23 NOTE — Progress Notes (Signed)
 "    RFV: new patient, of dr larnell. For left arm HW infection, staph lugdunensis  Patient ID: Jeremy Simon, male   DOB: 15-Mar-1946, 79 y.o.   MRN: [id number]  HPI Divit is a 79yoM with left arm hardware infection, cultures grew staph lugdunensis. He finishes iv abtx in 7 days, next wed. Currently on cefazolin  plus rifampin to finish out 6 wk course of treatment. He reports that his arm is healing slowly. He is tolerating his abx. Reviewed records from dr yaste.  Outpatient Encounter Medications as of 05/23/2024  Medication Sig   atorvastatin  (LIPITOR) 80 MG tablet Take 1 tablet (80 mg total) by mouth daily.   ceFAZolin  (ANCEF ) 10 g injection    DULoxetine  (CYMBALTA ) 60 MG capsule Take 1 capsule (60 mg total) by mouth daily.   levocetirizine (XYZAL ALLERGY 24HR) 5 MG tablet Take 2.5 mg by mouth every evening.   memantine  (NAMENDA ) 10 MG tablet TAKE 1 TABLET TWICE A DAY   metoprolol  succinate (TOPROL -XL) 25 MG 24 hr tablet TAKE 1 TABLET BY MOUTH EVERY DAY   nitroGLYCERIN  (NITROSTAT ) 0.4 MG SL tablet Place 1 tablet (0.4 mg total) under the tongue every 5 (five) minutes as needed.   polyethylene glycol powder (GLYCOLAX/MIRALAX) 17 GM/SCOOP powder Take 17 g by mouth daily as needed (constipation).   rifampin (RIFADIN) 300 MG capsule Take 300 mg by mouth 2 (two) times daily.   tamsulosin (FLOMAX) 0.4 MG CAPS capsule Take 0.4 mg by mouth every evening.   aspirin  EC 81 MG tablet Take 81 mg by mouth daily. Swallow whole. (Patient not taking: Reported on 05/23/2024)   No facility-administered encounter medications on file as of 05/23/2024.     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   Acquired hallux valgus, right 08/16/2016   Hypertrophy of bone, right ankle and foot 08/16/2016   Lumbar radiculopathy 07/13/2016   Abnormal stress echocardiogram 05/21/2014   CHEST PAIN 01/30/2009   HYPERLIPIDEMIA 01/28/2009   Essential hypertension 01/28/2009   Coronary atherosclerosis 01/28/2009   Diaphragmatic hernia 01/28/2009     Health Maintenance Due  Topic Date Due   Zoster Vaccines- Shingrix (1 of 2) Never done   DTaP/Tdap/Td (1 - Tdap) 05/11/2011   Pneumococcal Vaccine: 50+ Years (2 of 2 - PPSV23, PCV20, or PCV21) 10/28/2013   Influenza Vaccine  12/08/2023   COVID-19 Vaccine (7 - 2025-26 season) 01/08/2024     Review of Systems 12 point ros is otherwise negative Physical Exam   BP 136/83   Pulse 80   Temp 97.6 F (36.4 C) (Temporal)   Ht 6' 1 (1.854 m)   Wt 186 lb (84.4 kg)   SpO2 98%   BMI 24.54 kg/m   Physical Exam  Constitutional: He is oriented to person, place, and time. He appears well-developed and well-nourished. No distress.  HENT:  Mouth/Throat: Oropharynx is clear and moist. No oropharyngeal exudate.  Cardiovascular: Normal rate, regular rhythm and normal heart sounds. Exam reveals no gallop and no friction rub.  No murmur heard.  Pulmonary/Chest: Effort normal and  breath sounds normal. No respiratory distress. He has no wheezes.  Zku:ozqu arm in sling Neurological: He is alert and oriented to person, place, and time.  Skin: Skin is warm and dry. No rash noted. No erythema.  Psychiatric: He has a normal mood and affect. His behavior is normal.   Lab Results  Component Value Date   LABRPR Non Reactive 01/29/2020    CBC Lab Results  Component Value Date   WBC 5.6 12/28/2023   RBC 4.48 12/28/2023   HGB 12.6 (L) 12/28/2023   HCT 37.0 (L) 12/28/2023   PLT 195 12/28/2023   MCV 91.7 12/28/2023   MCH 30.8 12/28/2023    MCHC 33.6 12/28/2023   RDW 12.8 12/28/2023   LYMPHSABS 1.3 12/28/2023   MONOABS 0.5 12/28/2023   EOSABS 0.2 12/28/2023    BMET Lab Results  Component Value Date   NA 142 12/28/2023   K 4.7 12/28/2023   CL 107 12/28/2023   CO2 24 12/28/2023   GLUCOSE 99 12/28/2023   BUN 14 12/28/2023   CREATININE 0.90 12/28/2023   CALCIUM  8.8 (L) 12/28/2023   GFRNONAA >60 12/28/2023   GFRAA 100 09/05/2019    Assessment and Plan Staph lugdnensis (oxa S) hardware infection = Finish iv abtx;Will pull picc line next week on 1/22 Needs labs of cbc, bmp, sed rate and crp to be checked by home health this week. Sent in order Start cefadroxil  1000mg  BID on 1/23 See back in 3 wk   "

## 2024-05-24 ENCOUNTER — Telehealth: Payer: Self-pay

## 2024-05-24 NOTE — Telephone Encounter (Signed)
 Per Dr. Luiz end date to stop IV abx and pull picc after last dose is on 05/30/24 and add a sed rate and crp to next blood draw. Faxed written orders over to [facility] and received confirmation.

## 2024-06-20 ENCOUNTER — Ambulatory Visit: Payer: Self-pay | Admitting: Internal Medicine
